# Patient Record
Sex: Female | Born: 1955 | Race: Black or African American | Hispanic: No | Marital: Married | Smoking: Never smoker
Health system: Southern US, Community
[De-identification: ages and names within clinical notes are randomized; demographics above are authoritative.]

## PROBLEM LIST (undated history)

## (undated) DIAGNOSIS — F419 Anxiety disorder, unspecified: Secondary | ICD-10-CM

## (undated) DIAGNOSIS — F32A Depression, unspecified: Secondary | ICD-10-CM

## (undated) DIAGNOSIS — M1990 Unspecified osteoarthritis, unspecified site: Secondary | ICD-10-CM

## (undated) DIAGNOSIS — E785 Hyperlipidemia, unspecified: Secondary | ICD-10-CM

## (undated) DIAGNOSIS — K8020 Calculus of gallbladder without cholecystitis without obstruction: Secondary | ICD-10-CM

## (undated) DIAGNOSIS — M069 Rheumatoid arthritis, unspecified: Secondary | ICD-10-CM

## (undated) DIAGNOSIS — I10 Essential (primary) hypertension: Secondary | ICD-10-CM

## (undated) DIAGNOSIS — F329 Major depressive disorder, single episode, unspecified: Secondary | ICD-10-CM

## (undated) DIAGNOSIS — J849 Interstitial pulmonary disease, unspecified: Secondary | ICD-10-CM

## (undated) HISTORY — DX: Essential (primary) hypertension: I10

## (undated) HISTORY — DX: Major depressive disorder, single episode, unspecified: F32.9

## (undated) HISTORY — DX: Interstitial pulmonary disease, unspecified: J84.9

## (undated) HISTORY — DX: Depression, unspecified: F32.A

## (undated) HISTORY — DX: Anxiety disorder, unspecified: F41.9

## (undated) HISTORY — DX: Hyperlipidemia, unspecified: E78.5

## (undated) HISTORY — DX: Rheumatoid arthritis, unspecified: M06.9

---

## 2008-01-22 ENCOUNTER — Emergency Department (HOSPITAL_COMMUNITY): Admission: EM | Admit: 2008-01-22 | Discharge: 2008-01-22 | Payer: Self-pay | Admitting: Emergency Medicine

## 2010-06-03 ENCOUNTER — Emergency Department (HOSPITAL_COMMUNITY)
Admission: EM | Admit: 2010-06-03 | Discharge: 2010-06-03 | Disposition: A | Discharge status: Home / Self Care | Payer: Managed Care, Other (non HMO) | Attending: Emergency Medicine | Admitting: Emergency Medicine

## 2010-06-03 DIAGNOSIS — R059 Cough, unspecified: Secondary | ICD-10-CM | POA: Insufficient documentation

## 2010-06-03 DIAGNOSIS — J069 Acute upper respiratory infection, unspecified: Secondary | ICD-10-CM | POA: Insufficient documentation

## 2010-06-03 DIAGNOSIS — R05 Cough: Secondary | ICD-10-CM | POA: Insufficient documentation

## 2011-02-23 ENCOUNTER — Other Ambulatory Visit: Payer: Self-pay | Admitting: Family Medicine

## 2011-02-23 ENCOUNTER — Ambulatory Visit
Admission: RE | Admit: 2011-02-23 | Discharge: 2011-02-23 | Disposition: A | Discharge status: Home / Self Care | Payer: Managed Care, Other (non HMO) | Source: Ambulatory Visit | Attending: Family Medicine | Admitting: Family Medicine

## 2011-02-23 DIAGNOSIS — R52 Pain, unspecified: Secondary | ICD-10-CM

## 2011-02-23 DIAGNOSIS — R609 Edema, unspecified: Secondary | ICD-10-CM

## 2012-06-19 DIAGNOSIS — R413 Other amnesia: Secondary | ICD-10-CM

## 2012-06-19 DIAGNOSIS — F4323 Adjustment disorder with mixed anxiety and depressed mood: Secondary | ICD-10-CM

## 2013-09-11 ENCOUNTER — Other Ambulatory Visit (HOSPITAL_COMMUNITY): Payer: Managed Care, Other (non HMO) | Attending: Psychiatry | Admitting: Psychiatry

## 2013-09-11 ENCOUNTER — Encounter (HOSPITAL_COMMUNITY): Payer: Self-pay

## 2013-09-11 DIAGNOSIS — M129 Arthropathy, unspecified: Secondary | ICD-10-CM | POA: Insufficient documentation

## 2013-09-11 DIAGNOSIS — F332 Major depressive disorder, recurrent severe without psychotic features: Secondary | ICD-10-CM | POA: Insufficient documentation

## 2013-09-11 DIAGNOSIS — F410 Panic disorder [episodic paroxysmal anxiety] without agoraphobia: Secondary | ICD-10-CM

## 2013-09-11 DIAGNOSIS — F411 Generalized anxiety disorder: Secondary | ICD-10-CM | POA: Insufficient documentation

## 2013-09-11 DIAGNOSIS — IMO0001 Reserved for inherently not codable concepts without codable children: Secondary | ICD-10-CM | POA: Insufficient documentation

## 2013-09-11 DIAGNOSIS — G4700 Insomnia, unspecified: Secondary | ICD-10-CM | POA: Insufficient documentation

## 2013-09-11 DIAGNOSIS — I10 Essential (primary) hypertension: Secondary | ICD-10-CM | POA: Insufficient documentation

## 2013-09-11 DIAGNOSIS — F339 Major depressive disorder, recurrent, unspecified: Secondary | ICD-10-CM

## 2013-09-11 MED ORDER — MIRTAZAPINE 15 MG PO TABS: 15.0000 mg | ORAL_TABLET | Freq: Every day | ORAL | Status: DC

## 2013-09-11 NOTE — Progress Notes (Unsigned)
    Daily Group Progress Note  Program: IOP  Group Time: 9:00-10:30  Participation Level: Active  Behavioral Response: Appropriate  Type of Therapy:  Group Therapy  Summary of Progress: Pt. Met with psychiatrist and case manager.     Group Time: 10:30-12:00  Participation Level:  Active  Behavioral Response: Appropriate  Type of Therapy: Psycho-education Group  Summary of Progress: Pt. Met with Pt. And case Freight forwarder.  Nancie Neas, COUNS

## 2013-09-11 NOTE — Progress Notes (Deleted)
Ruth Brown is a 58 y.o., married, African American female, who was referred by a previous MH-IOP patient.

## 2013-09-12 ENCOUNTER — Other Ambulatory Visit (HOSPITAL_COMMUNITY): Payer: Managed Care, Other (non HMO) | Admitting: Psychiatry

## 2013-09-12 ENCOUNTER — Encounter (HOSPITAL_COMMUNITY): Payer: Self-pay | Admitting: Psychiatry

## 2013-09-12 DIAGNOSIS — F339 Major depressive disorder, recurrent, unspecified: Secondary | ICD-10-CM
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
## 2013-09-12 NOTE — Progress Notes (Signed)
    Daily Group Progress Note  Program: IOP  Group Time: 9:00-10:30  Participation Level: Active  Behavioral Response: Appropriate  Type of Therapy:  Group Therapy  Summary of Progress: Pt. Smiled and laughed appropriately. Pt. Discussed family history of mental illness, efforts she has made to understand her family and manage her depression.     Group Time: 10:30-12:00  Participation Level:  Active  Behavioral Response: Appropriate  Type of Therapy: Psycho-education Group  Summary of Progress: Pt. Discussed use of self-compassion and watched Clovia Cuff video.  [facility]

## 2013-09-15 ENCOUNTER — Other Ambulatory Visit (HOSPITAL_COMMUNITY): Payer: Managed Care, Other (non HMO) | Admitting: Psychiatry

## 2013-09-15 DIAGNOSIS — F339 Major depressive disorder, recurrent, unspecified: Secondary | ICD-10-CM

## 2013-09-15 NOTE — Progress Notes (Signed)
    Daily Group Progress Note  Program: IOP  Group Time: 0900-1030  Participation Level: Active  Behavioral Response: Appropriate  Type of Therapy:  Process Group  Summary of Progress: Patient voiced how her stressors has affected her both mentally and physically.  Patient states she has determined that she will put herself first.  "I need to work on me." Patient was very tearful at the beginning of group.     Group Time: 1045-1200  Participation Level:  Active  Behavioral Response: Appropriate  Type of Therapy: Psycho-education Group  Summary of Progress: Discussed setting treatment goals, assessing current resources; while exploring new ones, safety plans once discharged, and arranging aftercare plans.       [facility]

## 2013-09-16 ENCOUNTER — Other Ambulatory Visit (HOSPITAL_COMMUNITY): Payer: Managed Care, Other (non HMO) | Admitting: Psychiatry

## 2013-09-16 ENCOUNTER — Encounter (HOSPITAL_COMMUNITY): Payer: Self-pay | Admitting: Psychiatry

## 2013-09-16 DIAGNOSIS — F339 Major depressive disorder, recurrent, unspecified: Secondary | ICD-10-CM

## 2013-09-17 ENCOUNTER — Encounter (HOSPITAL_COMMUNITY): Payer: Self-pay | Admitting: Psychiatry

## 2013-09-17 ENCOUNTER — Other Ambulatory Visit (HOSPITAL_COMMUNITY): Payer: Managed Care, Other (non HMO) | Admitting: Psychiatry

## 2013-09-17 NOTE — Progress Notes (Signed)
    Daily Group Progress Note  Program: IOP  Group Time: 9:00-10:30  Participation Level: Active  Behavioral Response: Appropriate  Type of Therapy:  Group Therapy  Summary of Progress: Pt. Reported that she was in the process of accepting her depression diagnosis. Pt. Also discussed progress that she has made in accepting her mother and creating healthy boundaries in her relationship with her mother.     Group Time:  10:30-12:00  Participation Level:  Active  Behavioral Response: Appropriate  Type of Therapy: Psycho-education Group  Summary of Progress: Pt. Participated in discussion about identifying cognitive distortions and rewriting them with more accurate statements.  Shaune Pollack, COUNS

## 2013-09-17 NOTE — Progress Notes (Signed)
    Daily Group Progress Note  Program: IOP  Group Time: 9:00-10:30  Participation Level: Active  Behavioral Response: Appropriate  Type of Therapy:  Group Therapy  Summary of Progress: Pt. Continues to demonstrate positive attitude toward the group therapy process, learning about depression, thought processes, and behavioral changes that she can make to support her mood. Pt. Shared daughter's poetry with the group.     Group Time: 10:30-12:00  Participation Level:  Active  Behavioral Response: Appropriate  Type of Therapy: Psycho-education Group  Summary of Progress: Pt. Was alert and attentive during grief and loss facilitated by Theda Belfast.  Shaune Pollack, COUNS

## 2013-09-18 ENCOUNTER — Other Ambulatory Visit (HOSPITAL_COMMUNITY): Payer: Managed Care, Other (non HMO) | Admitting: Psychiatry

## 2013-09-18 DIAGNOSIS — F339 Major depressive disorder, recurrent, unspecified: Secondary | ICD-10-CM

## 2013-09-19 ENCOUNTER — Encounter (HOSPITAL_COMMUNITY): Payer: Self-pay | Admitting: Psychiatry

## 2013-09-19 ENCOUNTER — Other Ambulatory Visit (HOSPITAL_COMMUNITY): Payer: Managed Care, Other (non HMO) | Admitting: Psychiatry

## 2013-09-19 ENCOUNTER — Telehealth (HOSPITAL_COMMUNITY): Payer: Self-pay | Admitting: Psychiatry

## 2013-09-19 NOTE — Progress Notes (Signed)
    Daily Group Progress Note  Program: IOP  Group Time: 9:00-10:30  Participation Level: Active  Behavioral Response: Appropriate  Type of Therapy:  Group Therapy  Summary of Progress: Pt. Participated in meditation practice. Pt. Reported that she was feeling "good" with thumbs up. Pt. Reported that her sleep medications have started to work and she is relieved to be sleeping well.      Group Time: 10:30-12:00  Participation Level:  Active  Behavioral Response: Appropriate  Type of Therapy: Psycho-education Group  Summary of Progress: Pt. Was alert and attentive during discussion about developing sleep hygiene behaviors and the importance of sleep to mental health.  [name], [name], COUNS
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
## 2013-09-22 ENCOUNTER — Encounter (HOSPITAL_COMMUNITY): Payer: Self-pay | Admitting: Psychiatry

## 2013-09-22 ENCOUNTER — Other Ambulatory Visit (HOSPITAL_COMMUNITY): Payer: Managed Care, Other (non HMO) | Admitting: Psychiatry

## 2013-09-22 DIAGNOSIS — F339 Major depressive disorder, recurrent, unspecified: Secondary | ICD-10-CM

## 2013-09-22 NOTE — Progress Notes (Signed)
    Daily Group Progress Note  Program: IOP  Group Time: 9:00-10:30  Participation Level: Active  Behavioral Response: Appropriate  Type of Therapy:  Group Therapy  Summary of Progress: Pt. Participated in meditation exercise. Pt. Shared thumbs up that she was feeling "happy". Pt. Shared that she was continuing to set healthy relationship boundaries with her son and grieve the loss related to his imprisonment.     Group Time: 10:30-12:00  Participation Level:  Active  Behavioral Response: Appropriate  Type of Therapy: Psycho-education Group  Summary of Progress: Pt. Participated in discussion about worksheet "just for today..the choice is mine."  Home DepotBrown, SCANA CorporationJennifer Brown, [address] Tilgh[address] RoadOUNS

## 2013-09-23 ENCOUNTER — Other Ambulatory Visit (HOSPITAL_COMMUNITY): Payer: Managed Care, Other (non HMO) | Admitting: Psychiatry

## 2013-09-23 ENCOUNTER — Encounter (HOSPITAL_COMMUNITY): Payer: Self-pay | Admitting: Psychiatry

## 2013-09-23 NOTE — Progress Notes (Signed)
    Daily Group Progress Note  Program: IOP  Group Time: 9:00-10:30  Participation Level: Active  Behavioral Response: Appropriate  Type of Therapy:  Group Therapy  Summary of Progress: Pt. Shared that she was feeling "pretty good". Pt. Stated that she was feeling some anxiety about discharge the end of the week and returning to work. Pt. Continues to focus on understanding the nature of her depression and focusing on positive coping skills such as cooking, cleaning, and improving her communication with her family.     Group Time: 10:30-12:00  Participation Level:  Active  Behavioral Response: Appropriate  Type of Therapy: Psycho-education Group  Summary of Progress: Pt. Was alert and attentive during grief and loss group facilitated by Theda BelfastBob Hamilton.  [name], [name], COUNS

## 2013-09-24 ENCOUNTER — Encounter (HOSPITAL_COMMUNITY): Payer: Self-pay | Admitting: Psychiatry

## 2013-09-24 ENCOUNTER — Other Ambulatory Visit (HOSPITAL_COMMUNITY): Payer: Managed Care, Other (non HMO) | Admitting: Psychiatry

## 2013-09-24 DIAGNOSIS — F339 Major depressive disorder, recurrent, unspecified: Secondary | ICD-10-CM

## 2013-09-24 NOTE — Progress Notes (Signed)
    Daily Group Progress Note  Program: IOP  Group Time: 9:00-10:30  Participation Level: Active  Behavioral Response: Appropriate  Type of Therapy:  Group Therapy  Summary of Progress: Pt. Reported that she was feeling good. Pt. Shared fruit with the group because she wanted to be kind without the act of kindness being discovered by the group. Pt. Continues to engage in positive coping behaviors such as developing healthy boundaries with her children and learning from her past behavior. Pt. Shares positive feedback with the group with examples of how their experiences have helped her such as being aware of over apologizing for her behavior.   Group Time: 10:30-12:00  Participation Level:  Active  Behavioral Response: Appropriate  Type of Therapy: Psycho-education Group  Summary of Progress: Pt. Participated in discussion about self-esteem development and resisting comparison to others. Pt. Participated in reflection reading and meditation practice.  [name], [name], COUNS

## 2013-09-25 ENCOUNTER — Encounter (HOSPITAL_COMMUNITY): Payer: Self-pay | Admitting: Psychiatry

## 2013-09-25 ENCOUNTER — Other Ambulatory Visit (HOSPITAL_COMMUNITY): Payer: Managed Care, Other (non HMO) | Admitting: Psychiatry

## 2013-09-25 NOTE — Progress Notes (Signed)
    Daily Group Progress Note  Program: IOP  Group Time: 9:00-10:30  Participation Level: Active  Behavioral Response: Appropriate  Type of Therapy:  Group Therapy  Summary of Progress: Pt. Reported that she continues to have a positive attitude, reports reduction in tendency to fault find. Pt. Discussed desire to find help for her son who has marijuana dependency. Pt. Continuing to work on improving communication with her family and looking forward to returning to work next week.     Group Time: 10:30-12:00  Participation Level:  Active  Behavioral Response: Appropriate  Type of Therapy: Psycho-education Group  Summary of Progress: Pt. Was alert and attentive during group facilitated by the mental health association.  [name], [name], COUNS

## 2013-09-26 ENCOUNTER — Other Ambulatory Visit (HOSPITAL_COMMUNITY): Payer: Managed Care, Other (non HMO) | Admitting: Psychiatry

## 2013-09-26 ENCOUNTER — Encounter (HOSPITAL_COMMUNITY): Payer: Self-pay | Admitting: Psychiatry

## 2013-09-26 ENCOUNTER — Other Ambulatory Visit (HOSPITAL_COMMUNITY): Payer: Managed Care, Other (non HMO)

## 2013-09-26 DIAGNOSIS — F331 Major depressive disorder, recurrent, moderate: Secondary | ICD-10-CM

## 2013-09-26 MED ORDER — MIRTAZAPINE 15 MG PO TABS: 15.0000 mg | ORAL_TABLET | Freq: Every day | ORAL | Status: DC

## 2013-09-26 NOTE — Patient Instructions (Signed)
Patient completed MH-IOP today.  Will follow up with Dr. Leonarda SalonMakinson 09-26-13 @ 5pm and Dr. Lolly MustacheArfeen on 10-09-13 @ 1:15pm.  Encouraged support groups.  Return to work on 10-06-13, without any restrictions.
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
## 2013-09-26 NOTE — Addendum Note (Signed)
Addended by: Margit BandaADEPALLI, GAYATHRI D on: 09/26/2013 01:22 PM   Modules accepted: Orders

## 2013-09-26 NOTE — Progress Notes (Signed)
    Daily Group Progress Note  Program: IOP  Group Time: 9:00-10:30  Participation Level: Active  Behavioral Response: Appropriate  Type of Therapy:  Group Therapy  Summary of Progress: Pt. Reported that she continues to feel good, has positive attitude, and mindful about tendency to be fault finding. Pt. Excited about moving into a new home and planning to be busy fixing up the home until her return to work. Pt. Expressed confidence that she would be able to take her positive attitude and coping skills into her work environment.     Group Time: 10:30-12:00  Participation Level:  Active  Behavioral Response: Appropriate  Type of Therapy: Psycho-education Group  Summary of Progress: Pt. Was alert and attentive during discussion on developing assertiveness and nonverbal communication; watched Amy Cuddy video.  [name], [name], COUNS
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
## 2013-09-26 NOTE — Telephone Encounter (Signed)
D:  Placed call to [employer] [phone]), spoke to ThiellsJacuelyn, who referred writer to Fritzi MandesKirsten re:  Extension.  According to Moshe SalisburyKirsten, FMLA will follow suit with whatever short term disability approves.  STD approved thru 10-05-13.  Kirsten stated she would send a message to the benefit manager, letting him know that [facility] is recommending pt to continue to be out of work through 10-05-13, with RTW on 10-06-13.  She stated that nothing is required from [facility].  A:  Informed pt.  R:  Pt receptive.

## 2013-09-29 ENCOUNTER — Other Ambulatory Visit (HOSPITAL_COMMUNITY): Payer: Managed Care, Other (non HMO)

## 2013-09-30 ENCOUNTER — Other Ambulatory Visit (HOSPITAL_COMMUNITY): Payer: Managed Care, Other (non HMO)

## 2013-10-01 ENCOUNTER — Other Ambulatory Visit (HOSPITAL_COMMUNITY): Payer: Managed Care, Other (non HMO)

## 2013-10-02 ENCOUNTER — Other Ambulatory Visit (HOSPITAL_COMMUNITY): Payer: Managed Care, Other (non HMO)

## 2013-10-03 ENCOUNTER — Other Ambulatory Visit (HOSPITAL_COMMUNITY): Payer: Managed Care, Other (non HMO)

## 2013-10-06 ENCOUNTER — Other Ambulatory Visit (HOSPITAL_COMMUNITY): Payer: Managed Care, Other (non HMO)

## 2013-10-07 ENCOUNTER — Other Ambulatory Visit (HOSPITAL_COMMUNITY): Payer: Managed Care, Other (non HMO)

## 2013-10-08 ENCOUNTER — Other Ambulatory Visit (HOSPITAL_COMMUNITY): Payer: Managed Care, Other (non HMO)

## 2013-10-09 ENCOUNTER — Encounter (HOSPITAL_COMMUNITY): Payer: Self-pay | Admitting: Psychiatry

## 2013-10-09 ENCOUNTER — Other Ambulatory Visit (HOSPITAL_COMMUNITY): Payer: Managed Care, Other (non HMO)

## 2013-10-09 ENCOUNTER — Ambulatory Visit (INDEPENDENT_AMBULATORY_CARE_PROVIDER_SITE_OTHER): Payer: Managed Care, Other (non HMO) | Admitting: Psychiatry

## 2013-10-09 VITALS — BP 126/84 | HR 85 | Ht 61.0 in | Wt 166.0 lb

## 2013-10-09 DIAGNOSIS — F339 Major depressive disorder, recurrent, unspecified: Secondary | ICD-10-CM

## 2013-10-09 DIAGNOSIS — F331 Major depressive disorder, recurrent, moderate: Secondary | ICD-10-CM

## 2013-10-09 DIAGNOSIS — F411 Generalized anxiety disorder: Secondary | ICD-10-CM

## 2013-10-09 NOTE — Progress Notes (Signed)
[facility] Initial Assessment Note  Ruth SaugerSylvia D Brown [id number] 58 y.o.  10/09/2013 1:54 PM  Chief Complaint:  I was feeling better but I'm anxious again because my son back to in jail.  History of Present Illness:  Patient is a 58 year old African American female who was referred from intensive outpatient program.  Patient recently finished a program from June 4 to June 19.  She was self-referred because of increased depression and anxiety symptoms.  She was also having panic attacks.  She has multiple stressors.  Her son was arrested for bank robbery and he is in and out of jail, patient is working at [employer] and lately her job has been very stressful.  There are a lot of changes going on and she has been written up and she is very scared about her future.  She has to move out from her home in mid March because the landlord wanting her home to be section 8.  She has to live with her older daughter recently she had her own place.  She was complaining of hopelessness, depressed mood, having panic symptoms, irritability, lack of sleep and anhedonia.  She was started Celexa by her primary care physician 2 years ago but lately she felt that medicines are working..  During intensive outpatient program she was given Remeron.  She is taking the Remeron and she is feeling her sleep and anxiety is better.  She was able to restart her work on Monday however her son was picked up by police and FBI again and currently he is in the jail.  Patient is trying to bail him out however she does not have enough money.  Patient reported that her son may require 3-4 years in jail.  The patient endorsed that she has been more anxious in the past 2 days and there are times that she she was unable to function at work.  She has taken a day off because she was unable to function and very scared that she may lose her job.  Overall she felt that Remeron is helping her anxiety.  She is sleeping better.  She  denies any major panic attack.  She is to have symptoms of anxiety and nervousness but denies any active or passive suicidal thoughts or homicidal thoughts.  She denies any agitation, anger, mood swing.  She has no side effects of medication.  Her appetite is okay.  Patient is seeing [name] for counseling.  She lives with her husband and 56 year old daughter.  Suicidal Ideation: No Plan Formed: No Patient has means to carry out plan: No  Homicidal Ideation: No Plan Formed: No Patient has means to carry out plan: No  Past Psychiatric History/Hospitalization(s) Patient denies any previous history of psychiatric inpatient treatment.  She was taking Celexa she felt it stopped working.  In the past she had tried Paxil but it did not work.  Patient denies any history of mania, psychosis, hallucination, paranoia or any aggression.  She denies any history of nightmares and flashback.  She finished intensive outpatient program from June 4 June 19th, 2015.  Patient has a history of drug use are any alcohol use. Anxiety: Yes Bipolar Disorder: No Depression: Yes Mania: No Psychosis: No Schizophrenia: No Personality Disorder: No Hospitalization for psychiatric illness: No History of Electroconvulsive Shock Therapy: No Prior Suicide Attempts: No  Medical History; Patient has history of hypertension, obesity and hyperlipidemia.  Her primary care physician is Dr. Alen Blewictor.  Traumatic brain injury: Patient denies  any history of traumatic brain injury.  Family History; Patient endorsed history of alcoholism in her family.  Both of her parents are alcoholic.  Education and Work History; The patient has education from technical school.  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  Psychosocial History; Patient was born [place].  She moved to [place] in [place].  She has 3 children.  Her oldest daughter is from her first relationship.  She  has 16-year- daughter old and 383 year old son from her current husband.  Her son has been in and out from jail.  Legal History; Patient denies any legal issues or any legal history.  History Of Abuse; Patient denies any history of physical or emotional abuse.  Substance Abuse History; Patient denies any history of alcohol use or any illegal substance use.   Review of Systems: Psychiatric: Agitation: No Hallucination: No Depressed Mood: No Insomnia: No Hypersomnia: No Altered Concentration: No Feels Worthless: No Grandiose Ideas: No Belief In Special Powers: No New/Increased Substance Abuse: No Compulsions: No  Neurologic: Headache: No Seizure: No Paresthesias: No    Outpatient Encounter Prescriptions as of 10/09/2013  Medication Sig  . cyclobenzaprine (FLEXERIL) 5 MG tablet Take 5 mg by mouth 3 (three) times daily as needed for muscle spasms.  . diclofenac (VOLTAREN) 75 MG EC tablet Take 75 mg by mouth 2 (two) times daily.  [name]. lisinopril-hydrochlorothiazide (PRINZIDE,ZESTORETIC) 10-12.5 MG per tablet Take 1 tablet by mouth daily.  . mirtazapine (REMERON) 15 MG tablet Take 1 tablet (15 mg total) by mouth at bedtime.  . simvastatin (ZOCOR) 20 MG tablet Take 20 mg by mouth daily.    No results found for this or any previous visit (from the past 2160 hour(s)).    Physical Exam: Constitutional:  BP 126/84  Pulse 85  Ht 5\' 1"  (1.549 m)  Wt 166 lb (75.297 kg)  BMI 31.38 kg/m2  Musculoskeletal: Strength & Muscle Tone: within normal limits Gait & Station: normal Patient leans: N/A  Mental Status Examination;  Patient is casually dressed and fairly groomed.  Her speech is fast but coherent.  Her thought processes logical and goal-directed.  She denies any auditory or visual hallucination.  However she is rumination about her stressors.  There were no delusions, paranoia or any obsessive thoughts.  Her attention and concentration is fair.  Her psychomotor activity is  slightly increased.  Her fund of knowledge is adequate.  She described her mood as anxious and her affect is constricted.  She maintained fair eye contact.  She is alert and oriented x3.  Her insight judgment and impulse control is okay.   Established Problem, Stable/Improving (1), New problem, with additional work up planned, Review of Psycho-Social Stressors (1), Review or order clinical lab tests (1), Decision to obtain old records (1), Review and summation of old records (2), Established Problem, Worsening (2), Review of Medication Regimen & Side Effects (2) and Review of New Medication or Change in Dosage (2)  Assessment: Axis I: Maj. depressive disorder, recurrent.  Generalized anxiety disorder  Axis II: Deferred  Axis III:  Past Medical History  Diagnosis Date  . Anxiety   . Depression   . HTN (hypertension)   . Hyperlipidemia     Axis IV: Moderate   Plan:  I reviewed her symptoms, collateral information, psychosocial stressors, current medication and records from intensive outpatient program.  She is taking Remeron 15 mg at bedtime.  She has  seen improvement with the medication.  She still have a lot of psychosocial issues related to her 12 year old son who is currently in jail.  She also endorsed stress coming from work and some days she is overwhelmed.  She had missed the day from the work because she could not able to function.  I strongly suggested that FMLA should be completed because it will help to protect her job and she can come to doctor's appointment.  Patient is also seeing Lala Lund for counseling.  I recommended to call us back if she is any question or any concern.  I would continue Remeron at present dose.  Risk and benefit discussed in detail with the patient.  I will see her again in 3 weeks.  Time spent 55 minutes.  Patient has enough Remeron at this time and she will call us if she needed a refill. Time spent 55 minutes.  More than 50% of the time spent in  psychoeducation, counseling and coordination of care.  Discuss safety plan that anytime having active suicidal thoughts or homicidal thoughts then patient need to call 911 or go to the local emergency room.    ARFEEN,SYED T., MD 10/09/2013

## 2013-10-13 ENCOUNTER — Other Ambulatory Visit (HOSPITAL_COMMUNITY): Payer: Managed Care, Other (non HMO)

## 2013-10-14 ENCOUNTER — Other Ambulatory Visit (HOSPITAL_COMMUNITY): Payer: Managed Care, Other (non HMO)

## 2013-10-15 ENCOUNTER — Other Ambulatory Visit (HOSPITAL_COMMUNITY): Payer: Managed Care, Other (non HMO)

## 2013-10-16 ENCOUNTER — Other Ambulatory Visit (HOSPITAL_COMMUNITY): Payer: Managed Care, Other (non HMO)

## 2013-10-17 ENCOUNTER — Other Ambulatory Visit (HOSPITAL_COMMUNITY): Payer: Managed Care, Other (non HMO)

## 2013-10-20 ENCOUNTER — Other Ambulatory Visit (HOSPITAL_COMMUNITY): Payer: Managed Care, Other (non HMO)

## 2013-10-21 ENCOUNTER — Other Ambulatory Visit (HOSPITAL_COMMUNITY): Payer: Managed Care, Other (non HMO)

## 2013-10-22 ENCOUNTER — Other Ambulatory Visit (HOSPITAL_COMMUNITY): Payer: Managed Care, Other (non HMO)

## 2013-10-29 ENCOUNTER — Ambulatory Visit (INDEPENDENT_AMBULATORY_CARE_PROVIDER_SITE_OTHER): Payer: Managed Care, Other (non HMO) | Admitting: Psychiatry

## 2013-10-29 ENCOUNTER — Encounter (HOSPITAL_COMMUNITY): Payer: Self-pay | Admitting: Psychiatry

## 2013-10-29 VITALS — BP 118/72 | HR 90 | Ht 61.0 in | Wt 168.0 lb

## 2013-10-29 DIAGNOSIS — F331 Major depressive disorder, recurrent, moderate: Secondary | ICD-10-CM

## 2013-10-29 MED ORDER — MIRTAZAPINE 30 MG PO TABS: 30.0000 mg | ORAL_TABLET | Freq: Every day | ORAL | Status: DC

## 2013-10-29 NOTE — Progress Notes (Signed)
[facility] [id number] Progress Note   Ruth Brown [id number] 58 y.o.  10/29/2013 10:36 AM  Chief Complaint:  I am not sleeping well.  I'm only sleeping 2-3 hours.  My job is very stressful.  History of Present Illness:  Ruth Brown came for her followup appointment.  She was seen first time on July 2.  She was referred from the intensive outpatient program.  She mentioned that she started her job but she is very concerned at work.  She does not have access to computer and she is unable to do anything.  She is only listening to the phone calls.  She admitted some days she is going to be late because she is very nervous and anxious.  She sleeping 2-3 hours.  She continued to endorse anxiety and panic attack.  She admitted crying spells and racing thoughts.  She wanted to extend her FMLA however she was told that her FMLA is exhausted and she might need a new letter.  She has contact her primary care physician Dr. Alen Brown to complete the paperwork.  She admitted some time like a focus, lack of attention and feeling hopeless.  Her son is still in the jail however they may move to him to [place].  Patient is relieved because his unable to get some programming and able to go school then he moved to [place].  Her son was arrested for bank robbery.  Patient denies any side effects of medication but she also feels that current dosage is not helping.  She endorsed anhedonia, feelings of hopelessness and worthlessness.  She denies any active or passive suicidal thoughts or homicidal thoughts but she described crying spells, sad and depressed mood.  She denies any agitation or any anger.  She denies any hallucination.  Her appetite is okay.  Her vitals are stable.  She has no tremors or shakes.  She is seeing Ruth Brown for counseling.  She lives with her husband and 27 year old daughter.  Her husband is very supportive.  [name]    Suicidal Ideation: No  Plan Formed: No Patient has means to carry out plan: No  Homicidal Ideation: No Plan Formed: No Patient has means to carry out plan: No  Past Psychiatric History/Hospitalization(s) Patient has history of depression.  In the past she had tried Celexa and Paxil but stopped because it was not working.  Patient denies any history of mania, psychosis, hallucination, nightmares, flashback , paranoia or any aggression.  She had finished intensive outpatient program from June 4 June 19th, 2015.   Anxiety: Yes Bipolar Disorder: No Depression: Yes Mania: No Psychosis: No Schizophrenia: No Personality Disorder: No Hospitalization for psychiatric illness: No History of Electroconvulsive Shock Therapy: No Prior Suicide Attempts: No  Medical History; Patient has history of hypertension, obesity and hyperlipidemia.  Her primary care physician is Dr. Alen Brown.  Her last blood work was in May 2015.  Review of Systems  Constitutional: Positive for malaise/fatigue.  Psychiatric/Behavioral: Positive for depression. The patient is nervous/anxious and has insomnia.    Psychiatric: Agitation: No Hallucination: No Depressed Mood: No Insomnia: No Hypersomnia: No Altered Concentration: No Feels Worthless: No Grandiose Ideas: No Belief In Special Powers: No New/Increased Substance Abuse: No Compulsions: No  Neurologic: Headache: No Seizure: No Paresthesias: No    Outpatient Encounter Prescriptions as of 10/29/2013  Medication Sig  . cyclobenzaprine (FLEXERIL) 5 MG tablet Take 5 mg by mouth 3 (three) times daily as needed  for muscle spasms.  . diclofenac (VOLTAREN) 75 MG EC tablet Take 75 mg by mouth 2 (two) times daily.  [name]. lisinopril-hydrochlorothiazide (PRINZIDE,ZESTORETIC) 10-12.5 MG per tablet Take 1 tablet by mouth daily.  . mirtazapine (REMERON) 30 MG tablet Take 1 tablet (30 mg total) by mouth at bedtime.  . simvastatin (ZOCOR) 20 MG tablet Take 20 mg by mouth daily.  . [DISCONTINUED]  mirtazapine (REMERON) 15 MG tablet Take 1 tablet (15 mg total) by mouth at bedtime.    No results found for this or any previous visit (from the past 2160 hour(s)).    Physical Exam: Constitutional:  BP 118/72  Pulse 90  Ht 5\' 1"  (1.549 m)  Wt 168 lb (76.204 kg)  BMI 31.76 kg/m2  Musculoskeletal: Strength & Muscle Tone: within normal limits Gait & Station: normal Patient leans: N/A  Mental Status Examination;  Patient is casually dressed and fairly groomed.  Her speech is fast but coherent.  Her thought processes logical and goal-directed.  She denies any auditory or visual hallucination.  She described her mood as anxious depressed and her affect is constricted.  There were no delusions, paranoia or any obsessive thoughts.  Her attention and concentration is fair.  Her psychomotor activity is slightly increased.  Her fund of knowledge is adequate. She maintained fair eye contact.  She is alert and oriented x3.  Her insight judgment and impulse control is okay.   Review of Psycho-Social Stressors (1), Established Problem, Worsening (2), Review of Last Therapy Session (1), Review of Medication Regimen & Side Effects (2) and Review of New Medication or Change in Dosage (2)  Assessment: Axis I: Maj. depressive disorder, recurrent.  Generalized anxiety disorder  Axis II: Deferred  Axis III:  Past Medical History  Diagnosis Date  . Anxiety   . Depression   . HTN (hypertension)   . Hyperlipidemia     Axis IV: Moderate   Plan:  The patient still has symptoms of depression and anxiety.  She is taking Remeron 15 mg at bedtime however she is unable to sleep very well.  She still has difficulty at work.  She had applied to extend her FMLA however she was told that her FMLA is exhausted.  She needed a letter to her on a flexible schedule and currently she has requested her primary care physician Dr. Alen Blewictor to provide a letter.  The patient denies any side effects of medication.  I  will increase Remeron 30 mg at bedtime to help insomnia, anxiety and depressive symptoms.  Recommended to keep appointment with her therapist Ruth AmasLinda Makinson for counseling.  Recommended to call us back if she has any question or any concern.  Followup in 4 weeks.  Patient acquired a 90 day prescription of Remeron. Time spent 25 minutes.  More than 50% of the time spent in psychoeducation, counseling and coordination of care.  Discuss safety plan that anytime having active suicidal thoughts or homicidal thoughts then patient need to call 911 or go to the local emergency room.    Natalie Leclaire T., MD 10/29/2013

## 2013-11-26 ENCOUNTER — Encounter (HOSPITAL_COMMUNITY): Payer: Self-pay | Admitting: Psychiatry

## 2013-11-26 ENCOUNTER — Ambulatory Visit (INDEPENDENT_AMBULATORY_CARE_PROVIDER_SITE_OTHER): Payer: Managed Care, Other (non HMO) | Admitting: Psychiatry

## 2013-11-26 VITALS — BP 124/74 | HR 108 | Ht 60.0 in | Wt 171.6 lb

## 2013-11-26 DIAGNOSIS — F331 Major depressive disorder, recurrent, moderate: Secondary | ICD-10-CM

## 2013-11-26 DIAGNOSIS — F339 Major depressive disorder, recurrent, unspecified: Secondary | ICD-10-CM

## 2013-11-26 DIAGNOSIS — F411 Generalized anxiety disorder: Secondary | ICD-10-CM

## 2013-11-26 NOTE — Progress Notes (Signed)
[facility] [id number] Progress Note   Ruth SaugerSylvia D Brown [id number] 58 y.o.  11/26/2013 1:29 PM  Chief Complaint:  Medication management and followup.    History of Present Illness:  Ruth Brown came for her followup appointment.  On her last visit we increase Remeron to 30 mg.  She is doing better.  She is sleeping better.  She started working full time however there are times she needed breaks for her anxiety.  She denies any side effects of medication.  She is relieved that [place] and [place].  Her son started the program in the prison and she is happy about it.  Her daughter may excepted in internship in business firm.  Overall she believed things are getting better.  She denies any feelings of hopelessness, crying spells or any irritability.  She likes the Remeron.  She was to keep her FMLA because of doctor's appointment and episodic flareups.  Her FMLA was done by her primary care physician however she mentioned it was not done right.  She denies any hallucination, paranoia or any anger.  She is seeing Ruth LundLinda  Brown for counseling.  Her appetite is good.  Her vitals are stable.  She lives with her husband and 56 year old daughter.  Her husband is very supportive.  She is working at [employer] for more than 5 years.    Suicidal Ideation: No Plan Formed: No Patient has means to carry out plan: No  Homicidal Ideation: No Plan Formed: No Patient has means to carry out plan: No  Past Psychiatric History/Hospitalization(s) Patient has history of depression.  In the past she had tried Celexa and Paxil but stopped because it was not working.  Patient denies any history of mania, psychosis, hallucination, nightmares, flashback , paranoia or any aggression.  She had finished intensive outpatient program from June 4 June 19th, 2015.   Anxiety: Yes Bipolar Disorder: No Depression: Yes Mania: No Psychosis: No Schizophrenia: No Personality  Disorder: No Hospitalization for psychiatric illness: No History of Electroconvulsive Shock Therapy: No Prior Suicide Attempts: No  Medical History; Patient has history of hypertension, obesity and hyperlipidemia.  Her primary care physician is Dr. Alen Brown.  Her last blood work was in May 2015.  ROS Psychiatric: Agitation: No Hallucination: No Depressed Mood: No Insomnia: No Hypersomnia: No Altered Concentration: No Feels Worthless: No Grandiose Ideas: No Belief In Special Powers: No New/Increased Substance Abuse: No Compulsions: No  Neurologic: Headache: No Seizure: No Paresthesias: No    Outpatient Encounter Prescriptions as of 11/26/2013  Medication Sig  . cyclobenzaprine (FLEXERIL) 5 MG tablet Take 5 mg by mouth 3 (three) times daily as needed for muscle spasms.  . diclofenac (VOLTAREN) 75 MG EC tablet Take 75 mg by mouth 2 (two) times daily.  [name]. lisinopril-hydrochlorothiazide (PRINZIDE,ZESTORETIC) 10-12.5 MG per tablet Take 1 tablet by mouth daily.  . mirtazapine (REMERON) 30 MG tablet Take 1 tablet (30 mg total) by mouth at bedtime.  . simvastatin (ZOCOR) 20 MG tablet Take 20 mg by mouth daily.    No results found for this or any previous visit (from the past 2160 hour(s)).    Physical Exam: Constitutional:  BP 124/74  Pulse 108  Ht 5' (1.524 m)  Wt 171 lb 9.6 oz (77.837 kg)  BMI 33.51 kg/m2  Musculoskeletal: Strength & Muscle Tone: within normal limits Gait & Station: normal Patient leans: N/A  Mental Status Examination;  Patient is casually dressed and fairly  groomed.  Her speech is fast but coherent.  Her thought processes logical and goal-directed.  She denies any auditory or visual hallucination.  she described her mood good and her affect is improved from the past.  There were no delusions, paranoia or any obsessive thoughts.  Her attention and concentration is fair.  Her fund of knowledge is adequate. She maintained good eye contact.  She is alert and  oriented x3.  Her insight judgment and impulse control is okay.   Established Problem, Stable/Improving (1), Review of Psycho-Social Stressors (1), Review of Last Therapy Session (1) and Review of Medication Regimen & Side Effects (2)  Assessment: Axis I: Maj. depressive disorder, recurrent.  Generalized anxiety disorder  Axis II: Deferred  Axis III:  Past Medical History  Diagnosis Date  . Anxiety   . Depression   . HTN (hypertension)   . Hyperlipidemia     Axis IV: Moderate   Plan:  Patient is doing better on her current psychotropic medication.  I will continue Remeron 30 mg daily.  We will complete FMLA forms.  She was required 2 hours 2 times a week for another 12 weeks .  Recommended to call Ruth Brown back if she has any question or any concern.   Recommended to keep appointment with her therapist Adelene Amas for counseling. Followup in 2 months.     Micahel Omlor T., MD 11/26/2013

## 2014-01-07 ENCOUNTER — Ambulatory Visit (HOSPITAL_COMMUNITY): Payer: Self-pay | Admitting: Psychiatry

## 2014-03-27 ENCOUNTER — Other Ambulatory Visit (HOSPITAL_COMMUNITY): Payer: Self-pay | Admitting: Psychiatry

## 2014-06-16 DIAGNOSIS — E785 Hyperlipidemia, unspecified: Secondary | ICD-10-CM | POA: Insufficient documentation

## 2014-10-26 ENCOUNTER — Other Ambulatory Visit (HOSPITAL_COMMUNITY): Payer: Managed Care, Other (non HMO) | Attending: Psychiatry | Admitting: Psychiatry

## 2014-10-26 ENCOUNTER — Encounter (HOSPITAL_COMMUNITY): Payer: Self-pay | Admitting: Psychiatry

## 2014-10-26 DIAGNOSIS — E785 Hyperlipidemia, unspecified: Secondary | ICD-10-CM | POA: Diagnosis not present

## 2014-10-26 DIAGNOSIS — I10 Essential (primary) hypertension: Secondary | ICD-10-CM | POA: Diagnosis not present

## 2014-10-26 DIAGNOSIS — F419 Anxiety disorder, unspecified: Secondary | ICD-10-CM | POA: Diagnosis not present

## 2014-10-26 DIAGNOSIS — F329 Major depressive disorder, single episode, unspecified: Secondary | ICD-10-CM | POA: Diagnosis not present

## 2014-10-26 DIAGNOSIS — F4323 Adjustment disorder with mixed anxiety and depressed mood: Secondary | ICD-10-CM | POA: Diagnosis present

## 2014-10-26 MED ORDER — MIRTAZAPINE 15 MG PO TABS: 15.0000 mg | ORAL_TABLET | Freq: Every day | ORAL | Status: DC
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
## 2014-10-26 NOTE — Progress Notes (Signed)
Psychiatric Initial Adult Assessment   Patient Identification: Ruth Brown MRN:  [id number] Date of Evaluation:  10/26/2014 Referral Source: self Chief Complaint:anxiety with some depression   Visit Diagnosis:    ICD-9-CM ICD-10-CM   1. Adjustment disorder with mixed anxiety and depressed mood 309.28 F43.23    Diagnosis:   Patient Active Problem List   Diagnosis Date Noted  . Adjustment disorder with mixed anxiety and depressed mood [F43.23] 10/26/2014    Priority: Medium    Class: Acute   History of Present Illness:  Ruth Brown was in this program last year and has done very well she says.  Her 57 year old daughter got pregnant and has also been accepted to [employer] with a full scholarship.  The plan is to have an abortion and Ruth Brown is supportive of that but all this has increased her anxiety considerably and she needs the support she got in the group last time, she says.  She is happy with how she has handled the situation and believes she has been accepting and supportive to her daughter.  They have a close relationship.  Other stressors are her job and her husband still drinking some as well as her son who is serving time.  The new issue threw things over the edge, she says. Elements:  Location:  situational anxiety. Quality:  trouble sleeping, racing thoughts, trouble staying on task. Severity:  as above. Timing:  daughter pregnant at 78. Duration:  few weeks.. Context:  as above. Associated Signs/Symptoms: Depression Symptoms:  depressed mood, insomnia, fatigue, difficulty concentrating, anxiety, (Hypo) Manic Symptoms:  none Anxiety Symptoms:  Excessive Worry, Psychotic Symptoms:  ;none PTSD Symptoms: Negative  Past Medical History:  Past Medical History  Diagnosis Date  . Anxiety   . Depression   . HTN (hypertension)   . Hyperlipidemia     Past Surgical History  Procedure Laterality Date  . Cesarean section     Family History:  Family History   Problem Relation Age of Onset  . Depression Mother   . Anxiety disorder Mother   . Alcohol abuse Mother   . Depression Maternal Aunt   . Anxiety disorder Maternal Aunt    Social History:   History   Social History  . Marital Status: Married    Spouse Name: N/A  . Number of Children: N/A  . Years of Education: N/A   Social History Main Topics  . Smoking status: Never Smoker   . Smokeless tobacco: Not on file  . Alcohol Use: No  . Drug Use: No  . Sexual Activity: Yes   Other Topics Concern  . None   Social History Narrative   Additional Social History: has a very supportive spiritual life and is close to all her 3 children  Musculoskeletal: Strength & Muscle Tone: within normal limits Gait & Station: normal Patient leans: N/A  Psychiatric Specialty Exam: HPI  ROS  There were no vitals taken for this visit.There is no weight on file to calculate BMI.  General Appearance: Well Groomed  Eye Contact:  Good  Speech:  Clear and Coherent  Volume:  Normal  Mood:  Anxious  Affect:  Congruent  Thought Process:  Coherent and Logical  Orientation:  Full (Time, Place, and Person)  Thought Content:  Negative  Suicidal Thoughts:  No  Homicidal Thoughts:  No  Memory:  Immediate;   Good Recent;   Good Remote;   Good  Judgement:  Intact  Insight:  Good  Psychomotor Activity:  Normal  Concentration:  Good  Recall:  Good  Fund of Knowledge:Good  Language: Good  Akathisia:  Negative  Handed:  Right  AIMS (if indicated):  0  Assets:  Communication Skills Desire for Improvement Financial Resources/Insurance Housing Intimacy Leisure Time Physical Health Resilience Social Support Talents/Skills Transportation Vocational/Educational  ADL's:  Intact  Cognition: WNL  Sleep:  poor   Is the patient at risk to self?  No. Has the patient been a risk to self in the past 6 months?  No. Has the patient been a risk to self within the distant past?  No. Is the patient a  risk to others?  No. Has the patient been a risk to others in the past 6 months?  No. Has the patient been a risk to others within the distant past?  no Allergies:  Not on File Current Medications: Current Outpatient Prescriptions  Medication Sig Dispense Refill  . cyclobenzaprine (FLEXERIL) 5 MG tablet Take 5 mg by mouth 3 (three) times daily as needed for muscle spasms.    [name]. lisinopril-hydrochlorothiazide (PRINZIDE,ZESTORETIC) 10-12.5 MG per tablet Take 1 tablet by mouth daily.    . simvastatin (ZOCOR) 20 MG tablet Take 20 mg by mouth daily.    . mirtazapine (REMERON) 15 MG tablet Take 1 tablet (15 mg total) by mouth at bedtime. 30 tablet 2   No current facility-administered medications for this visit.    Previous Psychotropic Medications: Yes   Substance Abuse History in the last 12 months:  No.  Consequences of Substance Abuse: Negative  Medical Decision Making:  Established Problem, Worsening (2)  Treatment Plan Summary: daily group therapy    Carolanne GrumblingGerald Michoel Kunin 7/18/201611:29 AM

## 2014-10-27 ENCOUNTER — Other Ambulatory Visit (HOSPITAL_COMMUNITY): Payer: Managed Care, Other (non HMO)

## 2014-10-27 NOTE — Progress Notes (Signed)
    Daily Group Progress Note  Program: IOP  Group Time: 9:00-10:30  Participation Level: Active  Behavioral Response: Appropriate  Type of Therapy:  Psycho-education Group  Summary of Progress: Pt. Participated in medication education group with [place].     Group Time: 10:30-12:00  Participation Level:  Active  Behavioral Response: Appropriate  Type of Therapy: Group Therapy  Summary of Progress: Pt. Presented with brightened affect, talkative, makes appropriate eye contact. Pt. Participated in discussion about developing effective self-care, giving self permission to engage in self-care, and identifying patterns of co-dependency in relationships.  [name], Delon Revelo B, LPC

## 2014-10-28 ENCOUNTER — Other Ambulatory Visit (HOSPITAL_COMMUNITY): Payer: Managed Care, Other (non HMO) | Admitting: Psychiatry

## 2014-10-28 DIAGNOSIS — F4323 Adjustment disorder with mixed anxiety and depressed mood: Secondary | ICD-10-CM

## 2014-10-29 ENCOUNTER — Other Ambulatory Visit (HOSPITAL_COMMUNITY): Payer: Managed Care, Other (non HMO) | Admitting: Psychiatry

## 2014-10-29 DIAGNOSIS — F4323 Adjustment disorder with mixed anxiety and depressed mood: Secondary | ICD-10-CM | POA: Diagnosis not present

## 2014-10-29 NOTE — Progress Notes (Signed)
    Daily Group Progress Note  Program: IOP  Group Time: 9:00-10:30  Participation Level: Active  Behavioral Response: Appropriate  Type of Therapy:  Group Therapy  Summary of Progress: Pt. Presents with bright affect. Pt. Was excused from first hour of group due to daughter's medical appointment. Pt. Reports good relationships with her children and supportive relationships with husband, strong family support, and growing self-confidence.      Group Time: 10:30-12:00  Participation Level:  Active  Behavioral Response: Appropriate  Type of Therapy: Psycho-education Group  Summary of Progress: Pt. Watched and discussed Clovia Cuff video about self-compassion and application of self-compassion including mindfulness, kindness to self, and common humanity.   Shaune Pollack, LPC

## 2014-10-30 ENCOUNTER — Other Ambulatory Visit (HOSPITAL_COMMUNITY): Payer: Managed Care, Other (non HMO) | Admitting: Psychiatry

## 2014-10-30 DIAGNOSIS — F4323 Adjustment disorder with mixed anxiety and depressed mood: Secondary | ICD-10-CM

## 2014-10-30 NOTE — Progress Notes (Signed)
    Daily Group Progress Note  Program: IOP  Group Time: 9:00-10:30  Participation Level: Active  Behavioral Response: Appropriate  Type of Therapy:  Group Therapy  Summary of Progress: Pt. Presented with bright affect, talkative. Pt. Discussed feeling more confident with herself and path of acceptance of her mental health diagnosis. Pt. Continues to report her work and family relationships as her biggest stressors. Pt. Was asked the question "What would you do differently if you believed you were enough?" Pt. Responded that she would take more risks.      Group Time: 10:30-12:00  Participation Level:  Active  Behavioral Response: Appropriate  Type of Therapy: Psycho-education Group  Summary of Progress: Pt. Watched and discussed Shawn Achor video about applying positive psychology with behaviors including meditation, use of gratitude, journaling, physical exercise, and random acts of kindness.   Shaune Pollack, LPC

## 2014-11-02 ENCOUNTER — Other Ambulatory Visit (HOSPITAL_COMMUNITY): Payer: Managed Care, Other (non HMO) | Admitting: Psychiatry

## 2014-11-02 DIAGNOSIS — F4323 Adjustment disorder with mixed anxiety and depressed mood: Secondary | ICD-10-CM | POA: Diagnosis not present

## 2014-11-02 NOTE — Progress Notes (Signed)
    Daily Group Progress Note  Program: IOP  Group Time: 9:00-10:30  Participation Level: Active  Behavioral Response: Appropriate  Type of Therapy:  Psycho-education Group  Summary of Progress: Pt. Participated in medication education group with [place].     Group Time: 10:30-12:00  Participation Level:  Active  Behavioral Response: Appropriate  Type of Therapy: Group Therapy  Summary of Progress: Pt. Presented with bright affect, talkative. Pt. Shared that she had a good weekend, more physically active and focused on exercising self-care. Pt. Processed an interaction and discussed how she was able to make a choice that made her feel safe and and emotionally content.   Shaune Pollack, LPC

## 2014-11-02 NOTE — Progress Notes (Signed)
    Daily Group Progress Note  Program: IOP  Group Time: 9:00-10:30  Participation Level: Active  Behavioral Response: Appropriate  Type of Therapy:  Group Therapy  Summary of Progress: Pt. Presented with brightened affect, talkative, provides and receives feedback from group appropriately. Pt. Connected with theme related to anger towards her mother, desire to forgive her mother for insensitivity, and process of forgiveness.      Group Time: 10:30-12:00  Participation Level:  Active  Behavioral Response: Appropriate  Type of Therapy: Psycho-education Group  Summary of Progress: Pt. Participated in instruction about meditation using the RAIN method by Mortimer Fries i.e., Recognize, Allowing, Investigating, and Non-identification.  Shaune Pollack, LPC

## 2014-11-03 ENCOUNTER — Other Ambulatory Visit (HOSPITAL_COMMUNITY): Payer: Managed Care, Other (non HMO)

## 2014-11-04 ENCOUNTER — Other Ambulatory Visit (HOSPITAL_COMMUNITY): Payer: Managed Care, Other (non HMO) | Admitting: Psychiatry

## 2014-11-04 DIAGNOSIS — F4323 Adjustment disorder with mixed anxiety and depressed mood: Secondary | ICD-10-CM

## 2014-11-04 NOTE — Progress Notes (Signed)
    Daily Group Progress Note  Program: IOP  Group Time: 9:00-10:30  Participation Level: Active  Behavioral Response: Appropriate  Type of Therapy:  Group Therapy  Summary of Progress: Pt. Presented as talkative, brightened mood, makes good eye contact, receives appropriate feedback from the group. Pt. Received suggestions regarding support groups with mental health association and [place]. Pt. Reported that she is preparing herself for her last child's departure for college and feels happy that she is mature and ready to leave. Pt. Discussed patterns of emotional eating and participated in discussion about mindful eating and getting in touch with feelings before and after the meal in order to manage her appetite.      Group Time: 10:30-12:00  Participation Level:  Active  Behavioral Response: Appropriate  Type of Therapy: Psycho-education Group  Summary of Progress: Pt. Participated in discussion about recognizing cognitive distortions that disrupt recovery especially tendency toward all-or-nothing thinking.   Shaune Pollack, LPC

## 2014-11-05 ENCOUNTER — Other Ambulatory Visit (HOSPITAL_COMMUNITY): Payer: Managed Care, Other (non HMO) | Admitting: Psychiatry

## 2014-11-05 DIAGNOSIS — F4323 Adjustment disorder with mixed anxiety and depressed mood: Secondary | ICD-10-CM | POA: Diagnosis not present
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
## 2014-11-06 ENCOUNTER — Other Ambulatory Visit (HOSPITAL_COMMUNITY): Payer: Managed Care, Other (non HMO) | Admitting: Psychiatry

## 2014-11-06 DIAGNOSIS — F4323 Adjustment disorder with mixed anxiety and depressed mood: Secondary | ICD-10-CM | POA: Diagnosis not present
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
## 2014-11-09 ENCOUNTER — Other Ambulatory Visit (HOSPITAL_COMMUNITY): Payer: Managed Care, Other (non HMO) | Attending: Psychiatry | Admitting: Psychiatry

## 2014-11-09 DIAGNOSIS — F4323 Adjustment disorder with mixed anxiety and depressed mood: Secondary | ICD-10-CM | POA: Insufficient documentation
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
## 2014-11-10 ENCOUNTER — Other Ambulatory Visit (HOSPITAL_COMMUNITY): Payer: Managed Care, Other (non HMO) | Admitting: Psychiatry
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
## 2014-11-11 ENCOUNTER — Other Ambulatory Visit (HOSPITAL_COMMUNITY): Payer: Managed Care, Other (non HMO)

## 2014-11-12 ENCOUNTER — Other Ambulatory Visit (HOSPITAL_COMMUNITY): Payer: Managed Care, Other (non HMO) | Admitting: Licensed Clinical Social Worker

## 2014-11-12 DIAGNOSIS — F410 Panic disorder [episodic paroxysmal anxiety] without agoraphobia: Secondary | ICD-10-CM

## 2014-11-12 DIAGNOSIS — F4323 Adjustment disorder with mixed anxiety and depressed mood: Secondary | ICD-10-CM | POA: Diagnosis present

## 2014-11-12 DIAGNOSIS — F331 Major depressive disorder, recurrent, moderate: Secondary | ICD-10-CM

## 2014-11-12 NOTE — Progress Notes (Signed)
    Daily Group Progress Note  Program: IOP  Group Time: 11:00 AM-10:45 AM  Participation Level: Active  Behavioral Response: Appropriate and Sharing  Type of Therapy:  Group Therapy  Summary of Progress: Group topic came from group discussion and how to get unstuck from a bad day. Group started with check in. Patients discussed how self-talk, moving to action and recognizing personal responsibility as helpful strategies. The group also discussed their style of communication related to being an introvert or extrovert. They recognized that they have to understand their partner's style of communication and meet them where they are.  Direct communication was useful and patient's discussed the fair fighting rules they have learned to help them with communicating effectively. Other points were that self-talk helps to navigate the relationship, and letting go of frustrations.  Patient shared about practicing patience as helpful and some  strategies she has used to navigate stressors in her relationship. She participated actively through sharing and listening respectfully. My assessment is that the group is therapeutic as it provides a supportive environment for her to share.      Group Time: 11:00 AM-12:00 PM  Participation Level:  Active  Behavioral Response: Appropriate, Sharing and Motivated  Type of Therapy: Group Therapy  Summary of Progress: Therapist introduced a reading and explained that it helps to start the day with some type of ritual that helps you to ease into your day. The title of the reading was "Owning Our Power in Relationships". The group discussion focused on the reflection of practicing using your power to take care of yourself, no matter who you are dealing with, where you are, or what you are doing. Patients discussed that work on themselves in their recovery has helped them in noticing more where they need to take care of their needs and still need to work on taking  better care of their needs. The consensus was that it takes practice. Group discussion was also helpful for group members to talk about some of their stressors and get supportive feedback. Patient related that she has been working on codependency issues and that practice is needed in practicing your power to take care of your needs. Patient participated actively through sharing and respectfully listening.     Lorretta Kerce A

## 2014-11-13 ENCOUNTER — Other Ambulatory Visit (HOSPITAL_COMMUNITY): Payer: Managed Care, Other (non HMO) | Admitting: Psychiatry

## 2014-11-13 DIAGNOSIS — F331 Major depressive disorder, recurrent, moderate: Secondary | ICD-10-CM

## 2014-11-13 DIAGNOSIS — F4323 Adjustment disorder with mixed anxiety and depressed mood: Secondary | ICD-10-CM | POA: Diagnosis not present

## 2014-11-16 ENCOUNTER — Other Ambulatory Visit (HOSPITAL_COMMUNITY): Payer: Managed Care, Other (non HMO)

## 2014-11-17 ENCOUNTER — Other Ambulatory Visit (HOSPITAL_COMMUNITY): Payer: Managed Care, Other (non HMO) | Admitting: Psychiatry

## 2014-11-17 DIAGNOSIS — F331 Major depressive disorder, recurrent, moderate: Secondary | ICD-10-CM

## 2014-11-17 DIAGNOSIS — F4323 Adjustment disorder with mixed anxiety and depressed mood: Secondary | ICD-10-CM | POA: Diagnosis not present

## 2014-11-18 ENCOUNTER — Other Ambulatory Visit (HOSPITAL_COMMUNITY): Payer: Managed Care, Other (non HMO) | Admitting: Psychiatry

## 2014-11-18 DIAGNOSIS — F4323 Adjustment disorder with mixed anxiety and depressed mood: Secondary | ICD-10-CM | POA: Diagnosis not present

## 2014-11-18 DIAGNOSIS — F331 Major depressive disorder, recurrent, moderate: Secondary | ICD-10-CM

## 2014-11-18 NOTE — Progress Notes (Signed)
    Daily Group Progress Note  Program: IOP Daily Group Progress Note  Program: IOP  Group Time: 0900-1030  Participation Level: Active  Behavioral Response: Appropriate and Sharing  Type of Therapy:  Group Therapy  Summary of Progress: Pt arrived late, appeared to be in good spirits, alert, and ready to engage into the group conversation we were having.  Patient describes her morning as "decent". She was given instruction and asked to participate in an ice breaker. Patient was able to actively participate and provide appropriate responses. She was asked to provide this Clinical research associate with a brief summary of events leading to her admission into the MH- IOP program. Patient describes several events that have occurred in her life. She appears hopeful and determined to progress. Patient sts, "I realize this is my first step toward coping with my depression". The first topic of discussion was 1. "Challenging my depressed thinking". Patient described what things in her life get her depressed such as her daughter going to college, negative things to do when she becomes depressed such as isolate self from others,  difficulty speaking with family about her feelings, etc.  The group provided patient with positive feed- back as patient was eager to discuss her feelings about depression.     Group Time: 1100-1200  Participation Level:  Active  Behavioral Response: Appropriate  Type of Therapy: Psycho-education Group  Summary of Progress: We began by completing a stress level test.  Patient's score showed that she showed high levels of stress.  Secondly, patient was given a handout about anxiety and stress. She actively participated in activities to reduce her stress levels such as a listening to a meditation CD for 10 minutes, practicing deep breathing, and also practicing imagery.   She discussed how stress, anxiety, and anger has affected her life. She was able to provide examples as well as coping skills  for situations that she is currently working through.  She was encouraging to other group members. Last we all discussed "Learning self-compassion. She was able to provide and also discover ways to care for self, and then remind herself to do those things when life becomes difficult.

## 2014-11-18 NOTE — Progress Notes (Signed)
    Daily Group Progress Note  Program: IOP  Group Time: 9:00-10:30  Participation Level: Active  Behavioral Response: Appropriate  Type of Therapy:  Group Therapy  Summary of Progress: Pt. Presented with bright affect, talkative, enthusiastic about spending the weekend getting her daughter oriented to college. Pt. Reports that she continues pattern of self-care and turning her focus to herself, decorating and fixing up her home, and seeking balance and moderation in her caring for others.      Group Time: 10:30-12:00  Participation Level:  Active  Behavioral Response: Appropriate  Type of Therapy: Psycho-education Group  Summary of Progress: Pt. Watched and discussed Brene Brown's video about vulnerability in the healing process and patterns of numbing that stall the recovery process.   Shaune Pollack, LPC

## 2014-11-19 ENCOUNTER — Telehealth (HOSPITAL_COMMUNITY): Payer: Self-pay | Admitting: Psychiatry

## 2014-11-19 ENCOUNTER — Other Ambulatory Visit (HOSPITAL_COMMUNITY): Payer: Managed Care, Other (non HMO) | Admitting: Psychiatry

## 2014-11-19 DIAGNOSIS — F4323 Adjustment disorder with mixed anxiety and depressed mood: Secondary | ICD-10-CM | POA: Diagnosis not present

## 2014-11-19 DIAGNOSIS — F331 Major depressive disorder, recurrent, moderate: Secondary | ICD-10-CM

## 2014-11-19 NOTE — Progress Notes (Signed)
    Daily Group Progress Note  Program: IOP  Group Time: 9:00-10:30  Participation Level: Active  Behavioral Response: Appropriate  Type of Therapy:  Group Therapy  Summary of Progress: Pt. Presents as talkative, smiles appropriately. Pt. Reports that she is handling stress much better since joining the group. Pt. Reports that her relationship with her husband is improving which she attributes to setting better boundaries and committing to her self-care. 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     Group Time: 10:30-12:00  Participation Level:  Active  Behavioral Response: Appropriate  Type of Therapy: Psycho-education Group  Summary of Progress: Pt. Participated in discussion about developing daily practice of self-care. Pt was encouraged to think about her community service as a form of self-care and the boundaries that she is setting with her husband as self-care. Pt. Was encouraged to find at least one other activity from the sheet to engage in daily which might include taking a walk, taking 10-20 minutes to breathe or meditate before she goes to work in the morning, or saying "no" to something every day.  Shaune Pollack, LPC
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
## 2014-11-19 NOTE — Progress Notes (Signed)
Patient ID: Ruth Brown, female   DOB: 11/10/1955, 59 y.o.   MRN: [id number] Discharge Note  Patient:  Ruth Brown is an 59 y.o., female DOB:  01-03-1956  Date of Admission:  10/26/2014  Date of Discharge:  11/20/2014  Reason for Admission:anxiety and depression  IOP Course:  Attended and participated.  Much less depressed and anxious, back to her old self, she says.  Her daughter is doing well, her husband is doing better.  She expects to go back to work on 15 August and seems ready.  Mental Status at Discharge:no suicidal thoughts. Anxiety still present but manageable. Depression gone.  Lab Results: No results found for this or any previous visit (from the past 48 hour(s)).   Current outpatient prescriptions:  .  cyclobenzaprine (FLEXERIL) 5 MG tablet, Take 5 mg by mouth 3 (three) times daily as needed for muscle spasms., Disp: , Rfl:  .  lisinopril-hydrochlorothiazide (PRINZIDE,ZESTORETIC) 10-12.5 MG per tablet, Take 1 tablet by mouth daily., Disp: , Rfl:  .  mirtazapine (REMERON) 15 MG tablet, Take 1 tablet (15 mg total) by mouth at bedtime., Disp: 30 tablet, Rfl: 2 .  simvastatin (ZOCOR) 20 MG tablet, Take 20 mg by mouth daily., Disp: , Rfl:   Axis Diagnosis:  Adjustment disorder with mixed depression and anxiety   Level of Care:  IOP  Discharge destination:  Other:  has appointments with her outpatient provider and therapist  Is patient on multiple antipsychotic therapies at discharge:  No    Has Patient had three or more failed trials of antipsychotic monotherapy by history:  Negative  Patient phone:  [phone] (home)  Patient address:   [address] [place] [address],   Follow-up recommendations:  Activity:  continue current activity Diet:  continue current diet  Comments:  none  The patient received suicide prevention pamphlet:  Yes Belongings returned:  na  Carolanne Grumbling 11/19/2014, 10:12 AM

## 2014-11-19 NOTE — Telephone Encounter (Signed)
D:  Pt arrived distraught because her paycheck was only for 70% of her pay.  A:  Placed call to [facility] [phone] ext [phone]).  Left vm clarifying admission dates from last year and this year.  Requested that someone call writer back.  R:  Pt receptive.

## 2014-11-20 ENCOUNTER — Other Ambulatory Visit (HOSPITAL_COMMUNITY): Payer: Managed Care, Other (non HMO) | Admitting: Psychiatry

## 2014-11-20 DIAGNOSIS — F4323 Adjustment disorder with mixed anxiety and depressed mood: Secondary | ICD-10-CM | POA: Diagnosis not present

## 2014-11-20 DIAGNOSIS — F331 Major depressive disorder, recurrent, moderate: Secondary | ICD-10-CM

## 2014-11-20 NOTE — Patient Instructions (Signed)
Patient completed MH-IOP today.  Will follow up with Dr. Leonarda Salon on 11-20-14 and Dr. Lolly Mustache on 12-16-14 @ 10:30 a.m.  Encouraged support groups.  Pt will return to work on 11-23-14; without any restrictions.
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
## 2014-11-23 ENCOUNTER — Other Ambulatory Visit (HOSPITAL_COMMUNITY): Payer: Managed Care, Other (non HMO)

## 2014-11-23 NOTE — Progress Notes (Signed)
    Daily Group Progress Note  Program: IOP  Group Time: 9:00-10:30  Participation Level: Active  Behavioral Response: Appropriate  Type of Therapy:  Group Therapy  Summary of Progress: Pt. Prepared for discharge today. Pt. Initiated role play with another patient who was challenged by poor boundaries with a family member. Pt. Shared her personal story of son's incarceration and necessity of developing healthier boundaries with family members and learning to put her self-care first so that she can be emotionally well for her family.     Group Time: 10:30-12:00  Participation Level:  Active  Behavioral Response: Appropriate  Type of Therapy: Psycho-education Group  Summary of Progress: Pt. Participated in discussion about developing healthy relationship boundaries, patterns of over-performing in relationships and under-performing for self, patterns of using substances and other harmful behaviors for numbing, and developing patterns of healthy self-care.    Shaune Pollack, LPC

## 2014-11-24 ENCOUNTER — Other Ambulatory Visit (HOSPITAL_COMMUNITY): Payer: Managed Care, Other (non HMO)

## 2014-11-25 ENCOUNTER — Other Ambulatory Visit (HOSPITAL_COMMUNITY): Payer: Managed Care, Other (non HMO)

## 2014-11-26 ENCOUNTER — Other Ambulatory Visit (HOSPITAL_COMMUNITY): Payer: Managed Care, Other (non HMO)

## 2014-11-27 ENCOUNTER — Other Ambulatory Visit (HOSPITAL_COMMUNITY): Payer: Managed Care, Other (non HMO)

## 2014-11-30 ENCOUNTER — Other Ambulatory Visit (HOSPITAL_COMMUNITY): Payer: Managed Care, Other (non HMO)

## 2014-12-01 ENCOUNTER — Other Ambulatory Visit (HOSPITAL_COMMUNITY): Payer: Managed Care, Other (non HMO)

## 2014-12-02 ENCOUNTER — Other Ambulatory Visit (HOSPITAL_COMMUNITY): Payer: Managed Care, Other (non HMO)

## 2014-12-03 ENCOUNTER — Other Ambulatory Visit (HOSPITAL_COMMUNITY): Payer: Managed Care, Other (non HMO)

## 2014-12-04 ENCOUNTER — Other Ambulatory Visit (HOSPITAL_COMMUNITY): Payer: Managed Care, Other (non HMO)

## 2014-12-07 ENCOUNTER — Other Ambulatory Visit (HOSPITAL_COMMUNITY): Payer: Managed Care, Other (non HMO)

## 2014-12-08 ENCOUNTER — Other Ambulatory Visit (HOSPITAL_COMMUNITY): Payer: Managed Care, Other (non HMO)

## 2014-12-08 DIAGNOSIS — M25569 Pain in unspecified knee: Secondary | ICD-10-CM | POA: Insufficient documentation

## 2014-12-09 ENCOUNTER — Other Ambulatory Visit (HOSPITAL_COMMUNITY): Payer: Managed Care, Other (non HMO)

## 2014-12-10 ENCOUNTER — Other Ambulatory Visit (HOSPITAL_COMMUNITY): Payer: Managed Care, Other (non HMO)

## 2014-12-11 ENCOUNTER — Other Ambulatory Visit (HOSPITAL_COMMUNITY): Payer: Managed Care, Other (non HMO)

## 2014-12-16 ENCOUNTER — Encounter (HOSPITAL_COMMUNITY): Payer: Self-pay | Admitting: Psychiatry

## 2014-12-16 ENCOUNTER — Ambulatory Visit (INDEPENDENT_AMBULATORY_CARE_PROVIDER_SITE_OTHER): Payer: Managed Care, Other (non HMO) | Admitting: Psychiatry

## 2014-12-16 VITALS — BP 122/86 | HR 95 | Ht 60.0 in | Wt 173.4 lb

## 2014-12-16 DIAGNOSIS — F411 Generalized anxiety disorder: Secondary | ICD-10-CM

## 2014-12-16 DIAGNOSIS — F419 Anxiety disorder, unspecified: Secondary | ICD-10-CM | POA: Insufficient documentation

## 2014-12-16 DIAGNOSIS — F330 Major depressive disorder, recurrent, mild: Secondary | ICD-10-CM

## 2014-12-16 DIAGNOSIS — F339 Major depressive disorder, recurrent, unspecified: Secondary | ICD-10-CM

## 2014-12-16 MED ORDER — MIRTAZAPINE 30 MG PO TABS: 30.0000 mg | ORAL_TABLET | Freq: Every day | ORAL | Status: DC

## 2014-12-16 NOTE — Progress Notes (Signed)
[facility] [phone] Progress Note   Ruth Brown [id number] 59 y.o.  12/16/2014 12:07 PM  Chief Complaint:  I finish IOP program 2 weeks ago.  I still have anxiety.  I cannot sleep very well.    History of Present Illness:  Ruth Brown came for her followup appointment.  She recently finished intensive outpatient program.  She mentioned she had a lot of stress and feeling overwhelmed when she find out that her 26 year old daughter is pregnant .  She was very upset with her after she find out that she had a party with his classmates and friends and few days later she find out that she was pregnant.  Her daughter finally had a abortion but she has been very nervous anxious .  She had missed few days at work.  She is still sleeping 3-4 hours.  She admitted having crying spells but denies any feeling of hopelessness or worthlessness.  She is happy that her son who is in prison doing very well.  She is hoping in few months he may move close to [facility] so she can see him more often.  Patient denies any paranoia or any hallucination.  She is seeing Bonita Quin for counseling.  She endorse her husband is very supportive however she has not disclose him about the news of her daughter.  Patient denies drinking or using any illegal substances.  On his last visit she was prescribed 30 mg Remeron but she never took it and she continued on 15 mg.  She liked to take a higher dose of Remeron.  She has no side effects.  Her appetite is okay.  Her vitals are stable.  She is working at [employer] of [place] and she will need a new forms to be completed for her FMLA.  I review her blood work which was done at her primary care office.  On March 8 her glucose is 94, BUN 16, creatinine normal, AST and ALT is also normal.  Her cholesterol was 235 and LDL 175.  Suicidal Ideation: No Plan Formed: No Patient has means to carry out plan: No  Homicidal Ideation: No Plan Formed: No Patient has means to carry out plan:  No  Past Psychiatric History/Hospitalization(s) Patient has history of depression.  In the past she had tried Celexa and Paxil but stopped because it was not working.  Patient denies any history of mania, psychosis, hallucination, nightmares, flashback , paranoia or any aggression.  She had twice intensive outpatient program.    Anxiety: Yes Bipolar Disorder: No Depression: Yes Mania: No Psychosis: No Schizophrenia: No Personality Disorder: No Hospitalization for psychiatric illness: No History of Electroconvulsive Shock Therapy: No Prior Suicide Attempts: No  Medical History; Patient has history of hypertension, obesity and hyperlipidemia.  Her primary care physician is Dr. Alen Blew.  Her last blood work was in May 2015.  Review of Systems  Constitutional: Negative.   HENT: Negative.   Cardiovascular: Negative.   Musculoskeletal: Positive for joint pain.  Skin: Negative.   Neurological: Negative for dizziness, tingling and tremors.  Psychiatric/Behavioral: The patient is nervous/anxious and has insomnia.    Psychiatric: Agitation: No Hallucination: No Depressed Mood: No Insomnia: Yes Hypersomnia: No Altered Concentration: No Feels Worthless: No Grandiose Ideas: No Belief In Special Powers: No New/Increased Substance Abuse: No Compulsions: No  Neurologic: Headache: No Seizure: No Paresthesias: No    Outpatient Encounter Prescriptions as of 12/16/2014  Medication Sig  . cyclobenzaprine (FLEXERIL) 5 MG tablet Take 5 mg by mouth 3 (  three) times daily as needed for muscle spasms.  [name] lisinopril-hydrochlorothiazide (PRINZIDE,ZESTORETIC) 10-12.5 MG per tablet Take 1 tablet by mouth daily.  . mirtazapine (REMERON) 30 MG tablet Take 1 tablet (30 mg total) by mouth at bedtime.  . simvastatin (ZOCOR) 20 MG tablet Take 20 mg by mouth daily.  . [DISCONTINUED] mirtazapine (REMERON) 15 MG tablet Take 1 tablet (15 mg total) by mouth at bedtime.   No facility-administered encounter  medications on file as of 12/16/2014.    No results found for this or any previous visit (from the past 2160 hour(s)).    Physical Exam: Constitutional:  BP 122/86 mmHg  Pulse 95  Ht 5' (1.524 m)  Wt 173 lb 6.4 oz (78.654 kg)  BMI 33.86 kg/m2  Musculoskeletal: Strength & Muscle Tone: within normal limits Gait & Station: normal Patient leans: N/A  Mental Status Examination;  Patient is casually dressed and fairly groomed.  She described her mood anxious and nervous.  Her affect is constricted.  Her speech is fast but coherent.  Her thought processes logical and goal-directed.  She denies any auditory or visual hallucination.  There were no delusions, paranoia or any obsessive thoughts.  Her attention and concentration is fair.  Her fund of knowledge is adequate. She maintained good eye contact.  She is alert and oriented x3.  Her insight judgment and impulse control is okay.   Established Problem, Stable/Improving (1), Review of Psycho-Social Stressors (1), Review or order clinical lab tests (1), Review and summation of old records (2), Established Problem, Worsening (2), Review of Last Therapy Session (1), Review of Medication Regimen & Side Effects (2) and Review of New Medication or Change in Dosage (2)  Assessment: Axis I: Maj. depressive disorder, recurrent.  Generalized anxiety disorder  Axis II: Deferred  Axis III:  Past Medical History  Diagnosis Date  . Anxiety   . Depression   . HTN (hypertension)   . Hyperlipidemia    Plan:  I review her discharge summary from intensive outpatient program, recent blood work results, collateral information from primary care physician and her current medication.  She's been experiencing increased anxiety and insomnia.  She forgot to take Remeron 30 mg.  Encouraged to take Remeron 30 mg at bedtime.  Discussed medication side effects and benefits.  I will also complete her FMLA form so she can continue her doctor's appointment. She was  required 2 hours 2 times a week for another 12 weeks .  Recommended to call us back if she has any question or any concern.   Recommended to keep appointment with her therapist Adelene Amas for counseling. Followup in 4 weeks.   Rush Salce T., MD 12/16/2014

## 2015-07-22 ENCOUNTER — Encounter (HOSPITAL_COMMUNITY): Payer: Self-pay | Admitting: *Deleted

## 2015-07-22 ENCOUNTER — Telehealth: Payer: Self-pay

## 2015-07-22 ENCOUNTER — Emergency Department (HOSPITAL_COMMUNITY): Payer: Managed Care, Other (non HMO)

## 2015-07-22 ENCOUNTER — Emergency Department (HOSPITAL_COMMUNITY)
Admission: EM | Admit: 2015-07-22 | Discharge: 2015-07-22 | Disposition: A | Discharge status: Home / Self Care | Payer: Managed Care, Other (non HMO) | Attending: Emergency Medicine | Admitting: Emergency Medicine

## 2015-07-22 DIAGNOSIS — I10 Essential (primary) hypertension: Secondary | ICD-10-CM | POA: Diagnosis not present

## 2015-07-22 DIAGNOSIS — F419 Anxiety disorder, unspecified: Secondary | ICD-10-CM | POA: Diagnosis not present

## 2015-07-22 DIAGNOSIS — R079 Chest pain, unspecified: Secondary | ICD-10-CM | POA: Diagnosis present

## 2015-07-22 DIAGNOSIS — R200 Anesthesia of skin: Secondary | ICD-10-CM | POA: Insufficient documentation

## 2015-07-22 DIAGNOSIS — F329 Major depressive disorder, single episode, unspecified: Secondary | ICD-10-CM | POA: Insufficient documentation

## 2015-07-22 DIAGNOSIS — E785 Hyperlipidemia, unspecified: Secondary | ICD-10-CM | POA: Insufficient documentation

## 2015-07-22 DIAGNOSIS — G5601 Carpal tunnel syndrome, right upper limb: Secondary | ICD-10-CM | POA: Insufficient documentation

## 2015-07-22 LAB — BRAIN NATRIURETIC PEPTIDE: B NATRIURETIC PEPTIDE 5: 3.9 pg/mL (ref 0.0–100.0)

## 2015-07-22 LAB — CBC
HEMATOCRIT: 37.6 % (ref 36.0–46.0)
HEMOGLOBIN: 11.8 g/dL — AB (ref 12.0–15.0)
MCH: 27 pg (ref 26.0–34.0)
MCHC: 31.4 g/dL (ref 30.0–36.0)
MCV: 86 fL (ref 78.0–100.0)
Platelets: 291 10*3/uL (ref 150–400)
RBC: 4.37 MIL/uL (ref 3.87–5.11)
RDW: 13.1 % (ref 11.5–15.5)
WBC: 5.6 10*3/uL (ref 4.0–10.5)

## 2015-07-22 LAB — BASIC METABOLIC PANEL
ANION GAP: 12 (ref 5–15)
BUN: 7 mg/dL (ref 6–20)
CHLORIDE: 102 mmol/L (ref 101–111)
CO2: 25 mmol/L (ref 22–32)
Calcium: 9.4 mg/dL (ref 8.9–10.3)
Creatinine, Ser: 0.7 mg/dL (ref 0.44–1.00)
GFR calc Af Amer: >60 mL/min (ref >60)
GLUCOSE: 139 mg/dL — AB (ref 65–99)
POTASSIUM: 3.7 mmol/L (ref 3.5–5.1)
Sodium: 139 mmol/L (ref 135–145)

## 2015-07-22 LAB — I-STAT TROPONIN, ED
Troponin i, poc: 0 ng/mL (ref 0.00–0.08)
Troponin i, poc: 0 ng/mL (ref 0.00–0.08)

## 2015-07-22 LAB — D-DIMER, QUANTITATIVE (NOT AT ARMC): D DIMER QUANT: 0.29 ug{FEU}/mL (ref 0.00–0.50)

## 2015-07-22 MED ORDER — NAPROXEN 375 MG PO TABS: 375.0000 mg | ORAL_TABLET | Freq: Two times a day (BID) | ORAL | Status: DC

## 2015-07-22 NOTE — ED Provider Notes (Signed)
CSN: [id number]     Arrival date & time 07/22/15  4098 History   First MD Initiated Contact with Patient 07/22/15 0940     Chief Complaint  Patient presents with  . Chest Pain  . Numbness     (Consider location/radiation/quality/duration/timing/severity/associated sxs/prior Treatment) HPI This is a 60 year old female who presents the emergency department with chief complaint of right arm tingling and chest pain. Patient states that she has had almost 6 months of pain which she describes as sharp, shooting pain that comes up from her leg through her body up her right side of her chest and into her right arm. She states that it comes and goes normally but has been increasingly frequent. The patient states that her right hand frequently tingles and aches. It is worse at night and after she had slept on her arm. The pain is not worsened by movement. 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 The patient also states that she has had some shortness of breath, which she describes as pressure-like and exertional. She denies chest pain, nausea, diaphoresis. She states that she is feeling more winded Past Medical History  Diagnosis Date  . Anxiety   . Depression   . HTN (hypertension)   . Hyperlipidemia    Past Surgical History  Procedure Laterality Date  . Cesarean section     Family History  Problem Relation Age of Onset  . Depression Mother   . Anxiety disorder Mother   . Alcohol abuse Mother   . Depression Maternal Aunt   . Anxiety disorder Maternal Aunt    Social History  Substance Use Topics  . Smoking status: Never Smoker   . Smokeless tobacco: None  . Alcohol Use: No   OB History    No data available     Review of Systems  Ten systems reviewed and are negative for acute change, except as noted in the HPI.    Allergies  Review of patient's allergies indicates no known allergies.  Home Medications   Prior to Admission medications   Medication Sig Start  Date End Date Taking? Authorizing Provider  citalopram (CELEXA) 20 MG tablet Take 20 mg by mouth daily.   Yes Historical Provider, MD  diclofenac (VOLTAREN) 75 MG EC tablet Take 75 mg by mouth 2 (two) times daily. 07/19/15  Yes Historical Provider, MD  lisinopril-hydrochlorothiazide (PRINZIDE,ZESTORETIC) 10-12.5 MG per tablet Take 1 tablet by mouth daily.   Yes Historical Provider, MD  mirtazapine (REMERON) 30 MG tablet Take 1 tablet (30 mg total) by mouth at bedtime. 12/16/14 12/16/15 Yes Cleotis Nipper, MD  simvastatin (ZOCOR) 20 MG tablet Take 20 mg by mouth every other day.    Yes Historical Provider, MD  vitamin B-12 (CYANOCOBALAMIN) 100 MCG tablet Take 100 mcg by mouth daily.   Yes Historical Provider, MD   BP 127/69 mmHg  Temp(Src) 98.3 F (36.8 C) (Oral)  Resp 18  Ht 5' (1.524 m)  Wt 86.183 kg  BMI 37.11 kg/m2  SpO2 95%  LMP  (LMP Unknown) Physical Exam Physical Exam  Nursing note and vitals reviewed. Constitutional: She is oriented to person, place, and time. She appears well-developed and well-nourished. No distress.  HENT:  Head: Normocephalic and atraumatic.  Eyes: Conjunctivae normal and EOM are normal. Pupils are equal, round, and reactive to light. No scleral icterus.  Neck: Normal range of motion.  Cardiovascular: Normal rate, regular rhythm and normal heart sounds.  Exam reveals no gallop and  no friction rub.   No murmur heard. Pulmonary/Chest: Effort normal and breath sounds normal. No respiratory distress.  Abdominal: Soft. Bowel sounds are normal. She exhibits no distension and no mass. There is no tenderness. There is no guarding.  Neurological: She is alert and oriented to person, place, and time.  Skin: Skin is warm and dry. She is not diaphoretic.    ED Course  Procedures (including critical care time) Labs Review Labs Reviewed  BASIC METABOLIC PANEL - Abnormal; Notable for the following:    Glucose, Bld 139 (*)    All other components within normal limits   CBC - Abnormal; Notable for the following:    Hemoglobin 11.8 (*)    All other components within normal limits  I-STAT TROPOININ, ED    Imaging Review Dg Chest 2 View  07/22/2015  CLINICAL DATA:  Chest pain, right side neck pain EXAM: CHEST  2 VIEW COMPARISON:  None. FINDINGS: Cardiomediastinal silhouette is unremarkable. No acute infiltrate or pleural effusion. No pulmonary edema. Mild lower thoracic dextroscoliosis. Degenerative changes mid and lower thoracic spine. IMPRESSION: No active disease. Mild lower thoracic dextroscoliosis. Degenerative changes mid and lower thoracic spine. Electronically Signed   By: Natasha MeadLiviu  Pop M.D.   On: 07/22/2015 09:21   I have personally reviewed and evaluated these images and lab results as part of my medical decision-making.   EKG Interpretation   Date/Time:  Thursday July 22 2015 08:58:47 EDT Ventricular Rate:  100 PR Interval:  134 QRS Duration: 72 QT Interval:  360 QTC Calculation: 464 R Axis:   25 Text Interpretation:  Normal sinus rhythm Possible Left atrial enlargement  Cannot rule out Anterior infarct , age undetermined Abnormal ECG No  previous ECGs available Confirmed by RANCOUR  MD, STEPHEN [id number]) on  07/22/2015 11:02:51 AM      MDM   Final diagnoses:  Chest pain, unspecified chest pain type  Carpal tunnel syndrome of right wrist   Patient with 2 negative troponins. CP reproducible with palpation and + tinnels and phalen's. D-dimer is negative. HEart score is 3. [facility] the patient this evening for a follow up appointment with Cardiaology for strwess test. I have discussed the findings with the patient and reasons to seek immediate medical care. The patient will be discharged. Discussed return precautions.   Arthor Captainbigail Manjot Beumer, PA-C 07/25/15 1737  Glynn OctaveStephen Rancour, MD 07/26/15 1052

## 2015-07-22 NOTE — Discharge Instructions (Signed)
Nonspecific Chest Pain  Chest pain can be caused by many different conditions. There is always a chance that your pain could be related to something serious, such as a heart attack or a blood clot in your lungs. Chest pain can also be caused by conditions that are not life-threatening. If you have chest pain, it is very important to follow up with your health care provider. CAUSES  Chest pain can be caused by:  Heartburn.  Pneumonia or bronchitis.  Anxiety or stress.  Inflammation around your heart (pericarditis) or lung (pleuritis or pleurisy).  A blood clot in your lung.  A collapsed lung (pneumothorax). It can develop suddenly on its own (spontaneous pneumothorax) or from trauma to the chest.  Shingles infection (varicella-zoster virus).  Heart attack.  Damage to the bones, muscles, and cartilage that make up your chest wall. This can include:  Bruised bones due to injury.  Strained muscles or cartilage due to frequent or repeated coughing or overwork.  Fracture to one or more ribs.  Sore cartilage due to inflammation (costochondritis). RISK FACTORS  Risk factors for chest pain may include:  Activities that increase your risk for trauma or injury to your chest.  Respiratory infections or conditions that cause frequent coughing.  Medical conditions or overeating that can cause heartburn.  Heart disease or family history of heart disease.  Conditions or health behaviors that increase your risk of developing a blood clot.  Having had chicken pox (varicella zoster). SIGNS AND SYMPTOMS Chest pain can feel like:  Burning or tingling on the surface of your chest or deep in your chest.  Crushing, pressure, aching, or squeezing pain.  Dull or sharp pain that is worse when you move, cough, or take a deep breath.  Pain that is also felt in your back, neck, shoulder, or arm, or pain that spreads to any of these areas. Your chest pain may come and go, or it may stay  constant. DIAGNOSIS Lab tests or other studies may be needed to find the cause of your pain. Your health care provider may have you take a test called an ambulatory ECG (electrocardiogram). An ECG records your heartbeat patterns at the time the test is performed. You may also have other tests, such as:  Transthoracic echocardiogram (TTE). During echocardiography, sound waves are used to create a picture of all of the heart structures and to look at how blood flows through your heart.  Transesophageal echocardiogram (TEE).This is a more advanced imaging test that obtains images from inside your body. It allows your health care provider to see your heart in finer detail.  Cardiac monitoring. This allows your health care provider to monitor your heart rate and rhythm in real time.  Holter monitor. This is a portable device that records your heartbeat and can help to diagnose abnormal heartbeats. It allows your health care provider to track your heart activity for several days, if needed.  Stress tests. These can be done through exercise or by taking medicine that makes your heart beat more quickly.  Blood tests.  Imaging tests. TREATMENT  Your treatment depends on what is causing your chest pain. Treatment may include:  Medicines. These may include:  Acid blockers for heartburn.  Anti-inflammatory medicine.  Pain medicine for inflammatory conditions.  Antibiotic medicine, if an infection is present.  Medicines to dissolve blood clots.  Medicines to treat coronary artery disease.  Supportive care for conditions that do not require medicines. This may include:  Resting.  Applying heat  or cold packs to injured areas.  Limiting activities until pain decreases. HOME CARE INSTRUCTIONS  If you were prescribed an antibiotic medicine, finish it all even if you start to feel better.  Avoid any activities that bring on chest pain.  Do not use any tobacco products, including  cigarettes, chewing tobacco, or electronic cigarettes. If you need help quitting, ask your health care provider.  Do not drink alcohol.  Take medicines only as directed by your health care provider.  Keep all follow-up visits as directed by your health care provider. This is important. This includes any further testing if your chest pain does not go away.  If heartburn is the cause for your chest pain, you may be told to keep your head raised (elevated) while sleeping. This reduces the chance that acid will go from your stomach into your esophagus.  Make lifestyle changes as directed by your health care provider. These may include:  Getting regular exercise. Ask your health care provider to suggest some activities that are safe for you.  Eating a heart-healthy diet. A registered dietitian can help you to learn healthy eating options.  Maintaining a healthy weight.  Managing diabetes, if necessary.  Reducing stress. SEEK MEDICAL CARE IF:  Your chest pain does not go away after treatment.  You have a rash with blisters on your chest.  You have a fever. SEEK IMMEDIATE MEDICAL CARE IF:   Your chest pain is worse.  You have an increasing cough, or you cough up blood.  You have severe abdominal pain.  You have severe weakness.  You faint.  You have chills.  You have sudden, unexplained chest discomfort.  You have sudden, unexplained discomfort in your arms, back, neck, or jaw.  You have shortness of breath at any time.  You suddenly start to sweat, or your skin gets clammy.  You feel nauseous or you vomit.  You suddenly feel light-headed or dizzy.  Your heart begins to beat quickly, or it feels like it is skipping beats. These symptoms may represent a serious problem that is an emergency. Do not wait to see if the symptoms will go away. Get medical help right away. Call your local emergency services (911 in the [place] Do not drive yourself to the hospital.   This  information is not intended to replace advice given to you by your health care provider. Make sure you discuss any questions you have with your health care provider.   Document Released: 01/04/2005 Document Revised: 04/17/2014 Document Reviewed: 10/31/2013 Elsevier Interactive Patient Education 2016 [employer] Inc[employer]   Carpal Tunnel Syndrome Carpal tunnel syndrome is a condition that causes pain in your hand and arm. The carpal tunnel is a narrow area located on the palm side of your wrist. Repeated wrist motion or certain diseases may cause swelling within the tunnel. This swelling pinches the main nerve in the wrist (median nerve). CAUSES  This condition may be caused by:   Repeated wrist motions.  Wrist injuries.  Arthritis.  A cyst or tumor in the carpal tunnel.  Fluid buildup during pregnancy. Sometimes the cause of this condition is not known.  RISK FACTORS This condition is more likely to develop in:   People who have jobs that cause them to repeatedly move their wrists in the same motion, such as butchers and cashiers.  Women.  People with certain conditions, such as:  Diabetes.  Obesity.  An underactive thyroid (hypothyroidism).  Kidney failure. SYMPTOMS  Symptoms of this condition include:  A tingling feeling in your fingers, especially in your thumb, index, and middle fingers.  Tingling or numbness in your hand.  An aching feeling in your entire arm, especially when your wrist and elbow are bent for long periods of time.  Wrist pain that goes up your arm to your shoulder.  Pain that goes down into your palm or fingers.  A weak feeling in your hands. You may have trouble grabbing and holding items. Your symptoms may feel worse during the night.  DIAGNOSIS  This condition is diagnosed with a medical history and physical exam. You may also have tests, including:   An electromyogram (EMG). This test measures electrical signals sent by your nerves into the  muscles.  X-rays. TREATMENT  Treatment for this condition includes:  Lifestyle changes. It is important to stop doing or modify the activity that caused your condition.  Physical or occupational therapy.  Medicines for pain and inflammation. This may include medicine that is injected into your wrist.  A wrist splint.  Surgery. HOME CARE INSTRUCTIONS  If You Have a Splint:  Wear it as told by your health care provider. Remove it only as told by your health care provider.  Loosen the splint if your fingers become numb and tingle, or if they turn cold and blue.  Keep the splint clean and dry. General Instructions  Take over-the-counter and prescription medicines only as told by your health care provider.  Rest your wrist from any activity that may be causing your pain. If your condition is work related, talk to your employer about changes that can be made, such as getting a wrist pad to use while typing.  If directed, apply ice to the painful area:  Put ice in a plastic bag.  Place a towel between your skin and the bag.  Leave the ice on for 20 minutes, 2-3 times per day.  Keep all follow-up visits as told by your health care provider. This is important.  Do any exercises as told by your health care provider, physical therapist, or occupational therapist. SEEK MEDICAL CARE IF:   You have new symptoms.  Your pain is not controlled with medicines.  Your symptoms get worse.   This information is not intended to replace advice given to you by your health care provider. Make sure you discuss any questions you have with your health care provider.   Document Released: 03/24/2000 Document Revised: 12/16/2014 Document Reviewed: 08/12/2014 Elsevier Interactive Patient Education [employer]2016 Else[employer] Inc.

## 2015-07-22 NOTE — ED Notes (Signed)
Pt states chest pain accompanied by sob yesterday and last night.  Pt also c/o R arm numbness and tingling.  Pain increases with palpation.

## 2015-07-22 NOTE — Telephone Encounter (Signed)
[facility], Dr.Harris requesting patient to have a outpatient GXT.Advised our scheduler will call patient this afternoon to schedule new patient cardiology consult for chest pain.

## 2015-07-26 ENCOUNTER — Telehealth: Payer: Self-pay

## 2015-07-29 NOTE — Telephone Encounter (Signed)
Close encounter 

## 2015-09-07 ENCOUNTER — Encounter (HOSPITAL_COMMUNITY): Payer: Self-pay | Admitting: Psychiatry

## 2015-09-07 ENCOUNTER — Other Ambulatory Visit (HOSPITAL_COMMUNITY): Payer: Managed Care, Other (non HMO) | Attending: Psychiatry | Admitting: Psychiatry

## 2015-09-07 DIAGNOSIS — F331 Major depressive disorder, recurrent, moderate: Secondary | ICD-10-CM | POA: Diagnosis present

## 2015-09-07 NOTE — Progress Notes (Signed)
    Daily Group Progress Note  Program: IOP  Group Time: 10:30-12:00pm  Participation Level: Active  Behavioral Response: Appropriate  Type of Therapy:  Group Therapy  Summary of Progress: Pt began the program today. She came in late after meeting with the psychiatrist. Pt shared why she needed to come to IOP. She has major depression and also she thinks some mania. Pt was able to describe her symptoms in depth. Pt asked for suggestions from the group on dealing with her depression. Pt appeared open and honest in her sharing about the depth of her depression. She was able to identify with another pt in the group who also struggles with depression. She shared some of her coping skills with the other group member.     Chistian Kasler S, Licensed Cli

## 2015-09-07 NOTE — Progress Notes (Signed)
Psychiatric Initial Adult Assessment   Patient Identification: Ruth Brown MRN:  [id number] Date of Evaluation:  09/07/2015 Referral Source: Dr Lolly MustacheArfeen Chief Complaint:   Chief Complaint    Anxiety; Depression; Stress     Visit Diagnosis:    ICD-9-CM ICD-10-CM   1. Depression, major, recurrent, moderate (HCC) 296.32 F33.1     History of Present Illness:  Ms Ruth Brown was in IOP about a year ago and was helped she said.  Currently she is depressed again with no motivation, decreased energy and interest, poor focus, poor memory, need for excessive sleep, wish to avoid and withdraw and feeling overwhelmed by life.  Says things are actually going well compared to her stay here last year but is stressed out by the combination of things she has to do.  Her job changed once again in that more responsibilities were added but with increased pay.  That is stressful but by itself would not explain how she feels she believes.  Her husband is working and very supportive.  Her 3 college aged kids are home for the summer.  Her son is on probation for a felony ( the 3rd college aged kid).  She has taken on extra responsibility at church.  She is taking college courses for her BA she never completed and probably her Masters.  She likes all these aspects of her life but taken together she feels overwhelmed and because of the increasing depression she cannot organize, relax and enjoy the current life she has. She is regularly being asked to help people and to take on even more responsibilities and feels guilty when she has to say "no".  Associated Signs/Symptoms: Depression Symptoms:  depressed mood, hypersomnia, fatigue, difficulty concentrating, impaired memory, anxiety, weight gain, increased appetite, (Hypo) Manic Symptoms:  none Anxiety Symptoms:  Excessive Worry, Psychotic Symptoms:  none PTSD Symptoms: Negative  Past Psychiatric History: IOP, continued therapy and medication  treatment  Previous Psychotropic Medications: Yes   Substance Abuse History in the last 12 months:  No.  Consequences of Substance Abuse: Negative  Past Medical History:  Past Medical History  Diagnosis Date  . Anxiety   . Depression   . HTN (hypertension)   . Hyperlipidemia     Past Surgical History  Procedure Laterality Date  . Cesarean section      Family Psychiatric History: none reported  Family History:  Family History  Problem Relation Age of Onset  . Depression Mother   . Anxiety disorder Mother   . Alcohol abuse Mother   . Depression Maternal Aunt   . Anxiety disorder Maternal Aunt     Social History:   Social History   Social History  . Marital Status: Married    Spouse Name: N/A  . Number of Children: N/A  . Years of Education: N/A   Social History Main Topics  . Smoking status: Never Smoker   . Smokeless tobacco: None  . Alcohol Use: No  . Drug Use: No  . Sexual Activity: Yes   Other Topics Concern  . None   Social History Narrative    Additional Social History: none reported  Allergies:  No Known Allergies  Metabolic Disorder Labs: No results found for: HGBA1C, MPG No results found for: PROLACTIN No results found for: CHOL, TRIG, HDL, CHOLHDL, VLDL, LDLCALC   Current Medications: Current Outpatient Prescriptions  Medication Sig Dispense Refill  . citalopram (CELEXA) 20 MG tablet Take 20 mg by mouth daily.    . diclofenac (VOLTAREN) 75  MG EC tablet Take 75 mg by mouth 2 (two) times daily.    [name] lisinopril-hydrochlorothiazide (PRINZIDE,ZESTORETIC) 10-12.5 MG per tablet Take 1 tablet by mouth daily.    . mirtazapine (REMERON) 30 MG tablet Take 1 tablet (30 mg total) by mouth at bedtime. 30 tablet 0  . naproxen (NAPROSYN) 375 MG tablet Take 1 tablet (375 mg total) by mouth 2 (two) times daily with a meal. 20 tablet 0  . simvastatin (ZOCOR) 20 MG tablet Take 20 mg by mouth every other day.     . vitamin B-12 (CYANOCOBALAMIN) 100 MCG  tablet Take 100 mcg by mouth daily.     No current facility-administered medications for this visit.    Neurologic: Headache: Negative Seizure: Negative Paresthesias:Negative  Musculoskeletal: Strength & Muscle Tone: within normal limits Gait & Station: normal Patient leans: N/A  Psychiatric Specialty Exam: ROS  There were no vitals taken for this visit.There is no weight on file to calculate BMI.  General Appearance: Well Groomed  Eye Contact:  Good  Speech:  Clear and Coherent  Volume:  Normal  Mood:  Depressed  Affect:  Congruent  Thought Process:  Coherent  Orientation:  Full (Time, Place, and Person)  Thought Content:  Logical  Suicidal Thoughts:  No  Homicidal Thoughts:  No  Memory:  Immediate;   Good Recent;   Good Remote;   Good  Judgement:  Good  Insight:  Good  Psychomotor Activity:  Normal  Concentration:  Concentration: Good and Attention Span: Good  Recall:  Good  Fund of Knowledge:Good  Language: Good  Akathisia:  Negative  Handed:  Right  AIMS (if indicated):  0  Assets:  Communication Skills Desire for Improvement Financial Resources/Insurance Housing Intimacy Leisure Time Physical Health Resilience Social Support Talents/Skills Transportation Vocational/Educational  ADL's:  Intact  Cognition: WNL  Sleep:  Excessive wish for sleep    Treatment Plan Summary: daily group therapy   Carolanne Grumbling, MD 5/30/20171:53 PM
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
## 2015-09-08 ENCOUNTER — Other Ambulatory Visit (HOSPITAL_COMMUNITY): Payer: Managed Care, Other (non HMO) | Admitting: Psychiatry

## 2015-09-08 DIAGNOSIS — F331 Major depressive disorder, recurrent, moderate: Secondary | ICD-10-CM | POA: Diagnosis not present

## 2015-09-08 NOTE — Progress Notes (Signed)
    Daily Group Progress Note  Program: IOP  Group Time:   Participation Level: Active  Behavioral Response: Appropriate  Type of Therapy:  Group Therapy  Summary of Progress: Pt participated in a discussion on finding her "safe place." Pt struggles with her job and must have coping tools to deal with her stress at work. Pt explained her safe place to the group and shared how it affected her being. Pt also participated in a discussion on self-care. She is trying harder to take time for herself and less time taking care of others.        Ruth Brown S, Licensed Cli

## 2015-09-08 NOTE — Progress Notes (Signed)
    Daily Group Progress Note  Program: IOP  Group Time: 9:00-10:30  Participation Level: Active  Behavioral Response: Appropriate  Type of Therapy:  Group Therapy  Summary of Progress: Pt. Met with case manager and psychiatrist for intake assessment.     Nancie Neas, LPC

## 2015-09-09 ENCOUNTER — Other Ambulatory Visit (HOSPITAL_COMMUNITY): Payer: Managed Care, Other (non HMO) | Attending: Psychiatry | Admitting: Psychiatry

## 2015-09-09 DIAGNOSIS — F331 Major depressive disorder, recurrent, moderate: Secondary | ICD-10-CM | POA: Diagnosis present

## 2015-09-09 NOTE — Progress Notes (Signed)
    Daily Group Progress Note  Program: IOP  Group Time: 9:00-10:30  Participation Level: Active  Behavioral Response: Appropriate  Type of Therapy:  Group Therapy  Summary of progress: Pt. was very active in group, and was a big encouragement/related to other patients that are having difficulties and stress related to their jobs.   [name], [name], LPC

## 2015-09-10 ENCOUNTER — Other Ambulatory Visit (HOSPITAL_COMMUNITY): Payer: Managed Care, Other (non HMO) | Admitting: Psychiatry

## 2015-09-10 DIAGNOSIS — F331 Major depressive disorder, recurrent, moderate: Secondary | ICD-10-CM

## 2015-09-10 NOTE — Progress Notes (Signed)
    Daily Group Progress Note  Program: IOP  Group Time: 9:00-12:00   Participation Level:  active   Behavioral Response: engaged   Type of Therapy:   group therapy   Summary of Progress: Client discussed continuing to work on boundaries with her children.  Client was encouraged to continue to work on and maintain these boundaries for her own health.  Client stated she continues to try to find a positive for every negative thought that enters her mind.  [name], [name], LPC

## 2015-09-10 NOTE — Progress Notes (Signed)
    Daily Group Progress Note  Program: IOP  Group Time: 9:00-10:30  Participation Level: Active  Behavioral Response: Appropriate  Type of Therapy:  Group Therapy  Summary of Progress: Pt. Presented as talkative, engaged in the group process. Pt. Discussed recent interactions with her family and necessity of setting healthy boundaries with family members who are toxic. Pt. Also discussed progress that she has made in relationship with her husband who is in recovery from alcohol dependence.     Group Time: 10:30-12:00  Participation Level:  Active  Behavioral Response: Appropriate  Type of Therapy: Psycho-education Group  Summary of Progress: Pt. Participated in grief and loss group with the Chaplain.  [name], [name], LPC

## 2015-09-13 ENCOUNTER — Other Ambulatory Visit (HOSPITAL_COMMUNITY): Payer: Managed Care, Other (non HMO) | Admitting: Psychiatry

## 2015-09-13 DIAGNOSIS — F331 Major depressive disorder, recurrent, moderate: Secondary | ICD-10-CM | POA: Diagnosis not present

## 2015-09-14 ENCOUNTER — Other Ambulatory Visit (HOSPITAL_COMMUNITY): Payer: Managed Care, Other (non HMO) | Admitting: Licensed Clinical Social Worker

## 2015-09-14 DIAGNOSIS — F331 Major depressive disorder, recurrent, moderate: Secondary | ICD-10-CM | POA: Diagnosis not present

## 2015-09-14 NOTE — Progress Notes (Signed)
    Daily Group Progress Note  Program: IOP  Group Time: 9:00-12:00  Participation Level: Active  Behavioral Response: Appropriate  Type of Therapy:  Group Therapy  Summary of Progress: Group met with pharmacist. Group discussed values and completed "fallout shelter" activity; client identified family and her health as her top values.        Nancie Neas, LPC

## 2015-09-15 ENCOUNTER — Other Ambulatory Visit (HOSPITAL_COMMUNITY): Payer: Managed Care, Other (non HMO) | Admitting: Psychiatry

## 2015-09-15 DIAGNOSIS — F331 Major depressive disorder, recurrent, moderate: Secondary | ICD-10-CM | POA: Diagnosis not present

## 2015-09-15 MED ORDER — CITALOPRAM HYDROBROMIDE 20 MG PO TABS: 20.0000 mg | ORAL_TABLET | Freq: Every day | ORAL | Status: DC

## 2015-09-15 NOTE — Progress Notes (Signed)
Daily Group Progress Note  Program: IOP  Group Time: 10:30-12:00  Participation Level: Active  Behavioral Response: Appropriate  Type of Therapy:  Group Therapy  Summary of Progress: Pt presented as talkative, engaged in the group process. Pt participated in discussion about "letting go" of things she doesn't have control over. She was encouraged to use her coping skills for use with her anxiety she has with her son.

## 2015-09-15 NOTE — Progress Notes (Signed)
Daily Group Progress Note  Program: IOP  Group Time: 10:45-12:00pm  Participation Level: Active  Behavioral Response: Appropriate  Type of Therapy:  Group Therapy  Summary of Progress: Pt presented as talkative, engaged in the group process. Pt participated in a discussion about self-esteem. Pt was able to identify, through an activity, her overall subjective emotional evaluation of her own worth. Pt was encouraged to continue to work on her self esteem by using positive affirmations.  
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
## 2015-09-15 NOTE — Progress Notes (Signed)
    Daily Group Progress Note  Program: IOP  Group Time: 9:00-10:30  Participation Level: Active  Behavioral Response: Appropriate  Type of Therapy:  Group Therapy  Summary of Progress: Pt. Presented as talkative, engaged in group process. Pt. Discussed plans to return to college. Participated in discussion about the development of mindfulness and use to manage depression. Pt. Was encouraged to do daily check-in of ability and motivation and challenge self to engage in the day based on daily assessment.       [name], [name], LPC

## 2015-09-16 ENCOUNTER — Other Ambulatory Visit (HOSPITAL_COMMUNITY): Payer: Managed Care, Other (non HMO)

## 2015-09-17 ENCOUNTER — Other Ambulatory Visit (HOSPITAL_COMMUNITY): Payer: Managed Care, Other (non HMO) | Admitting: Psychiatry

## 2015-09-17 NOTE — Progress Notes (Signed)
Ruth SaugerSylvia D Brown is a 60 y.o. female patient admitted in MH-IOP due to worsening depressive and anxiety symptoms.  Due to symptoms, pt has been instructed not to work from 08-24-15 thru 10-17-15.  Once pt completes MH-IOP, she will be referred to a psychiatrist and a therapist for follow up.        Chestine SporeLARK, [name], M.Ed, CNA

## 2015-09-20 ENCOUNTER — Other Ambulatory Visit (HOSPITAL_COMMUNITY): Payer: Managed Care, Other (non HMO) | Admitting: Psychiatry

## 2015-09-20 DIAGNOSIS — F331 Major depressive disorder, recurrent, moderate: Secondary | ICD-10-CM | POA: Diagnosis not present

## 2015-09-20 NOTE — Progress Notes (Signed)
    Daily Group Progress Note  Program: IOP  Group Time: 11:00-12:00  Participation Level: Active  Behavioral Response: Appropriate  Type of Therapy:  Group Therapy  Summary of Progress: Pt. Did not attend the first two hours of group. Pt. Discussed that she has been able to tell her daughter "no" and has done better setting healthy boundaries with her family.  Group discussed understanding diagnoses, stigmas that accompany specific diagnoses, and becoming comfortable with diagnoses.  Therapist encouraged client to practice self-care techniques, and provided group with a list of possible activities.     [name], Ruth Brown, LPC

## 2015-09-21 ENCOUNTER — Other Ambulatory Visit (HOSPITAL_COMMUNITY): Payer: Managed Care, Other (non HMO) | Admitting: Licensed Clinical Social Worker

## 2015-09-21 DIAGNOSIS — F331 Major depressive disorder, recurrent, moderate: Secondary | ICD-10-CM

## 2015-09-21 NOTE — Progress Notes (Signed)
Daily Group Progress Note  Program: IOP  Group Time: 10:45-12:00pm  Participation Level: Active  Behavioral Response: Appropriate  Type of Therapy:  Psychotherapy/ Group Therapy  Summary of Progress: Pt presented as talkative, engaged in the group process. Pt participated in a discussion about "The power of No." Pt was able to identify all daily activities required and how exhausting that can be. It was suggested to pt that feeling overwhelmed continuously can lead to burn out. The opportunity to replenish mind, body and spirit can lead to a less stressful life. 

## 2015-09-21 NOTE — Progress Notes (Signed)
    Daily Group Progress Note  Program: IOP  Group Time: 9:00-10:45  Participation Level: Active  Behavioral Response: Appropriate  Type of Therapy:  Group Therapy  Summary of Progress: Patent mentioned the theme of "letting go" in her life. She mentioned that her son never came home last night, and that she was concerned about his parole. However, she stated that it was on her son, and that she could not obsess about it. Therapist mentioned how to love children differently with the group, and the importance of letting go.     [name], Lamberto Dinapoli B, LPC

## 2015-09-22 ENCOUNTER — Other Ambulatory Visit (HOSPITAL_COMMUNITY): Payer: Managed Care, Other (non HMO)

## 2015-09-23 ENCOUNTER — Other Ambulatory Visit (HOSPITAL_COMMUNITY): Payer: Managed Care, Other (non HMO) | Admitting: Psychiatry

## 2015-09-23 DIAGNOSIS — F331 Major depressive disorder, recurrent, moderate: Secondary | ICD-10-CM

## 2015-09-23 NOTE — Progress Notes (Signed)
    Daily Group Progress Note  Program: IOP  Group Time: 9:00-12:00   Participation Level:  active   Behavioral Response:  engaged   Type of Therapy:   group therapy   Summary of Progress: Client was late to group, but was active and engaged once in attendance.  Client states she feels good about the progress she is making pertaining to setting boundaries with loved ones and breaking tasks into smaller, more manageable pieces.  Client shows good insight by describing how these things have help her to other group members in need of taking the same steps.  [name], [name], LPC

## 2015-09-24 ENCOUNTER — Other Ambulatory Visit (HOSPITAL_COMMUNITY): Payer: Managed Care, Other (non HMO)

## 2015-09-27 ENCOUNTER — Other Ambulatory Visit (HOSPITAL_COMMUNITY): Payer: Managed Care, Other (non HMO) | Admitting: Licensed Clinical Social Worker

## 2015-09-27 DIAGNOSIS — F331 Major depressive disorder, recurrent, moderate: Secondary | ICD-10-CM

## 2015-09-27 DIAGNOSIS — F4323 Adjustment disorder with mixed anxiety and depressed mood: Secondary | ICD-10-CM

## 2015-09-27 NOTE — Progress Notes (Signed)
Daily Group Progress Note  Program: IOP    Group Time: 10:45-12:00pm  Participation Level: Active  Behavioral Response: Appropriate  Type of Therapy:  Group Therapy  Summary of Progress: Pt came in late to the program today. She arrived during the second half of group. Pt was tearful and shared she was overwhelmed. She was able to talk through her racing thoughts and inability to compartmentalize all the "moving parts" of her life. Pt participated in a discussion on self-care and boundaries. She was encouraged to use both self-care and her boundaries during the times she feels overwhelmed and unable to manage her life.

## 2015-09-28 ENCOUNTER — Other Ambulatory Visit (HOSPITAL_COMMUNITY): Payer: Managed Care, Other (non HMO) | Admitting: Licensed Clinical Social Worker

## 2015-09-28 DIAGNOSIS — F331 Major depressive disorder, recurrent, moderate: Secondary | ICD-10-CM

## 2015-09-28 MED ORDER — MIRTAZAPINE 15 MG PO TABS: 15.0000 mg | ORAL_TABLET | Freq: Every day | ORAL | Status: DC

## 2015-09-28 NOTE — Progress Notes (Signed)
Ruth SaugerSylvia D Brown is a 60 y.o. , married, employed, [place] female, who was referred per her psychologist (Dr. Adelene AmasLinda Makinson); treatment for worsening depressive and anxiety symptoms. According to pt, the anxiety was startiing to affect her physcially (ie. heart palpatations, pain in wrist/arm and knees). Pt is well known to this writer due to past admits in MH-IOP (June 2015 and July 2016). CC: previous charts for history. Triggers/Stressors: 1) Job ([employer]) of six yrs. Patient states she has been moved to another area; which has more duties. Pt hrs are 12n-9 pm. Pt states husband is now working two jobs, so finances aren't a strain any longer. "We are doing better. We are working on the marriage, since he doesn't drink (ETOH) as often." 2) Kids: Son is out of prison now and residing with pt. He is currently applying for jobs. Has to check in with the parole officer often. Daughter is home from college for the summer. 3) Unresolved grief/loss issues: Oldest daughter had a miscarriage in April 2017. 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  Patients Continued Reported Symptoms/Problems: Increased anxiety, sadness, tearfulness, increased appetite, poor sleep, irritability, anhedonia, isolative, no energy or motivation, indecisiveness, poor concentration, racing thoughts Pt remains in MH-IOP on a daily basis.  Is very active in all the groups.  A:  Pt is scheduled for discharge on 10-06-15.  Pt will f/u with Dr. Lolly MustacheArfeen and Dr. Leonarda SalonMakinson before returning to work on 10-20-15; without any restrictions.  Pt also plans to attend support groups.  R:  Pt receptive.    Chestine SporeLARK, [name], M.Ed, CNA

## 2015-09-28 NOTE — Progress Notes (Signed)
Patient ID: Ruth SaugerSylvia D Brown, female   DOB: 28-Dec-1955, 60 y.o.   MRN: [id number] Complains of troble getting to sleep and a feeling of hyperness.  Will try a decrease in the mirtazepine to 15 mg to see if it helps both symptoms.  [phone] mirtazepine 15 mg hs # 30 2 refills.

## 2015-09-28 NOTE — Progress Notes (Signed)
  Daily Group Progress Note  Program: IOP  Group Time: 10:45-12:00pm  Participation Level: Active  Behavioral Response: Appropriate  Type of Therapy:  Psychotherapy  Summary of Progress:  Pt participated in a discussion on healthy boundaries. As part of self-care pt was encouraged to set personal boundaries with limits and rules, which encourages mental wellness. Self-care is a common theme on achieving mental wellness.   Ottis StainLisbeth Mackenzie, LCASA

## 2015-09-29 ENCOUNTER — Other Ambulatory Visit (HOSPITAL_COMMUNITY): Payer: Managed Care, Other (non HMO) | Admitting: Psychiatry

## 2015-09-29 DIAGNOSIS — F331 Major depressive disorder, recurrent, moderate: Secondary | ICD-10-CM | POA: Diagnosis not present
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
## 2015-09-29 NOTE — Progress Notes (Signed)
Daily Group Progress Note Program: IOP Group Time: 10:45-12:00pm  Participation Level: Active  Behavioral Response: Appropriate  Type of Therapy:  Psychoeducation  Summary of Progress: Pt participated in chair yoga, facilitated by certified yoga instructor Leanne Yates, which focused on breath with movement. As a part of self-care and mindfulness for patients with anxiety and depression, yoga helps train the relaxation response.   Lisbeth Mackenzie, LCASA  

## 2015-09-30 ENCOUNTER — Other Ambulatory Visit (HOSPITAL_COMMUNITY): Payer: Managed Care, Other (non HMO) | Admitting: Psychiatry

## 2015-09-30 DIAGNOSIS — F331 Major depressive disorder, recurrent, moderate: Secondary | ICD-10-CM | POA: Diagnosis not present

## 2015-10-01 ENCOUNTER — Other Ambulatory Visit (HOSPITAL_COMMUNITY): Payer: Managed Care, Other (non HMO) | Admitting: Psychiatry

## 2015-10-01 ENCOUNTER — Telehealth (HOSPITAL_COMMUNITY): Payer: Self-pay | Admitting: Psychiatry

## 2015-10-01 NOTE — Progress Notes (Signed)
    Daily Group Progress Note  Program: IOP  Group Time: 9:00-12:00   Participation Level: active   Behavioral Response:  engaged   Type of Therapy:   group therapy   Summary of Progress: The client was late to group.  Once client arrived, she was active and engaged.  Client stated she continues to work on her own needs and creating boundaries with other people in her life.  Client stated she feels her self-esteem is improving, but not at the level she would like.  Client was challenged to create a list of 30 affirmations to read to herself twice daily.  [name], Nisaiah Bechtol B, LPC

## 2015-10-04 ENCOUNTER — Other Ambulatory Visit (HOSPITAL_COMMUNITY): Payer: Managed Care, Other (non HMO) | Admitting: Licensed Clinical Social Worker

## 2015-10-04 DIAGNOSIS — F331 Major depressive disorder, recurrent, moderate: Secondary | ICD-10-CM | POA: Diagnosis not present

## 2015-10-04 NOTE — Progress Notes (Signed)
Daily Group Progress Note  Program: IOP  Group Time: 9:00-10:45am  Participation Level: Active  Behavioral Response: Appropriate  Type of Therapy:  Psychoeducation  Summary of Progress: Pt participated in a presentation by [facility] pharmacist on medication management.     Daily Group Progress Note  Program: IOP   Group Time: 10:45-12:00pm  Participation Level: Active  Behavioral Response: Appropriate  Type of Therapy:  Psychoeducation  Summary of Progress: Pt participated in a discussion on the Recovery Pie" where sections of the pie are essential for success in mental wellness. Recovery Pie sections: Purpose in life, Fun/recreation, Positive Relationships, Spirituality, Support groups, Self-care, Exercise, Medication adherence. Pt was able to identify where growth is needed in her recovery pie and what she is doing well.     Mannat Benedetti, LCASA  

## 2015-10-05 ENCOUNTER — Other Ambulatory Visit (HOSPITAL_COMMUNITY): Payer: Managed Care, Other (non HMO) | Admitting: Psychiatry

## 2015-10-06 ENCOUNTER — Other Ambulatory Visit (HOSPITAL_COMMUNITY): Payer: Managed Care, Other (non HMO) | Admitting: Licensed Clinical Social Worker

## 2015-10-06 DIAGNOSIS — F331 Major depressive disorder, recurrent, moderate: Secondary | ICD-10-CM | POA: Diagnosis not present

## 2015-10-06 NOTE — Patient Instructions (Signed)
Pt will completed MH-IOP on Friday 10-08-15; case manager will be off.  F/U with Dr. Lolly MustacheArfeen on 10-11-15 @ 2 pm and Dr. Leonarda SalonMakinson (pt will call for appointment before returning to work).  Encourage support groups.  Informed pt of MH-IOP aftercare group every Tuesday at  5 pm.  [facility] on October 20, 2015.

## 2015-10-06 NOTE — Progress Notes (Signed)
Daily Group Progress Note  Program: IOP  Group Time: 10:45-12:00pm  Participation Level: Active  Behavioral Response: Appropriate  Type of Therapy:  Psychoeducation  Summary of Progress: Pt participated in a discussion on "I messages" where pt practiced stating her concerns, feelings, and needs in a manner that is easier for the listener to hear and understand.   Deazia Lampi S Francisco Ostrovsky, LCASA  

## 2015-10-06 NOTE — Progress Notes (Signed)
    Daily Group Progress Note  Program: IOP  Group Time: 9:00-10:45  Participation Level: Active  Behavioral Response: Appropriate  Type of Therapy:  Group Therapy  Summary of Progress: Pt. Did not attend first half of group.   Boneta LucksJennifer Laticha Ferrucci, Ph.D., [facility]

## 2015-10-07 ENCOUNTER — Other Ambulatory Visit (HOSPITAL_COMMUNITY): Payer: Managed Care, Other (non HMO) | Admitting: Psychiatry

## 2015-10-07 DIAGNOSIS — F331 Major depressive disorder, recurrent, moderate: Secondary | ICD-10-CM | POA: Diagnosis not present

## 2015-10-07 NOTE — Progress Notes (Signed)
Ruth SaugerSylvia D Brown is a 60 y.o. , married, employed, [place] female, who was referred per her psychologist (Dr. Adelene AmasLinda Brown); treatment for worsening depressive and anxiety symptoms. According to pt, the anxiety was startiing to affect her physcially (ie. heart palpatations, pain in wrist/arm and knees). Pt is well known to this writer due to past admits in MH-IOP (June 2015 and July 2016). CC: previous charts for history. Triggers/Stressors: 1) Job ([employer]) of six yrs. Patient states she has been moved to another area; which has more duties. Pt hrs are 12n-9 pm. Pt stated husband is now working two jobs, so finances aren't a strain any longer. "We are doing better. We are working on the marriage, since he doesn't drink (ETOH) as often." 2) Kids: Son is out of prison now and residing with pt. He is currently applying for jobs. Has to check in with the parole officer often. Daughter is home from college for the summer. 3) Unresolved grief/loss issues: Oldest daughter had a miscarriage in April 2017. 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  Reports overall symptoms improved.  Applying coping skills learned.  Plans to RTW on 10-20-15; without any restrictions.  A:  D/C tomorrow.  F/U with Dr. Lolly Brown and Dr. Leonarda Brown as scheduled.  Encouraged support groups.  Informed pt about the MH-IOP Aftercare Group, which meets every Tuesday from 5 pm to 6 pm.  RTW on 10-20-15.  R:  Pt receptive.    Chestine SporeLARK, [name], M.Ed, CNA

## 2015-10-08 ENCOUNTER — Other Ambulatory Visit (HOSPITAL_COMMUNITY): Payer: Managed Care, Other (non HMO) | Admitting: Psychiatry

## 2015-10-08 DIAGNOSIS — F331 Major depressive disorder, recurrent, moderate: Secondary | ICD-10-CM

## 2015-10-08 NOTE — Progress Notes (Signed)
    Daily Group Progress Note  Program: IOP   Time: 9:15-12:00 Participation Level: active Behavioral Response: engaged, responsive Type: Group Therapy Summary: Patient is preparing to discharge, and she discussed continuing to maintain healthy boundaries with her family. She stated that her job is really stressful, and that she has an exit plan in place. Therapist encouraged patient to keep utilizing those skills as she approaches discharge. Therapist also stated that it is important to be able to use coping skills on your own, and to not be dependant on group.  [name], Dorrien Grunder B, LPC

## 2015-10-11 ENCOUNTER — Other Ambulatory Visit (HOSPITAL_COMMUNITY): Payer: Managed Care, Other (non HMO)

## 2015-10-11 ENCOUNTER — Ambulatory Visit (HOSPITAL_COMMUNITY): Payer: Self-pay | Admitting: Psychiatry
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
## 2015-10-13 ENCOUNTER — Other Ambulatory Visit (HOSPITAL_COMMUNITY): Payer: Managed Care, Other (non HMO)

## 2015-10-13 NOTE — Progress Notes (Signed)
    Daily Group Progress Note  Program: IOP  Group Time: 9:00-10:30  Participation Level: Active  Behavioral Response: Appropriate  Type of Therapy:  Group Therapy  Summary of Progress: Pt. Did not attend the first half of group.     Group Time: 10:30-12:00  Participation Level:  Active  Behavioral Response: Appropriate  Type of Therapy: Psycho-education Group  Summary of Progress: Pt. Presented with significantly brightened affect, talkative, engaged in the group process. Pt. Prepared for discharge from group. Pt. Participated in discussion about the role of grief and loss in the recovery from depression and anxiety.  [name], Lucita Montoya B, LPC

## 2015-10-14 ENCOUNTER — Other Ambulatory Visit (HOSPITAL_COMMUNITY): Payer: Managed Care, Other (non HMO) | Admitting: Psychiatry

## 2015-10-15 ENCOUNTER — Other Ambulatory Visit (HOSPITAL_COMMUNITY): Payer: Managed Care, Other (non HMO)

## 2015-10-18 ENCOUNTER — Other Ambulatory Visit (HOSPITAL_COMMUNITY): Payer: Managed Care, Other (non HMO)

## 2015-10-19 ENCOUNTER — Other Ambulatory Visit (HOSPITAL_COMMUNITY): Payer: Managed Care, Other (non HMO)

## 2015-10-20 ENCOUNTER — Other Ambulatory Visit (HOSPITAL_COMMUNITY): Payer: Managed Care, Other (non HMO)

## 2015-10-21 ENCOUNTER — Other Ambulatory Visit (HOSPITAL_COMMUNITY): Payer: Managed Care, Other (non HMO)

## 2015-10-22 ENCOUNTER — Other Ambulatory Visit (HOSPITAL_COMMUNITY): Payer: Managed Care, Other (non HMO)

## 2015-10-25 ENCOUNTER — Other Ambulatory Visit (HOSPITAL_COMMUNITY): Payer: Managed Care, Other (non HMO)

## 2015-10-26 ENCOUNTER — Other Ambulatory Visit (HOSPITAL_COMMUNITY): Payer: Managed Care, Other (non HMO)

## 2015-10-27 ENCOUNTER — Other Ambulatory Visit (HOSPITAL_COMMUNITY): Payer: Managed Care, Other (non HMO)

## 2015-10-28 ENCOUNTER — Other Ambulatory Visit (HOSPITAL_COMMUNITY): Payer: Managed Care, Other (non HMO)

## 2015-10-29 ENCOUNTER — Other Ambulatory Visit (HOSPITAL_COMMUNITY): Payer: Managed Care, Other (non HMO)

## 2015-11-01 ENCOUNTER — Other Ambulatory Visit (HOSPITAL_COMMUNITY): Payer: Managed Care, Other (non HMO)

## 2015-11-12 ENCOUNTER — Ambulatory Visit (HOSPITAL_COMMUNITY): Payer: Self-pay | Admitting: Psychiatry
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
## 2016-01-26 ENCOUNTER — Ambulatory Visit (INDEPENDENT_AMBULATORY_CARE_PROVIDER_SITE_OTHER): Payer: Managed Care, Other (non HMO) | Admitting: Psychiatry

## 2016-01-26 DIAGNOSIS — F419 Anxiety disorder, unspecified: Secondary | ICD-10-CM | POA: Diagnosis not present

## 2016-01-26 DIAGNOSIS — Z811 Family history of alcohol abuse and dependence: Secondary | ICD-10-CM

## 2016-01-26 DIAGNOSIS — Z818 Family history of other mental and behavioral disorders: Secondary | ICD-10-CM | POA: Diagnosis not present

## 2016-01-26 DIAGNOSIS — F331 Major depressive disorder, recurrent, moderate: Secondary | ICD-10-CM | POA: Diagnosis not present

## 2016-01-26 MED ORDER — MIRTAZAPINE 15 MG PO TABS: 15.0000 mg | ORAL_TABLET | Freq: Every day | ORAL | 0 refills | Status: DC

## 2016-01-26 MED ORDER — CITALOPRAM HYDROBROMIDE 20 MG PO TABS: 20.0000 mg | ORAL_TABLET | Freq: Every day | ORAL | 0 refills | Status: DC

## 2016-01-26 NOTE — Patient Instructions (Signed)
1. Continue citalopram 20 mg daily 2. Continue Mirtazapine 15 mg at night 3. Return to clinic in 3 months

## 2016-02-01 ENCOUNTER — Other Ambulatory Visit (HOSPITAL_COMMUNITY): Payer: Managed Care, Other (non HMO) | Attending: Psychiatry | Admitting: Psychiatry

## 2016-02-01 ENCOUNTER — Encounter (HOSPITAL_COMMUNITY): Payer: Self-pay | Admitting: Psychiatry

## 2016-02-01 DIAGNOSIS — Z818 Family history of other mental and behavioral disorders: Secondary | ICD-10-CM | POA: Insufficient documentation

## 2016-02-01 DIAGNOSIS — F419 Anxiety disorder, unspecified: Secondary | ICD-10-CM | POA: Diagnosis not present

## 2016-02-01 DIAGNOSIS — Z79899 Other long term (current) drug therapy: Secondary | ICD-10-CM | POA: Insufficient documentation

## 2016-02-01 DIAGNOSIS — I10 Essential (primary) hypertension: Secondary | ICD-10-CM | POA: Diagnosis not present

## 2016-02-01 DIAGNOSIS — E785 Hyperlipidemia, unspecified: Secondary | ICD-10-CM | POA: Diagnosis not present

## 2016-02-01 DIAGNOSIS — F331 Major depressive disorder, recurrent, moderate: Secondary | ICD-10-CM | POA: Diagnosis present

## 2016-02-01 DIAGNOSIS — Z811 Family history of alcohol abuse and dependence: Secondary | ICD-10-CM | POA: Insufficient documentation
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
## 2016-02-01 NOTE — Progress Notes (Signed)
Psychiatric Initial Adult Assessment   Patient Identification: Ruth Brown MRN:  [id number] Date of Evaluation:  02/01/2016 Referral Source: self Chief Complaint:depression   Visit Diagnosis: major depression, recurrent moderate  History of Present Illness:  Ms Ruth Brown has been in this program a couple of times most recently earlier this year.  She said she was doing well but had some setbacks.  The major one was her daughter in college was admitted for suicidal thoughts and did not tell her until about  midway in the stay so as not to upset her more.  Her daughter was injured in a student protest and got depressed.  Her son is struggling with his post release from prison and that worries her.she still hates her job and is looking for another one.  She also had trouble getting her medications filled and fell behind on taking them.  .it all got to her and she got depressed again hoping not to have to come back but said she needed to do something before it gets worse.  Not as depressed as in the past but still sadness, no energy, interest or motivation, little pleasure in friends or activities and wishing to avoid and isolate herself.  No suicidal thoughts.  Associated Signs/Symptoms: Depression Symptoms:  depressed mood, anhedonia, hypersomnia, fatigue, difficulty concentrating, impaired memory, loss of energy/fatigue, (Hypo) Manic Symptoms:  Irritable Mood, Anxiety Symptoms:  Excessive Worry, Psychotic Symptoms:  none PTSD Symptoms: Negative  Past Psychiatric History: in ongoing outpatient therapy and medication management  Previous Psychotropic Medications: Yes   Substance Abuse History in the last 12 months:  No.  Consequences of Substance Abuse: Negative  Past Medical History:  Past Medical History:  Diagnosis Date  . Anxiety   . Depression   . HTN (hypertension)   . Hyperlipidemia     Past Surgical History:  Procedure Laterality Date  . CESAREAN SECTION       Family Psychiatric History: no changes  Family History:  Family History  Problem Relation Age of Onset  . Depression Mother   . Anxiety disorder Mother   . Alcohol abuse Mother   . Depression Maternal Aunt   . Anxiety disorder Maternal Aunt     Social History:   Social History   Social History  . Marital status: Married    Spouse name: N/A  . Number of children: N/A  . Years of education: N/A   Social History Main Topics  . Smoking status: Never Smoker  . Smokeless tobacco: None  . Alcohol use No  . Drug use: No  . Sexual activity: Yes   Other Topics Concern  . None   Social History Narrative  . None    Additional Social History: none  Allergies:  No Known Allergies  Metabolic Disorder Labs: No results found for: HGBA1C, MPG No results found for: PROLACTIN No results found for: CHOL, TRIG, HDL, CHOLHDL, VLDL, LDLCALC   Current Medications: Current Outpatient Prescriptions  Medication Sig Dispense Refill  . citalopram (CELEXA) 20 MG tablet Take 1 tablet (20 mg total) by mouth daily. 90 tablet 0  . diclofenac (VOLTAREN) 75 MG EC tablet Take 75 mg by mouth 2 (two) times daily.    [name] lisinopril-hydrochlorothiazide (PRINZIDE,ZESTORETIC) 10-12.5 MG per tablet Take 1 tablet by mouth daily.    . mirtazapine (REMERON) 15 MG tablet Take 1 tablet (15 mg total) by mouth at bedtime. 90 tablet 0  . simvastatin (ZOCOR) 20 MG tablet Take 20 mg by mouth every other day.     [name]  VIMOVO 500-20 MG TBEC Take 500 mg by mouth daily.    . vitamin B-12 (CYANOCOBALAMIN) 100 MCG tablet Take 100 mcg by mouth daily.     No current facility-administered medications for this visit.     Neurologic: Headache: Negative Seizure: Negative Paresthesias:Negative  Musculoskeletal: Strength & Muscle Tone: within normal limits Gait & Station: normal Patient leans: N/A  Psychiatric Specialty Exam: ROS  There were no vitals taken for this visit.There is no height or weight on file to  calculate BMI.  General Appearance: Well Groomed  Eye Contact:  Good  Speech:  Clear and Coherent  Volume:  Normal  Mood:  Anxious and Depressed  Affect:  Appropriate  Thought Process:  Coherent and Goal Directed  Orientation:  Full (Time, Place, and Person)  Thought Content:  Logical  Suicidal Thoughts:  No  Homicidal Thoughts:  No  Memory:  Immediate;   Fair Recent;   Good Remote;   Good  Judgement:  Good  Insight:  Good  Psychomotor Activity:  Normal  Concentration:  Concentration: Good and Attention Span: Good  Recall:  Good  Fund of Knowledge:Good  Language: Good  Akathisia:  Negative  Handed:  Right  AIMS (if indicated):  0  Assets:  Communication Skills Desire for Improvement Financial Resources/Insurance Housing Intimacy Leisure Time Physical Health Resilience Social Support Talents/Skills Transportation Vocational/Educational  ADL's:  Intact  Cognition: WNL  Sleep:  erratic    Treatment Plan Summary: Admit to IOP and continue current medications.   Carolanne GrumblingGerald Skyylar Kopf, MD 10/24/201712:50 PM

## 2016-02-02 ENCOUNTER — Other Ambulatory Visit (HOSPITAL_COMMUNITY): Payer: Managed Care, Other (non HMO) | Admitting: Psychiatry

## 2016-02-02 DIAGNOSIS — F331 Major depressive disorder, recurrent, moderate: Secondary | ICD-10-CM | POA: Diagnosis not present

## 2016-02-03 ENCOUNTER — Other Ambulatory Visit (HOSPITAL_COMMUNITY): Payer: Managed Care, Other (non HMO) | Admitting: Psychiatry

## 2016-02-03 DIAGNOSIS — F331 Major depressive disorder, recurrent, moderate: Secondary | ICD-10-CM | POA: Diagnosis not present

## 2016-02-03 NOTE — Progress Notes (Signed)
    Daily Group Progress Note  Program: IOP  Group Time: 9:00-12:00   Participation Level: active   Behavioral Response:  engaged   Type of Therapy:   group therapy   Summary of Progress: Patient shared her experience of accepting her anxiety, and how this has lessened her anxiety in the past. She discussed needing to change her job environment, and how in the past when work has become toxic, this has been helpful to her. Currently she is seeking a new job. She also related strongly to another group members experience of social anxiety and fearing the judgment of others. Patient responded positively to a CBT exercise where we wrote and illustrated our negative thoughts then wrote and illustrated a challenge to that thought. Patient saw the psychiatrist.   [name], [name], [facility]

## 2016-02-04 ENCOUNTER — Other Ambulatory Visit (HOSPITAL_COMMUNITY): Payer: Managed Care, Other (non HMO) | Admitting: Psychiatry

## 2016-02-04 DIAGNOSIS — F331 Major depressive disorder, recurrent, moderate: Secondary | ICD-10-CM

## 2016-02-07 ENCOUNTER — Telehealth (HOSPITAL_COMMUNITY): Payer: Self-pay | Admitting: Psychiatry

## 2016-02-07 ENCOUNTER — Other Ambulatory Visit (HOSPITAL_COMMUNITY): Payer: Managed Care, Other (non HMO) | Admitting: Psychiatry

## 2016-02-07 NOTE — Progress Notes (Signed)
    Daily Group Progress Note  Program: IOP  Group Time: 9:00-12:00   Participation Level:  active   Behavioral Response:  engaged   Type of Therapy:   group therapy   Summary of Progress:  Patient feels positive because she is looking to make a job shift. 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 Patient was supportive of other group members, suggesting one group member who is out of work volunteer temporarily to help find meaning. She also encouraged another group member who has stopped singing to sing again.   [name], [name], LPC

## 2016-02-07 NOTE — Progress Notes (Signed)
    Daily Group Progress Note  Program: IOP   Group Time: 9:00-12:00   Participation Level:  minimal   Behavioral Response: responsive   Type of Therapy:   group therapy   Summary of Progress:  Patient has been struggling with feelings of guilt and upset caused by her daughters recent suicidal ideation. Later in session client fell asleep, and upon waking attribute this to her medication. She requested to see the psychiatrist.   [name], [name], [facility]

## 2016-02-08 ENCOUNTER — Other Ambulatory Visit (HOSPITAL_COMMUNITY): Payer: Managed Care, Other (non HMO) | Admitting: Psychiatry

## 2016-02-08 DIAGNOSIS — F331 Major depressive disorder, recurrent, moderate: Secondary | ICD-10-CM

## 2016-02-08 NOTE — Progress Notes (Signed)
    Daily Group Progress Note  Program: IOP  Group Time: 9:00-12:00  Participation Level: Active  Behavioral Response: Appropriate  Type of Therapy:  Group Therapy  Summary of Progress: Pt. Presents as talkative, and sharing her progress from previous IOP experiences. Pt. Participated in discussion of the grounding series for the management of anxiety and depression.       [name], [name], LPC

## 2016-02-09 ENCOUNTER — Other Ambulatory Visit (HOSPITAL_COMMUNITY): Payer: Managed Care, Other (non HMO) | Attending: Psychiatry | Admitting: Psychiatry

## 2016-02-09 DIAGNOSIS — F331 Major depressive disorder, recurrent, moderate: Secondary | ICD-10-CM | POA: Insufficient documentation

## 2016-02-10 ENCOUNTER — Other Ambulatory Visit (HOSPITAL_COMMUNITY): Payer: Managed Care, Other (non HMO) | Admitting: Psychiatry

## 2016-02-10 DIAGNOSIS — F331 Major depressive disorder, recurrent, moderate: Secondary | ICD-10-CM

## 2016-02-11 ENCOUNTER — Other Ambulatory Visit (HOSPITAL_COMMUNITY): Payer: Managed Care, Other (non HMO) | Admitting: Psychiatry

## 2016-02-11 DIAGNOSIS — F331 Major depressive disorder, recurrent, moderate: Secondary | ICD-10-CM | POA: Diagnosis not present

## 2016-02-14 ENCOUNTER — Other Ambulatory Visit (HOSPITAL_COMMUNITY): Payer: Managed Care, Other (non HMO) | Admitting: Psychiatry

## 2016-02-14 DIAGNOSIS — F331 Major depressive disorder, recurrent, moderate: Secondary | ICD-10-CM

## 2016-02-15 ENCOUNTER — Other Ambulatory Visit (HOSPITAL_COMMUNITY): Payer: Managed Care, Other (non HMO) | Admitting: Psychiatry

## 2016-02-15 DIAGNOSIS — F331 Major depressive disorder, recurrent, moderate: Secondary | ICD-10-CM

## 2016-02-15 NOTE — Progress Notes (Signed)
    Daily Group Progress Note  Program: IOP Group Time: 9:00-12:00   Participation Level:  active   Behavioral Response:  engaged   Type of Therapy:  group therapy   Summary of Progress: Patient was alert and active during group.  Counselor and group members discussed positive psychology after watching a "The Positive Advantage" TedTalk.  Patient stated she would try some techniques provided in the video, specifically journaling and writing three gratitudes.  Patient shared she uses grounding and meditation to help her deal with anxiety.  [name], [name], LPC

## 2016-02-15 NOTE — Progress Notes (Signed)
    Daily Group Progress Note  Program: IOP   Group Time: 9:00-12:00   Participation Level:  active   Behavioral Response:  responsive  Summary of Progress:  Patient reports feeling anxious today. Shared that she has a broke relationship with her sisters but more so her mother. She was reflecting today on past events that were not pleasant in relation to her mother. No contact with her mother in 2-3 weeks due to to conflict.  Sts that her mother did not pick up the phone but she feels good about taking the first initiated to reach out. She has plans to remedy conflict wither mother.  Patient sts that conflict with family members cause her anxiety. She shared with the group that she utilizes (breathing exercises, positive self talk, relaxing, etc.). Patient remained very positive throughout the group. 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 She spoke of future looking brighter and she looks forward to new endeavors.     [name], [name], LPC
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
## 2016-02-15 NOTE — Progress Notes (Signed)
    Daily Group Progress Note  Program: IOP  Group Time: 9:00-12:00   Participation Level:  active   Behavioral Response: responsive   Type of Therapy:   group therapy   Summary of Progress:  Patient fears that she has a lot of lost time during her childhood from the decade she spent in foster care, but cannot remember. She expressed interest in EMDR therapy as a way to resolve what she feels is a lot of residual anger she has for her mother. At present she is in conflict with her mother, and she feels invalidated and mistreated by her mother. This leads to feelings of anger, and upset. Patient would like to resolve these feelings, and move beyond her anger.   [name], Trudee Chirino B, LPC

## 2016-02-16 ENCOUNTER — Other Ambulatory Visit (HOSPITAL_COMMUNITY): Payer: Managed Care, Other (non HMO) | Admitting: Psychiatry

## 2016-02-16 DIAGNOSIS — F4323 Adjustment disorder with mixed anxiety and depressed mood: Secondary | ICD-10-CM

## 2016-02-16 DIAGNOSIS — F331 Major depressive disorder, recurrent, moderate: Secondary | ICD-10-CM | POA: Diagnosis not present

## 2016-02-17 ENCOUNTER — Other Ambulatory Visit (HOSPITAL_COMMUNITY): Payer: Managed Care, Other (non HMO) | Admitting: Psychiatry

## 2016-02-17 DIAGNOSIS — F331 Major depressive disorder, recurrent, moderate: Secondary | ICD-10-CM | POA: Diagnosis not present

## 2016-02-17 DIAGNOSIS — F4323 Adjustment disorder with mixed anxiety and depressed mood: Secondary | ICD-10-CM

## 2016-02-18 ENCOUNTER — Other Ambulatory Visit (HOSPITAL_COMMUNITY): Payer: Managed Care, Other (non HMO)

## 2016-02-21 ENCOUNTER — Other Ambulatory Visit (HOSPITAL_COMMUNITY): Payer: Managed Care, Other (non HMO)

## 2016-02-21 NOTE — Progress Notes (Signed)
    Daily Group Progress Note  Program: IOP  Program: IOP   Group Time: 9:00-12:00 (9-10 [facility] provided educational information)   Participation Level:  active   Behavioral Response:  responsive  Summary of Progress:  Patient reports feeling pleasant today. Patient's attitude was extremely positive.  Shared that she put herself first in regards to her son. This is something that she has had an issue with in the past. She was reflecting today on past events that were not pleasant in relation to her son. She has previously been afraid to tell him ,"No".   She has plans to continue, "Putting her foot down".  Patient sts that conflict with family members cause her anxiety. She shared with the group that she utilizes (breathing exercises, positive self talk, relaxing, etc.). Patient remained very positive throughout the group. 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 She spoke of future looking brighter and she looks forward to new endeavors that will help her grow as a person.    [name], [name], LPC

## 2016-02-21 NOTE — Progress Notes (Signed)
    Daily Group Progress Note  Program: IOP  Group Time: 9:00-12:00   Participation Level: active   Behavioral Response: responsive   Type of Therapy:   group therapy   Summary of Progress:  Patient was responsive to other group members, and shared a story about feeling offended at work when asked to take off her head scarf. Patient felt as if she was being discriminated against at work, and this greatly upset her.   [name], [name], LPC

## 2016-02-21 NOTE — Progress Notes (Signed)
    Daily Group Progress Note  Program: IOP  Group Time: 9:00-12:00   Participation Level:  active   Behavioral Response:  responsive   Type of Therapy: group therapy   Summary of Progress:  While relating to another pt who struggles to set boundaries with her kids, Pt reports that she needs time to decompress after work, because she can feel inundated by her family after a day of work. However, she's found a way to set boundaries, and this has been helpful for her.  [name], [name], LPC

## 2016-02-21 NOTE — Progress Notes (Signed)
    Daily Group Progress Note  Program: IOP  Group Time: 9:00-12:00   Participation Level:  active   Behavioral Response:  responsive   Type of Therapy:   group therapy   Summary of Progress:  Pt shared that her son is doing well, however she feels some anxiety about his situation, since he is recently out of jail.   [name], Lorrin Nawrot B, LPC

## 2016-02-22 ENCOUNTER — Other Ambulatory Visit (HOSPITAL_COMMUNITY): Payer: Managed Care, Other (non HMO) | Admitting: Psychiatry

## 2016-02-22 DIAGNOSIS — F331 Major depressive disorder, recurrent, moderate: Secondary | ICD-10-CM | POA: Diagnosis not present

## 2016-02-22 DIAGNOSIS — F4323 Adjustment disorder with mixed anxiety and depressed mood: Secondary | ICD-10-CM

## 2016-02-23 ENCOUNTER — Other Ambulatory Visit (HOSPITAL_COMMUNITY): Payer: Managed Care, Other (non HMO) | Admitting: Psychiatry

## 2016-02-23 DIAGNOSIS — F4323 Adjustment disorder with mixed anxiety and depressed mood: Secondary | ICD-10-CM

## 2016-02-23 DIAGNOSIS — F331 Major depressive disorder, recurrent, moderate: Secondary | ICD-10-CM | POA: Diagnosis not present

## 2016-02-24 ENCOUNTER — Other Ambulatory Visit (HOSPITAL_COMMUNITY): Payer: Managed Care, Other (non HMO)

## 2016-02-24 NOTE — Progress Notes (Signed)
    Daily Group Progress Note  Program: IOP  Group Time: 9-12:00  Participation Level: Active  Behavioral Response: Appropriate  Type of Therapy:  Group Therapy  Summary of Progress: Pt. Presented as alert, talkative, engaged in group process. Pt. Discussed holiday plans, new boundaries with her family, and decision not to go to her mother's home because she has not been kind to her or her sister.      [name], Muranda Coye B, LPC

## 2016-02-24 NOTE — Progress Notes (Signed)
    Daily Group Progress Note  Program: IOP  Group Time: 9:00-12:00   Participation Level:  active   Behavioral Response:  engaged   Type of Therapy:  group therapy   Summary of Progress: Patient was alert and active during group.  The patient discussed her optimism related to a new job.  The group discussed mindfulness and simple ways it can be practiced, including using mazes, coloring, or completing word searches.  Group discussed the importance of using senses, observing, describing, focusing on one thing at a time, being fully involved, and being non-judgmental.    [name], [name], LPC

## 2016-02-25 ENCOUNTER — Other Ambulatory Visit (HOSPITAL_COMMUNITY): Payer: Managed Care, Other (non HMO)

## 2016-02-28 ENCOUNTER — Other Ambulatory Visit (HOSPITAL_COMMUNITY): Payer: Managed Care, Other (non HMO) | Admitting: Psychiatry

## 2016-02-28 NOTE — Progress Notes (Signed)
BH IOP DISCHARGE NOTE  Patient:  Ruth SaugerSylvia D Resnik DOB:  01/05/1956  Date of Admission: 02/01/2016  Date of Discharge: 03/01/2016 Reason for Admission:depression  IOP Course:[name] attended and participated.  She improved to the point of less depression but still not sure she will be able to handle her work  Mental Status at Discharge:no suicidal thoughts  Diagnosis: major depression, recurrent moderate  Level of Care:  IOP  Discharge destination: follow up with Dr Lolly MustacheArfeen and Dr West CarboMackinson for therapy   Comments:  none     Carolanne GrumblingGerald Jeweline Reif, MD Patient ID: Ruth SaugerSylvia D Doten, female   DOB: 01/05/1956, 60 y.o.   MRN: [id number]

## 2016-02-29 ENCOUNTER — Other Ambulatory Visit (HOSPITAL_COMMUNITY): Payer: Managed Care, Other (non HMO) | Admitting: Psychiatry

## 2016-02-29 DIAGNOSIS — F331 Major depressive disorder, recurrent, moderate: Secondary | ICD-10-CM

## 2016-02-29 DIAGNOSIS — F419 Anxiety disorder, unspecified: Secondary | ICD-10-CM

## 2016-02-29 DIAGNOSIS — F4323 Adjustment disorder with mixed anxiety and depressed mood: Secondary | ICD-10-CM

## 2016-03-01 ENCOUNTER — Other Ambulatory Visit (HOSPITAL_COMMUNITY): Payer: Managed Care, Other (non HMO)

## 2016-03-03 ENCOUNTER — Other Ambulatory Visit (HOSPITAL_COMMUNITY): Payer: Managed Care, Other (non HMO)

## 2016-03-06 ENCOUNTER — Other Ambulatory Visit (HOSPITAL_COMMUNITY): Payer: Managed Care, Other (non HMO)

## 2016-03-06 ENCOUNTER — Ambulatory Visit (HOSPITAL_COMMUNITY): Payer: Self-pay | Admitting: Psychiatry

## 2016-03-06 NOTE — Patient Instructions (Signed)
Pt completed MH-IOP today.  Will follow up with Dr. Lolly MustacheArfeen on 03-15-16 @ 9 a.m and pt will call Dr. Adelene AmasLinda Makinson for appt.  Encouraged support groups.  Recommended [facility] with Idalia NeedleBeth MacKenzie, LCAS.  RTW on 03-06-16.

## 2016-03-06 NOTE — Progress Notes (Signed)
    Daily Group Progress Note  Program: IOP Group Time: 9:00-12:00   Participation Level:  active  Behavioral Response:  responsive   Type of Therapy:   group therapy   Summary of Progress:  Pt reports doing well and being ready for her discharge. 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 Pt also reports that she had a very open and honest conversation with her mother, where her mother took responsibility for how her mental illness affected the pt and her siblings when they were young. Pt found this conversation very healing.   [name], Nathen Balaban B, LPC

## 2016-03-06 NOTE — Progress Notes (Signed)
Ruth Brown is a 60 y.o. , married, employed, [place], who was a self-referral; treatment for worsening depressive and anxiety symptoms.  Pt is well known to this writer due to past admits in MH-IOP (most recent 09-07-15).  CC:  previous charts for history.  Triggers/Stressors:  1)  Job ([employer]) of seven yrs.  Patient states she had been moved to another area; which has more duties.  Pt hrs are 12n-9 pm.  Pt states husband is still working , so finances aren't a strain any longer.  "We are doing better.  We are working on the marriage, since he doesn't drink (ETOH) as often."  2)  Kids:  Son is out of prison now and staying out of trouble.  He is was working, but wasn't getting any hours.  Has to check in with the parole officer often.  Daughter still in college.  Has been struggling with depression along with SI.  Recently admitted into a hospital.  "She is opening up to me more now; because I didn't know she was feeling that way."  3)  Unresolved grief/loss issues:  Oldest daughter had a miscarriage in April 2017.  According to pt, her daughter is still having a difficult time with her loss.  "I didn't know it would affect me this way, but I've lost who would've been a grandchild."  4)  Online college:  Pt is majoring in [employer].  States she had to withdraw from school temporarily d/t stress.  Pt plans to return, but to only take one class at a time. Pt will be discharged today.  Overall mood improved.  Pt is able to apply coping skills learned.  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  Denies SI/HI and A/V hallucinations.  A:  D/C today.  F/U Dr. Lolly MustacheArfeen on 03-15-16 @ 9 a.m and Dr. [name] (pt will schedule appointment).  Encouraged support groups.  Recommended [facility] with Idalia NeedleBeth MacKenzie, LCAS [phone].  R:  Pt receptive.                                                                     Chestine SporeLARK, [name], M.Ed, CNA

## 2016-03-07 ENCOUNTER — Other Ambulatory Visit (HOSPITAL_COMMUNITY): Payer: Managed Care, Other (non HMO)

## 2016-03-08 ENCOUNTER — Other Ambulatory Visit (HOSPITAL_COMMUNITY): Payer: Managed Care, Other (non HMO)

## 2016-03-09 ENCOUNTER — Other Ambulatory Visit (HOSPITAL_COMMUNITY): Payer: Managed Care, Other (non HMO)

## 2016-03-10 ENCOUNTER — Other Ambulatory Visit (HOSPITAL_COMMUNITY): Payer: Managed Care, Other (non HMO)

## 2016-03-13 ENCOUNTER — Other Ambulatory Visit (HOSPITAL_COMMUNITY): Payer: Managed Care, Other (non HMO)

## 2016-03-14 ENCOUNTER — Other Ambulatory Visit (HOSPITAL_COMMUNITY): Payer: Managed Care, Other (non HMO)

## 2016-03-15 ENCOUNTER — Ambulatory Visit (INDEPENDENT_AMBULATORY_CARE_PROVIDER_SITE_OTHER): Payer: Managed Care, Other (non HMO) | Admitting: Psychiatry

## 2016-03-15 ENCOUNTER — Encounter (HOSPITAL_COMMUNITY): Payer: Self-pay | Admitting: Psychiatry

## 2016-03-15 ENCOUNTER — Other Ambulatory Visit (HOSPITAL_COMMUNITY): Payer: Managed Care, Other (non HMO)

## 2016-03-15 VITALS — BP 112/70 | HR 79 | Ht 60.0 in | Wt 196.6 lb

## 2016-03-15 DIAGNOSIS — Z811 Family history of alcohol abuse and dependence: Secondary | ICD-10-CM

## 2016-03-15 DIAGNOSIS — F330 Major depressive disorder, recurrent, mild: Secondary | ICD-10-CM

## 2016-03-15 DIAGNOSIS — Z79899 Other long term (current) drug therapy: Secondary | ICD-10-CM | POA: Diagnosis not present

## 2016-03-15 DIAGNOSIS — Z818 Family history of other mental and behavioral disorders: Secondary | ICD-10-CM | POA: Diagnosis not present

## 2016-03-15 MED ORDER — MIRTAZAPINE 30 MG PO TABS: 30.0000 mg | ORAL_TABLET | Freq: Every day | ORAL | 0 refills | Status: DC

## 2016-03-15 MED ORDER — CITALOPRAM HYDROBROMIDE 20 MG PO TABS: 20.0000 mg | ORAL_TABLET | Freq: Every day | ORAL | 0 refills | Status: DC

## 2016-03-15 NOTE — Progress Notes (Signed)
BH MD/PA/NP OP Progress Note  03/15/2016 9:31 AM Ruth Brown  MRN:  [id number]  Chief Complaint:  Chief Complaint    Follow-up; Anxiety     Subjective:  I'm doing better.  I still have trouble sleeping and get anxious.  HPI: Ruth Brown came for her follow-up appointment.  She is taking Celexa and Remeron 15 mg at bedtime.  She was recommended intensive outpatient program few months ago when she was noncompliant with medications.  Seeing increased anxiety depression and could not function .  She was seen by covering physician Dr Duane LopeHasad who recommended intensive outpatient program.  Patient finished the program and resume her medication.  She still feel anxious some time and difficulty sleeping.  Her son release from the jail in May now living with her and looking for a job.  Her younger daughter is also doing better since she released from the hospital office [place] attempt in [place].  She finally had a good Thanksgiving.  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  She denies any irritability, anger, mania, psychosis but she still feels some time anxious nervous and overwhelmed.  She is seeing Marikay AlarLinda Mack concern for counseling.  She reported no side effects from medication.  She recently seen her primary care physician and her blood work was done.  I review her blood work which was done on November 13 .  Her BUN is 13 , her AST 18 and her ALT is 13.  Her creatinine is 0.61.  Her glucose is 91.  Her total cholesterol was 172 but her LDL was 111.  Her TSH is 1.24 is normal.  Her CBC is also normal.  Patient denies drinking alcohol or using any illegal substances.  Her appetite is okay.  But she gained a few pounds but overall she is active .  Her vital signs are stable.  She denies any suicidal parts or paranoia.    Visit Diagnosis:    ICD-9-CM  ICD-10-CM   1. Major depressive disorder, recurrent episode, mild (HCC) 296.31 F33.0 mirtazapine (REMERON) 30 MG tablet     citalopram (CELEXA) 20 MG tablet    Past Psychiatric History: Patient has history of major depression and in the past she had taken Paxil but stopped due to not working.  She has at least twice intensive outpatient program.  Patient denies any history of paranoia, hallucination, nightmares, flashback or any suicidal attempt.  Past Medical History:  Past Medical History:  Diagnosis Date  . Anxiety   . Depression   . HTN (hypertension)   . Hyperlipidemia     Past Surgical History:  Procedure Laterality Date  . CESAREAN SECTION      Family Psychiatric History: See below.  Family History:  Family History  Problem Relation Age of Onset  . Depression Mother   . Anxiety disorder Mother   . Alcohol abuse Mother   . Depression Maternal Aunt   . Anxiety disorder Maternal Aunt     Social History:  Social History   Social History  . Marital status: Married    Spouse name: N/A  . Number of children: N/A  . Years of education: N/A   Social History Main Topics  . Smoking status: Never Smoker  . Smokeless tobacco: Never Used  . Alcohol use No  . Drug use:  No  . Sexual activity: Yes   Other Topics Concern  . None   Social History Narrative  . None    Allergies: No Known Allergies  Metabolic Disorder Labs: No results found for: HGBA1C, MPG No results found for: PROLACTIN No results found for: CHOL, TRIG, HDL, CHOLHDL, VLDL, LDLCALC   Current Medications: Current Outpatient Prescriptions  Medication Sig Dispense Refill  . citalopram (CELEXA) 20 MG tablet Take 1 tablet (20 mg total) by mouth daily. 90 tablet 0  . diclofenac (VOLTAREN) 75 MG EC tablet Take 75 mg by mouth 2 (two) times daily.    [name] lisinopril-hydrochlorothiazide (PRINZIDE,ZESTORETIC) 10-12.5 MG per tablet Take 1 tablet by mouth daily.    . mirtazapine (REMERON) 30 MG tablet Take 1  tablet (30 mg total) by mouth at bedtime. 90 tablet 0  . simvastatin (ZOCOR) 20 MG tablet Take 20 mg by mouth every other day.     [name] VIMOVO 500-20 MG TBEC Take 500 mg by mouth daily.    . vitamin B-12 (CYANOCOBALAMIN) 100 MCG tablet Take 100 mcg by mouth daily.     No current facility-administered medications for this visit.     Neurologic: Headache: No Seizure: No Paresthesias: No  Musculoskeletal: Strength & Muscle Tone: within normal limits Gait & Station: normal Patient leans: N/A  Psychiatric Specialty Exam: Review of Systems  Constitutional: Negative.  Negative for weight loss.  HENT: Negative.   Eyes: Negative.   Respiratory: Negative.   Cardiovascular: Negative.   Gastrointestinal: Negative.   Musculoskeletal: Negative.   Skin: Negative.   Neurological: Negative.  Negative for tremors.  Psychiatric/Behavioral: The patient is nervous/anxious.     Blood pressure 112/70, pulse 79, height 5' (1.524 m), weight 196 lb 9.6 oz (89.2 kg).Body mass index is 38.4 kg/m.  General Appearance: Casual  Eye Contact:  Fair  Speech:  Clear and Coherent  Volume:  Normal  Mood:  Anxious  Affect:  Congruent  Thought Process:  Coherent  Orientation:  Full (Time, Place, and Person)  Thought Content: WDL and Logical   Suicidal Thoughts:  No  Homicidal Thoughts:  No  Memory:  Immediate;   Good Recent;   Good Remote;   Good  Judgement:  Good  Insight:  Good  Psychomotor Activity:  Normal  Concentration:  Concentration: Fair and Attention Span: Fair  Recall:  Good  Fund of Knowledge: Good  Language: Good  Akathisia:  No  Handed:  Right  AIMS (if indicated):  None reported   Assets:  Communication Skills Desire for Improvement Financial Resources/Insurance Housing Resilience Social Support Talents/Skills Transportation  ADL's:  Intact  Cognition: WNL  Sleep:  Fair      Treatment Plan Summary:[name] is 60 year old African-American female who has depression and anxiety  symptoms.  Plan; Patient is a stable on her current psychiatric medication however she continues to have anxiety and poor sleep.  I will increase Remeron 30 mg at bedtime.  In the past she has taken 30 mg with good response.  I also reviewed collateral information from intensive outpatient program, discharge summary from the program and blood results from her primary care physician. She is taking Celexa 20 mg daily.  She has no tremors shakes or any EPS.  I recommended to continue to see Bonita Quin for her therapy.  Encouraged to watch her calorie intake as Remeron can cause weight gain.  Discussed psychosocial stressors in length.  Recommended to call us back if she has any question, concern or if she feels  worsening of the symptom.  Follow-up in 3 months.  Toryn Mcclinton T., MD 03/15/2016, 9:31 AM

## 2016-03-16 ENCOUNTER — Other Ambulatory Visit (HOSPITAL_COMMUNITY): Payer: Managed Care, Other (non HMO)

## 2016-03-17 ENCOUNTER — Other Ambulatory Visit (HOSPITAL_COMMUNITY): Payer: Managed Care, Other (non HMO)

## 2016-03-20 ENCOUNTER — Other Ambulatory Visit (HOSPITAL_COMMUNITY): Payer: Managed Care, Other (non HMO)

## 2016-03-21 ENCOUNTER — Other Ambulatory Visit (HOSPITAL_COMMUNITY): Payer: Managed Care, Other (non HMO)

## 2016-03-22 ENCOUNTER — Other Ambulatory Visit (HOSPITAL_COMMUNITY): Payer: Managed Care, Other (non HMO)

## 2016-03-23 ENCOUNTER — Other Ambulatory Visit (HOSPITAL_COMMUNITY): Payer: Managed Care, Other (non HMO)

## 2016-03-24 ENCOUNTER — Other Ambulatory Visit (HOSPITAL_COMMUNITY): Payer: Managed Care, Other (non HMO)

## 2016-03-27 ENCOUNTER — Other Ambulatory Visit (HOSPITAL_COMMUNITY): Payer: Managed Care, Other (non HMO)

## 2016-05-01 ENCOUNTER — Ambulatory Visit (HOSPITAL_COMMUNITY): Payer: Self-pay | Admitting: Psychiatry

## 2016-06-13 ENCOUNTER — Ambulatory Visit (HOSPITAL_COMMUNITY): Payer: Self-pay | Admitting: Psychiatry

## 2016-07-06 ENCOUNTER — Encounter (HOSPITAL_COMMUNITY): Payer: Self-pay | Admitting: Psychiatry

## 2016-07-06 ENCOUNTER — Ambulatory Visit (INDEPENDENT_AMBULATORY_CARE_PROVIDER_SITE_OTHER): Payer: Managed Care, Other (non HMO) | Admitting: Psychiatry

## 2016-07-06 DIAGNOSIS — F4321 Adjustment disorder with depressed mood: Secondary | ICD-10-CM | POA: Diagnosis not present

## 2016-07-06 DIAGNOSIS — Z811 Family history of alcohol abuse and dependence: Secondary | ICD-10-CM

## 2016-07-06 DIAGNOSIS — F330 Major depressive disorder, recurrent, mild: Secondary | ICD-10-CM

## 2016-07-06 DIAGNOSIS — Z79899 Other long term (current) drug therapy: Secondary | ICD-10-CM

## 2016-07-06 DIAGNOSIS — Z818 Family history of other mental and behavioral disorders: Secondary | ICD-10-CM

## 2016-07-06 MED ORDER — CITALOPRAM HYDROBROMIDE 20 MG PO TABS: 20.0000 mg | ORAL_TABLET | Freq: Every day | ORAL | 0 refills | Status: DC

## 2016-07-06 MED ORDER — MIRTAZAPINE 30 MG PO TABS: 30.0000 mg | ORAL_TABLET | Freq: Every day | ORAL | 0 refills | Status: DC

## 2016-07-06 NOTE — Progress Notes (Signed)
BH MD/PA/NP OP Progress Note  07/06/2016 3:33 PM Ruth SaugerSylvia D Brown  MRN:  [id number]  Chief Complaint:  Subjective:  I'm sad because my father died a month ago.  I'm going through grief.  HPI: Ruth ElmSylvia came for her follow-up appointment.  On her last visit we increased Remeron and she sleeping better but last month her father died who was 59 year old and having health issues.  She endorse feeling sad depressed and going through grief.  She like to restart intensive outpatient program and wondering if she is eligible.  She had been in intensive audition program and her last program was completed in November.  She's also taking Celexa 20 mg.  She is seeing therapist Ruth LundLinda Brown.  She is pleased that her son is able to get job and her daughter is also doing very well lives in [place].  She denies any irritability, anger, mania but endorsed some time crying spells and sadness.  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  She admitted get some time tired and overwhelmed but she is happy that things are going well.  She denies any suicidal thoughts or homicidal thought.  She denies drinking alcohol or using any illegal substances.  Her energy level is fair.  Her vital signs are stable.  Visit Diagnosis:    ICD-9-CM ICD-10-CM   1. Major depressive disorder, recurrent episode, mild (HCC) 296.31 F33.0 mirtazapine (REMERON) 30 MG tablet     citalopram (CELEXA) 20 MG tablet    Past Psychiatric History: Reviewed. Patient has history of major depression and in the past she had taken Paxil but stopped due to not working.  She has at least twice intensive outpatient program.  Patient denies any history of paranoia, hallucination, nightmares, flashback or any suicidal attempt.  Past Medical History:  Past Medical History:  Diagnosis Date  . Anxiety   . Depression   . HTN (hypertension)   . Hyperlipidemia     Past Surgical History:  Procedure Laterality Date  . CESAREAN  SECTION      Family Psychiatric History: Reviewed.  Family History:  Family History  Problem Relation Age of Onset  . Depression Mother   . Anxiety disorder Mother   . Alcohol abuse Mother   . Depression Maternal Aunt   . Anxiety disorder Maternal Aunt     Social History:  Social History   Social History  . Marital status: Married    Spouse name: N/A  . Number of children: N/A  . Years of education: N/A   Social History Main Topics  . Smoking status: Never Smoker  . Smokeless tobacco: Never Used  . Alcohol use No  . Drug use: No  . Sexual activity: Yes   Other Topics Concern  . Not on file   Social History Narrative  . No narrative on file    Allergies: No Known Allergies  Metabolic Disorder Labs: No results found for: HGBA1C, MPG No results found for: PROLACTIN No results found for: CHOL, TRIG, HDL, CHOLHDL, VLDL, LDLCALC   Current Medications: Current Outpatient Prescriptions  Medication Sig Dispense Refill  . citalopram (CELEXA) 20 MG tablet Take 1 tablet (20 mg total) by mouth daily. 90 tablet 0  . diclofenac (VOLTAREN) 75 MG EC tablet Take 75 mg by mouth 2 (two) times daily.    [name]. lisinopril-hydrochlorothiazide (PRINZIDE,ZESTORETIC) 10-12.5 MG per tablet Take 1 tablet by mouth daily.    . mirtazapine (REMERON) 30 MG tablet Take 1  tablet (30 mg total) by mouth at bedtime. 90 tablet 0  . simvastatin (ZOCOR) 20 MG tablet Take 20 mg by mouth every other day.     [name] VIMOVO 500-20 MG TBEC Take 500 mg by mouth daily.    . vitamin B-12 (CYANOCOBALAMIN) 100 MCG tablet Take 100 mcg by mouth daily.     No current facility-administered medications for this visit.     Neurologic: Headache: No Seizure: No Paresthesias: No  Musculoskeletal: Strength & Muscle Tone: within normal limits Gait & Station: normal Patient leans: N/A  Psychiatric Specialty Exam: ROS  There were no vitals taken for this visit.There is no height or weight on file to calculate BMI.   General Appearance: Casual  Eye Contact:  Good  Speech:  Clear and Coherent  Volume:  Normal  Mood:  Anxious  Affect:  Congruent and Depressed  Thought Process:  Goal Directed  Orientation:  Full (Time, Place, and Person)  Thought Content: Rumination   Suicidal Thoughts:  No  Homicidal Thoughts:  No  Memory:  Immediate;   Fair Recent;   Good Remote;   Good  Judgement:  Good  Insight:  Good  Psychomotor Activity:  Decreased  Concentration:  Concentration: Fair and Attention Span: Good  Recall:  Good  Fund of Knowledge: Good  Language: Good  Akathisia:  No  Handed:  Right  AIMS (if indicated):  0  Assets:  Communication Skills Desire for Improvement Housing Resilience Social Support  ADL's:  Intact  Cognition: WNL  Sleep:  Good     Assessment: Major depressive disorder, recurrent.  Grief.  Plan: Discuss psychosocial stressors.  Patient like to know if she can start intensive outpatient program.  Discussed program coordinator if she is eligible for intensive outpatient program.  Coordinator will call the patient about the outcome.  Continue Remeron 30 mg at bedtime and Celexa 20 mg daily.  Patient is requesting FMLA so she can take time off for counseling.  Patient is seeing therapist Adelene Amas I suggested to have required paper sent from human resources .  Recommended to call us back if she has any question, concern or if she feels worsening of the symptom.  Follow-up in 3 months.   Kennth Vanbenschoten T., MD 07/06/2016, 3:33 PM

## 2016-07-17 ENCOUNTER — Encounter (HOSPITAL_COMMUNITY): Payer: Self-pay | Admitting: Psychiatry

## 2016-07-17 ENCOUNTER — Other Ambulatory Visit (HOSPITAL_COMMUNITY): Payer: Managed Care, Other (non HMO) | Attending: Psychiatry | Admitting: Psychiatry

## 2016-07-17 DIAGNOSIS — F419 Anxiety disorder, unspecified: Secondary | ICD-10-CM | POA: Insufficient documentation

## 2016-07-17 DIAGNOSIS — F331 Major depressive disorder, recurrent, moderate: Secondary | ICD-10-CM | POA: Insufficient documentation

## 2016-07-17 DIAGNOSIS — F4320 Adjustment disorder, unspecified: Secondary | ICD-10-CM | POA: Insufficient documentation

## 2016-07-17 DIAGNOSIS — Z79899 Other long term (current) drug therapy: Secondary | ICD-10-CM | POA: Insufficient documentation

## 2016-07-17 DIAGNOSIS — I10 Essential (primary) hypertension: Secondary | ICD-10-CM | POA: Insufficient documentation
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
## 2016-07-17 NOTE — Progress Notes (Signed)
Psychiatric Initial Adult Assessment   Patient Identification: Ruth Brown MRN:  [id number] Date of Evaluation:  07/17/2016 Referral Source: Dr Lolly Mustache Chief Complaint: depression and grief  Visit Diagnosis: major depression, recurrent moderate.  Grief reaction  History of Present Illness:  Ms Ruth Brown has been in the IOP in the past and returns after her 78 year ol father died at the end of 06/24/2022.  She handled all the issues around his death and with her mother and siblings.  She stayed busy doing all that but when she returned to work found herself both grieving and beginning to get depressed again.  She was having a hard time focusing, crying at work, feeling tired, having trouble sleeping and just not able to do the job.  She was afraid she would slip into the depression she has had for years after doing so well recently.  Her worries over her son and daughter are much less.  She wants to change jobs but will give it another couple of years till her daughter graduates college.  Things overall are better.  She was close to both her parents and misses her father and did not expect to have this much depression.  Associated Signs/Symptoms: Depression Symptoms:  depressed mood, anhedonia, insomnia, fatigue, difficulty concentrating, impaired memory, anxiety, (Hypo) Manic Symptoms:  Irritable Mood, Anxiety Symptoms:  Excessive Worry, Psychotic Symptoms:  none PTSD Symptoms: none  Past Psychiatric History: several IOP stays and ongoing outpatient therapy and med management  Previous Psychotropic Medications: Yes   Substance Abuse History in the last 12 months:  No.  Consequences of Substance Abuse: Negative  Past Medical History:  Past Medical History:  Diagnosis Date  . Anxiety   . Depression   . HTN (hypertension)   . Hyperlipidemia     Past Surgical History:  Procedure Laterality Date  . CESAREAN SECTION      Family Psychiatric History: depression, alcoholism and  anxiety in the family  Family History:  Family History  Problem Relation Age of Onset  . Depression Mother   . Anxiety disorder Mother   . Alcohol abuse Mother   . Depression Maternal Aunt   . Anxiety disorder Maternal Aunt     Social History:   Social History   Social History  . Marital status: Married    Spouse name: N/A  . Number of children: N/A  . Years of education: N/A   Social History Main Topics  . Smoking status: Never Smoker  . Smokeless tobacco: Never Used  . Alcohol use No  . Drug use: No  . Sexual activity: Yes   Other Topics Concern  . None   Social History Narrative  . None    Additional Social History: none  Allergies:  No Known Allergies  Metabolic Disorder Labs: No results found for: HGBA1C, MPG No results found for: PROLACTIN No results found for: CHOL, TRIG, HDL, CHOLHDL, VLDL, LDLCALC   Current Medications: Current Outpatient Prescriptions  Medication Sig Dispense Refill  . citalopram (CELEXA) 20 MG tablet Take 1 tablet (20 mg total) by mouth daily. 90 tablet 0  . lisinopril-hydrochlorothiazide (PRINZIDE,ZESTORETIC) 10-12.5 MG per tablet Take 1 tablet by mouth daily.    . mirtazapine (REMERON) 30 MG tablet Take 1 tablet (30 mg total) by mouth at bedtime. 90 tablet 0  . simvastatin (ZOCOR) 20 MG tablet Take 20 mg by mouth every other day.     [name] VIMOVO 500-20 MG TBEC Take 500 mg by mouth daily.    [name]  vitamin B-12 (CYANOCOBALAMIN) 100 MCG tablet Take 100 mcg by mouth daily.     No current facility-administered medications for this visit.     Neurologic: Headache: Negative Seizure: Negative Paresthesias:Negative  Musculoskeletal: Strength & Muscle Tone: within normal limits Gait & Station: normal Patient leans: N/A  Psychiatric Specialty Exam: ROS  There were no vitals taken for this visit.There is no height or weight on file to calculate BMI.  General Appearance: Well Groomed  Eye Contact:  Good  Speech:  Clear and Coherent   Volume:  Normal  Mood:  Depressed  Affect:  Congruent  Thought Process:  Coherent and Goal Directed  Orientation:  Full (Time, Place, and Person)  Thought Content:  Logical  Suicidal Thoughts:  No  Homicidal Thoughts:  No  Memory:  Immediate;   Good Recent;   Good Remote;   Good  Judgement:  Good  Insight:  Good  Psychomotor Activity:  Normal  Concentration:  Concentration: Good and Attention Span: Good  Recall:  Good  Fund of Knowledge:Good  Language: Good  Akathisia:  Negative  Handed:  Right  AIMS (if indicated):  0  Assets:  Communication Skills Desire for Improvement Financial Resources/Insurance Housing Intimacy Leisure Time Physical Health Resilience Social Support Talents/Skills Transportation Vocational/Educational  ADL's:  Intact  Cognition: WNL  Sleep:  Improving with medication    Treatment Plan Summary: Admit to IOP with daily group therapy .  No med changes indicated   Carolanne Grumbling, MD 4/9/20181:32 PM

## 2016-07-18 ENCOUNTER — Other Ambulatory Visit (HOSPITAL_COMMUNITY): Payer: Managed Care, Other (non HMO) | Admitting: Psychiatry

## 2016-07-18 DIAGNOSIS — Z79899 Other long term (current) drug therapy: Secondary | ICD-10-CM | POA: Diagnosis not present

## 2016-07-18 DIAGNOSIS — F419 Anxiety disorder, unspecified: Secondary | ICD-10-CM | POA: Diagnosis not present

## 2016-07-18 DIAGNOSIS — F4320 Adjustment disorder, unspecified: Secondary | ICD-10-CM | POA: Diagnosis not present

## 2016-07-18 DIAGNOSIS — F331 Major depressive disorder, recurrent, moderate: Secondary | ICD-10-CM | POA: Diagnosis present

## 2016-07-18 DIAGNOSIS — I10 Essential (primary) hypertension: Secondary | ICD-10-CM | POA: Diagnosis not present

## 2016-07-18 NOTE — Progress Notes (Signed)
    Daily Group Progress Note  Program: IOP  Group Time: 9:00-12:00  Participation Level: Active  Behavioral Response: Appropriate  Type of Therapy:  Group Therapy  Summary of Progress: Pt. Presents as talkative, brightened affect, engaged in the therapeutic process. Pt. Continues to process stress related to work and recent loss of her father. Pt. Connected with other patients around the need for self-care and checking in with self-regularly in order to be responsive to emotional and physical needs. Pt. Participated in discussion about the feelings wheel and using mindfulness to identify and be present with difficult emotions.      Shaune Pollack, LPC

## 2016-07-19 ENCOUNTER — Other Ambulatory Visit (HOSPITAL_COMMUNITY): Payer: Managed Care, Other (non HMO) | Admitting: Psychiatry

## 2016-07-19 DIAGNOSIS — F331 Major depressive disorder, recurrent, moderate: Secondary | ICD-10-CM | POA: Diagnosis not present

## 2016-07-20 ENCOUNTER — Other Ambulatory Visit (HOSPITAL_COMMUNITY): Payer: Managed Care, Other (non HMO) | Admitting: Psychiatry

## 2016-07-20 DIAGNOSIS — F331 Major depressive disorder, recurrent, moderate: Secondary | ICD-10-CM | POA: Diagnosis not present

## 2016-07-21 ENCOUNTER — Other Ambulatory Visit (HOSPITAL_COMMUNITY): Payer: Managed Care, Other (non HMO) | Admitting: Psychiatry

## 2016-07-21 DIAGNOSIS — F331 Major depressive disorder, recurrent, moderate: Secondary | ICD-10-CM

## 2016-07-24 ENCOUNTER — Other Ambulatory Visit (HOSPITAL_COMMUNITY): Payer: Managed Care, Other (non HMO)

## 2016-07-25 ENCOUNTER — Other Ambulatory Visit (HOSPITAL_COMMUNITY): Payer: Managed Care, Other (non HMO) | Admitting: Psychiatry

## 2016-07-25 DIAGNOSIS — F331 Major depressive disorder, recurrent, moderate: Secondary | ICD-10-CM | POA: Diagnosis not present

## 2016-07-25 NOTE — Progress Notes (Signed)
    Daily Group Progress Note  Program: IOP  Group Time: 9:00-12:00  Participation Level: Active  Behavioral Response: Appropriate  Type of Therapy:  Group Therapy  Summary of Progress: Pt. Presents as engaged in the group process, talkative, received feedback from others. Pt. Continues to be challenged by setting healthy boundaries with family and community with tendency to overextend herself. Pt. Is aware of these patterns of overextending herself and is working on making her self-care a priority.     Shaune Pollack, LPC

## 2016-07-26 ENCOUNTER — Other Ambulatory Visit (HOSPITAL_COMMUNITY): Payer: Managed Care, Other (non HMO) | Admitting: Psychiatry

## 2016-07-26 DIAGNOSIS — F331 Major depressive disorder, recurrent, moderate: Secondary | ICD-10-CM | POA: Diagnosis not present

## 2016-07-26 NOTE — Progress Notes (Signed)
    Daily Group Progress Note  Program: IOP  Group Time: 9:00-12:00   Participation Level:  minimal   Behavioral Response:  responsive   Type of Therapy:   group therapy  Summary of Progress: Pt related to an ongoing conversation about expressing needs in order for them to be met, boundaries, and communication. Pt is currently allowing her son's chronically ill girlfriend to live in her house, and has grown increasingly frustrated by this situation. Pt. Participated in grief and loss facilitated by the Chaplain.  Nancie Neas, LPC

## 2016-07-26 NOTE — Progress Notes (Signed)
    Daily Group Progress Note  Program: IOP  Group Time: 9:00-12:00   Participation Level:  active    Behavioral Response:  engaged   Type of Therapy:   group therapy   Summary of Progress: Pt. was a little late today but overall seemed to be in a good place.  She stated that she was more "positive" and "chill".  She started using the sleep app called slumber and that helped her sleep.  She was able to place a boundary with her husband who is a "night owl" and tell him that she really needed to sleep.  She stated previously that this would have been a big argument but she had made some shifts in the ways in which she responds to him.  Shaune Pollack, LPC
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
## 2016-07-27 ENCOUNTER — Other Ambulatory Visit (HOSPITAL_COMMUNITY): Payer: Managed Care, Other (non HMO) | Admitting: Psychiatry

## 2016-07-27 DIAGNOSIS — F331 Major depressive disorder, recurrent, moderate: Secondary | ICD-10-CM
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
## 2016-07-28 ENCOUNTER — Other Ambulatory Visit (HOSPITAL_COMMUNITY): Payer: Managed Care, Other (non HMO) | Admitting: Psychiatry

## 2016-07-28 DIAGNOSIS — F331 Major depressive disorder, recurrent, moderate: Secondary | ICD-10-CM

## 2016-07-31 ENCOUNTER — Other Ambulatory Visit (HOSPITAL_COMMUNITY): Payer: Managed Care, Other (non HMO) | Admitting: Psychiatry

## 2016-07-31 DIAGNOSIS — F331 Major depressive disorder, recurrent, moderate: Secondary | ICD-10-CM

## 2016-07-31 NOTE — Progress Notes (Signed)
    Daily Group Progress Note  Program: IOP  Group Time: 9:00-12:00   Participation Level:  active    Behavioral Response:  responsive   Type of Therapy:   group therapy  Summary of Progress: Pt had a conversation with her husband about the progress she's been making in group. He reflected to her that her attending group seems to help their relationship, and he discussed with her how he feels she often interrupts, which frustrates him. She listened to him and promised to work on this tendency. Pt is excited because she will be making vision boards with some friends this weekend.  The second half of the group was focused on grief and loss.  Shaune Pollack, LPC

## 2016-08-01 ENCOUNTER — Other Ambulatory Visit (HOSPITAL_COMMUNITY): Payer: Managed Care, Other (non HMO) | Admitting: Psychiatry

## 2016-08-01 DIAGNOSIS — F331 Major depressive disorder, recurrent, moderate: Secondary | ICD-10-CM

## 2016-08-02 ENCOUNTER — Other Ambulatory Visit (HOSPITAL_COMMUNITY): Payer: Managed Care, Other (non HMO) | Admitting: Psychiatry

## 2016-08-02 DIAGNOSIS — F331 Major depressive disorder, recurrent, moderate: Secondary | ICD-10-CM | POA: Diagnosis not present

## 2016-08-02 NOTE — Progress Notes (Signed)
    Daily Group Progress Note  Program: IOP  Group Time: 9:00-12:00   Participation Level: active   Behavioral Response: engaged   Type of Therapy:   group therapy   Summary of Progress: Pt. Participated in medication management group with the pharmacist. Pt. arrived late to group today but was present with people's stories and engaged with the process.  She shared feeling closer to some of her family members recently and reported having a good weekend.  With her father's passing she is happy that she can connect more with her family.   Shaune Pollack, LPC

## 2016-08-02 NOTE — Progress Notes (Signed)
    Daily Group Progress Note  Program: IOP  Group Time: 9:00-12:00   Participation Level:  active   Behavioral Response:  engaged   Type of Therapy: group therapy   Summary of Progress: Pt.  was very direct with another Pt. about his relationship and shared her opinions about his girlfriend's "neediness".  Pt.'s feedback appeared to come from a place of knowing what it was like to be in a challenging relationships and knowing that dynamics need to change in order to become more healthy and well on your own.  Shaune Pollack, LPC

## 2016-08-02 NOTE — Progress Notes (Signed)
    Daily Group Progress Note  Program: IOP  Group Time: 9:00-12:00  Participation Level: Active  Behavioral Response: Appropriate  Type of Therapy:  Group Therapy  Summary of Progress: Pt. Presents as talkative and engaged in the group process. Pt. Continues to process grief related to loss of her father and developing and enforcing healthy boundaries with her family. Pt. Reported that the cognitive model that she developed in yesterday's group was helpful for her. Pt. Received feedback and encouragment from the group related to setting healthy boundaries. Pt. Participated in guided meditation breathing exercise for grounding and relaxation.      Shaune Pollack, LPC

## 2016-08-03 ENCOUNTER — Other Ambulatory Visit (HOSPITAL_COMMUNITY): Payer: Managed Care, Other (non HMO)

## 2016-08-03 NOTE — Progress Notes (Signed)
    Daily Group Progress Note  Program: IOP  Group Time: 9:00-12:00   Participation Level:  active   Behavioral Response:  engaged   Type of Therapy:   group therapy  Summary of Progress: Pt. Presents as talkative, engaged in the group process. Pt. feels positive about many areas of her life, however she continues to struggle with asserting her desire for her sons girlfriend to move out of her house. The group worked through an "unintentional model" around this situation, and pt discovered a new way of handling the situation which she hopes to soon enact.   Shaune Pollack, LPC

## 2016-08-04 ENCOUNTER — Other Ambulatory Visit (HOSPITAL_COMMUNITY): Payer: Managed Care, Other (non HMO) | Admitting: Psychiatry

## 2016-08-04 DIAGNOSIS — F331 Major depressive disorder, recurrent, moderate: Secondary | ICD-10-CM | POA: Diagnosis not present

## 2016-08-07 ENCOUNTER — Other Ambulatory Visit (HOSPITAL_COMMUNITY): Payer: Managed Care, Other (non HMO) | Admitting: Psychiatry

## 2016-08-07 DIAGNOSIS — F331 Major depressive disorder, recurrent, moderate: Secondary | ICD-10-CM | POA: Diagnosis not present

## 2016-08-07 NOTE — Progress Notes (Signed)
    Daily Group Progress Note  Program: IOP  Group Time: 9:00-12:00   Participation Level: active   Behavioral Response: engaged   Type of Therapy: group therapy  Summary of Progress: Pt shared about the ongoing situation she's having where her sons girlfriend is living at her house. Though she has not kicked her out, she spoke with her son about the situation, and feels that they have come to a greater understanding. During the grief and loss portion of group pt shared about the strange effect her father's death has had on her family; its brought them together. Though she's sad for his loss, she feels a sense of peace with how connected her family has in turn become.   Shaune Pollack, LPC

## 2016-08-08 ENCOUNTER — Other Ambulatory Visit (HOSPITAL_COMMUNITY): Payer: Managed Care, Other (non HMO) | Attending: Psychiatry

## 2016-08-08 DIAGNOSIS — F4321 Adjustment disorder with depressed mood: Secondary | ICD-10-CM | POA: Insufficient documentation

## 2016-08-08 DIAGNOSIS — F331 Major depressive disorder, recurrent, moderate: Secondary | ICD-10-CM | POA: Insufficient documentation

## 2016-08-09 ENCOUNTER — Other Ambulatory Visit (HOSPITAL_COMMUNITY): Payer: Managed Care, Other (non HMO) | Admitting: Psychiatry

## 2016-08-09 DIAGNOSIS — F331 Major depressive disorder, recurrent, moderate: Secondary | ICD-10-CM

## 2016-08-09 DIAGNOSIS — F4321 Adjustment disorder with depressed mood: Secondary | ICD-10-CM | POA: Diagnosis present

## 2016-08-09 NOTE — Progress Notes (Signed)
[facility] IOP DISCHARGE NOTE  Patient:  Ruth Brown DOB:  16-Jul-1955  Date of Admission: 07/17/2016  Date of Discharge: 08/09/2016  Reason for Admission:depression and grief  IOP Course:attended and participated.  Feeling much better in all areas of her life.  Plans to return to work next week.  Mental Status at Discharge:no suicidal thoughts  Diagnosis: major depression, recurrent moderate  Level of Care:  IOP  Discharge destination:  Has appointment with her psychiatrist and will make one with her therapist  Comments: continue citalopram 20 mg daily, mirtazepine 30 mg hs  The patient received suicide prevention pamphlet:  Yes   Carolanne Grumbling, MD

## 2016-08-09 NOTE — Patient Instructions (Signed)
D:  Pt successfully completed MH-IOP today.  A:  Discharge today.  Will follow up with Dr. Lolly Mustache on 08-31-16 @ 2 pm and will contact Dr. Lala Lund for an appointment.  Return to work on 08-14-16; without any restrictions.  R:  Pt receptive.

## 2016-08-09 NOTE — Progress Notes (Signed)
    Daily Group Progress Note  Program: IOP  Group Time: 9:00-12:00  Participation Level: Active  Behavioral Response: Appropriate  Type of Therapy:  Group Therapy  Summary of Progress: Pt. Participated in medication management group with the Chaplain. Pt. Continues to present as talkative and engaged in the group process. Pt. Provided feedback for other group members about developing boundaries in personal relationships. Pt. Discussed her preparation for discharge from the group on Wednesday.      Shaune Pollack, LPC

## 2016-08-09 NOTE — Progress Notes (Signed)
Ruth Brown is a 61 y.o. ,married, employed, [place] American female, who was a self referral.  Pt is well known to this Clinical research associate due to prior admissions; one most recently was October 2017.  CC:  previous charts for detailed chart information.  Trigger:  1)  June 28, 2016 75 yr old father died of medical complications.  He was put on life support and pt was in charge of telling the doctor to turn it off.  Pt states she went back to work two weeks ago and fell apart.  "I can't sleep without thinking about my father and what all he said to me." Pt will be discharged from [facility] today.  Reports feeling much better.  C/O Remeron making her feel sedated at times.  Practicing setting better boundaries with family.  Husband started a new job.  Pt continues to grieve the loss of her father.  Denies SI/HI or A/V hallucinations.  A:  D/C today.  F/U with Dr. Lolly Mustache on 08-31-16 @ 2 pm and Dr. Lala Lund (Pt will call for appt).  RTW on 08-14-16.  R:  Pt receptive.        Chestine Spore, [name], M.Ed, CNA

## 2016-08-10 ENCOUNTER — Other Ambulatory Visit (HOSPITAL_COMMUNITY): Payer: Managed Care, Other (non HMO)

## 2016-08-11 ENCOUNTER — Other Ambulatory Visit (HOSPITAL_COMMUNITY): Payer: Managed Care, Other (non HMO)

## 2016-08-11 NOTE — Progress Notes (Signed)
    Daily Group Progress Note  Program: IOP  Group Time: 9:00-12:00  Participation Level: Active  Behavioral Response: Appropriate  Type of Therapy:  Group Therapy  Summary of Progress: Pt. Prepared for discharge and met with the psychiatrist and case manager. Pt. Reported that the day prior had been a good day providing care for her mother. Pt. Stated that she was patient with her mother, able to problem solve for her mother. Pt. Received positive feedback and encouragement from the group about discharge.     Nancie Neas, LPC

## 2016-08-14 ENCOUNTER — Other Ambulatory Visit (HOSPITAL_COMMUNITY): Payer: Managed Care, Other (non HMO)

## 2016-08-15 ENCOUNTER — Other Ambulatory Visit (HOSPITAL_COMMUNITY): Payer: Managed Care, Other (non HMO)

## 2016-08-16 ENCOUNTER — Other Ambulatory Visit (HOSPITAL_COMMUNITY): Payer: Managed Care, Other (non HMO)

## 2016-08-17 ENCOUNTER — Other Ambulatory Visit (HOSPITAL_COMMUNITY): Payer: Managed Care, Other (non HMO)

## 2016-08-18 ENCOUNTER — Other Ambulatory Visit (HOSPITAL_COMMUNITY): Payer: Managed Care, Other (non HMO)

## 2016-08-21 ENCOUNTER — Other Ambulatory Visit (HOSPITAL_COMMUNITY): Payer: Managed Care, Other (non HMO)

## 2016-08-22 ENCOUNTER — Other Ambulatory Visit (HOSPITAL_COMMUNITY): Payer: Managed Care, Other (non HMO)

## 2016-08-23 ENCOUNTER — Other Ambulatory Visit (HOSPITAL_COMMUNITY): Payer: Managed Care, Other (non HMO)

## 2016-08-24 ENCOUNTER — Other Ambulatory Visit (HOSPITAL_COMMUNITY): Payer: Managed Care, Other (non HMO)

## 2016-08-25 ENCOUNTER — Other Ambulatory Visit (HOSPITAL_COMMUNITY): Payer: Managed Care, Other (non HMO)

## 2016-08-28 ENCOUNTER — Other Ambulatory Visit (HOSPITAL_COMMUNITY): Payer: Managed Care, Other (non HMO)

## 2016-08-29 ENCOUNTER — Other Ambulatory Visit (HOSPITAL_COMMUNITY): Payer: Managed Care, Other (non HMO)

## 2016-08-30 ENCOUNTER — Other Ambulatory Visit (HOSPITAL_COMMUNITY): Payer: Managed Care, Other (non HMO)

## 2016-08-31 ENCOUNTER — Ambulatory Visit (HOSPITAL_COMMUNITY): Payer: Self-pay | Admitting: Psychiatry

## 2016-08-31 ENCOUNTER — Other Ambulatory Visit (HOSPITAL_COMMUNITY): Payer: Managed Care, Other (non HMO)

## 2016-09-01 ENCOUNTER — Other Ambulatory Visit (HOSPITAL_COMMUNITY): Payer: Managed Care, Other (non HMO)

## 2016-09-05 ENCOUNTER — Other Ambulatory Visit (HOSPITAL_COMMUNITY): Payer: Managed Care, Other (non HMO)

## 2016-09-06 ENCOUNTER — Other Ambulatory Visit (HOSPITAL_COMMUNITY): Payer: Managed Care, Other (non HMO)

## 2016-09-07 ENCOUNTER — Other Ambulatory Visit (HOSPITAL_COMMUNITY): Payer: Managed Care, Other (non HMO)

## 2016-09-08 ENCOUNTER — Other Ambulatory Visit (HOSPITAL_COMMUNITY): Payer: Managed Care, Other (non HMO)

## 2016-10-05 ENCOUNTER — Ambulatory Visit (HOSPITAL_COMMUNITY): Payer: Self-pay | Admitting: Psychiatry

## 2016-10-17 ENCOUNTER — Ambulatory Visit (HOSPITAL_COMMUNITY): Payer: Self-pay | Admitting: Psychiatry

## 2016-10-21 ENCOUNTER — Emergency Department (HOSPITAL_COMMUNITY): Payer: 59

## 2016-10-21 ENCOUNTER — Encounter (HOSPITAL_COMMUNITY): Payer: Self-pay | Admitting: Emergency Medicine

## 2016-10-21 ENCOUNTER — Observation Stay (HOSPITAL_COMMUNITY)
Admission: EM | Admit: 2016-10-21 | Discharge: 2016-10-23 | Disposition: A | Discharge status: Home / Self Care | Payer: 59 | Attending: Family Medicine | Admitting: Family Medicine

## 2016-10-21 DIAGNOSIS — I10 Essential (primary) hypertension: Secondary | ICD-10-CM | POA: Diagnosis present

## 2016-10-21 DIAGNOSIS — K529 Noninfective gastroenteritis and colitis, unspecified: Principal | ICD-10-CM | POA: Diagnosis present

## 2016-10-21 DIAGNOSIS — E785 Hyperlipidemia, unspecified: Secondary | ICD-10-CM | POA: Diagnosis present

## 2016-10-21 DIAGNOSIS — Z79899 Other long term (current) drug therapy: Secondary | ICD-10-CM | POA: Insufficient documentation

## 2016-10-21 DIAGNOSIS — F331 Major depressive disorder, recurrent, moderate: Secondary | ICD-10-CM | POA: Diagnosis present

## 2016-10-21 DIAGNOSIS — K625 Hemorrhage of anus and rectum: Secondary | ICD-10-CM | POA: Diagnosis present

## 2016-10-21 DIAGNOSIS — N63 Unspecified lump in unspecified breast: Secondary | ICD-10-CM | POA: Diagnosis present

## 2016-10-21 HISTORY — DX: Unspecified osteoarthritis, unspecified site: M19.90

## 2016-10-21 LAB — URINALYSIS, ROUTINE W REFLEX MICROSCOPIC
BACTERIA UA: NONE SEEN
BILIRUBIN URINE: NEGATIVE
Glucose, UA: NEGATIVE mg/dL
KETONES UR: NEGATIVE mg/dL
Nitrite: NEGATIVE
PROTEIN: NEGATIVE mg/dL
Specific Gravity, Urine: 1.01 (ref 1.005–1.030)
pH: 6 (ref 5.0–8.0)

## 2016-10-21 LAB — CBC
HEMATOCRIT: 38 % (ref 36.0–46.0)
Hemoglobin: 12.8 g/dL (ref 12.0–15.0)
MCH: 28.8 pg (ref 26.0–34.0)
MCHC: 33.7 g/dL (ref 30.0–36.0)
MCV: 85.4 fL (ref 78.0–100.0)
Platelets: 258 10*3/uL (ref 150–400)
RBC: 4.45 MIL/uL (ref 3.87–5.11)
RDW: 13.4 % (ref 11.5–15.5)
WBC: 10.7 10*3/uL — AB (ref 4.0–10.5)

## 2016-10-21 LAB — LIPASE, BLOOD: LIPASE: 13 U/L (ref 11–51)

## 2016-10-21 LAB — COMPREHENSIVE METABOLIC PANEL
ALT: 20 U/L (ref 14–54)
AST: 28 U/L (ref 15–41)
Albumin: 4 g/dL (ref 3.5–5.0)
Alkaline Phosphatase: 76 U/L (ref 38–126)
Anion gap: 6 (ref 5–15)
BUN: 6 mg/dL (ref 6–20)
CHLORIDE: 103 mmol/L (ref 101–111)
CO2: 29 mmol/L (ref 22–32)
Calcium: 9.1 mg/dL (ref 8.9–10.3)
Creatinine, Ser: 0.63 mg/dL (ref 0.44–1.00)
Glucose, Bld: 97 mg/dL (ref 65–99)
POTASSIUM: 3.4 mmol/L — AB (ref 3.5–5.1)
SODIUM: 138 mmol/L (ref 135–145)
Total Bilirubin: 0.7 mg/dL (ref 0.3–1.2)
Total Protein: 8.2 g/dL — ABNORMAL HIGH (ref 6.5–8.1)

## 2016-10-21 LAB — POC OCCULT BLOOD, ED: FECAL OCCULT BLD: POSITIVE — AB

## 2016-10-21 MED ORDER — IOPAMIDOL (ISOVUE-300) INJECTION 61%
INTRAVENOUS | Status: AC
  Filled 2016-10-21: qty 100

## 2016-10-21 MED ORDER — SODIUM CHLORIDE 0.9 % IV SOLN
Freq: Once | INTRAVENOUS | Status: AC
  Administered 2016-10-22: 01:00:00 via INTRAVENOUS

## 2016-10-21 MED ORDER — ONDANSETRON HCL 4 MG/2ML IJ SOLN
4.0000 mg | Freq: Once | INTRAMUSCULAR | Status: AC
  Administered 2016-10-22: 4 mg via INTRAVENOUS
  Filled 2016-10-21: qty 2

## 2016-10-21 MED ORDER — IOPAMIDOL (ISOVUE-300) INJECTION 61%
100.0000 mL | Freq: Once | INTRAVENOUS | Status: AC | PRN
  Administered 2016-10-21: 100 mL via INTRAVENOUS

## 2016-10-21 MED ORDER — FENTANYL CITRATE (PF) 100 MCG/2ML IJ SOLN
50.0000 ug | Freq: Once | INTRAMUSCULAR | Status: AC
  Administered 2016-10-22: 50 ug via INTRAVENOUS
  Filled 2016-10-21: qty 2

## 2016-10-21 NOTE — ED Triage Notes (Signed)
Pt states she went to a pot luck supper yesterday and last night around 8pm she started having vomiting and then started having frequent stools  Pt states today she noticed some blood in her urine  Pt is c/o abd pain   Pt states she has not had a stool since this morning

## 2016-10-21 NOTE — ED Provider Notes (Signed)
[facility] DEPT Provider Note: Lowella Dell, MD, FACEP  CSN: [id number] MRN: [id number] ARRIVAL: 10/21/16 at 1941 ROOM: WA10/WA10   CHIEF COMPLAINT  Rectal Bleeding   HISTORY OF PRESENT ILLNESS  Ruth Brown is a 61 y.o. female who to follow-up nausea and vomiting yesterday evening. This was accompanied by increased stool output. The stools were initially formed and of normal color. They became looser and grossly bloody throughout the next approximately 12 hours. Her last bowel movement was about 11 AM today. The vomiting has ceased as well but she continues to be nauseated. She is having associated lower abdominal pain which persists. She rates her pain as an 8 out of 10 and is crampy in nature. It varies with position. She has not had a fever. She states her urine was red earlier today but is now back to normal color. She denies any recent antibiotics.   Past Medical History:  Diagnosis Date  . Anxiety   . Depression   . HTN (hypertension)   . Hyperlipidemia     Past Surgical History:  Procedure Laterality Date  . CESAREAN SECTION      Family History  Problem Relation Age of Onset  . Depression Mother   . Anxiety disorder Mother   . Alcohol abuse Mother   . Depression Maternal Aunt   . Anxiety disorder Maternal Aunt     Social History  Substance Use Topics  . Smoking status: Never Smoker  . Smokeless tobacco: Never Used  . Alcohol use No    Prior to Admission medications   Medication Sig Start Date End Date Taking? Authorizing Provider  citalopram (CELEXA) 20 MG tablet Take 1 tablet (20 mg total) by mouth daily. 07/06/16   Arfeen, Phillips Grout, MD  lisinopril-hydrochlorothiazide (PRINZIDE,ZESTORETIC) 10-12.5 MG per tablet Take 1 tablet by mouth daily.    [provider]  mirtazapine (REMERON) 30 MG tablet Take 1 tablet (30 mg total) by mouth at bedtime. 07/06/16 07/06/17  Arfeen, Phillips Grout, MD  simvastatin (ZOCOR) 20 MG tablet Take 20 mg by mouth every other  day.     [provider]  VIMOVO 500-20 MG TBEC Take 500 mg by mouth daily. 11/22/15   [provider]  vitamin B-12 (CYANOCOBALAMIN) 100 MCG tablet Take 100 mcg by mouth daily.    [provider]    Allergies Patient has no known allergies.   REVIEW OF SYSTEMS  Negative except as noted here or in the History of Present Illness.   PHYSICAL EXAMINATION  Initial Vital Signs Blood pressure 119/74, pulse (!) 103, temperature 98.3 F (36.8 C), temperature source Oral, resp. rate 16, SpO2 97 %.  Examination General: Well-developed, well-nourished female in no acute distress; appearance consistent with age of record HENT: normocephalic; atraumatic Eyes: pupils equal, round and reactive to light; extraocular muscles intact Neck: supple Heart: regular rate and rhythm Lungs: clear to auscultation bilaterally Abdomen: soft; nondistended; lower abdominal tenderness; no masses or hepatosplenomegaly; bowel sounds present Rectal: Normal sphincter tone; no formed stool in vault; gross blood on examining glove Extremities: No deformity; full range of motion; pulses normal Neurologic: Awake, alert and oriented; motor function intact in all extremities and symmetric; no facial droop Skin: Warm and dry Psychiatric: Normal mood and affect   RESULTS  Summary of this visit's results, reviewed by myself:   EKG Interpretation  Date/Time:    Ventricular Rate:    PR Interval:    QRS Duration:   QT Interval:  QTC Calculation:   R Axis:     Text Interpretation:        Laboratory Studies: Results for orders placed or performed during the hospital encounter of 10/21/16 (from the past 24 hour(s))  Urinalysis, Routine w reflex microscopic     Status: Abnormal   Collection Time: 10/21/16  8:11 PM  Result Value Ref Range   Color, Urine YELLOW YELLOW   APPearance HAZY (A) CLEAR   Specific Gravity, Urine 1.010 1.005 - 1.030   pH 6.0 5.0 - 8.0   Glucose, UA NEGATIVE  NEGATIVE mg/dL   Hgb urine dipstick MODERATE (A) NEGATIVE   Bilirubin Urine NEGATIVE NEGATIVE   Ketones, ur NEGATIVE NEGATIVE mg/dL   Protein, ur NEGATIVE NEGATIVE mg/dL   Nitrite NEGATIVE NEGATIVE   Leukocytes, UA MODERATE (A) NEGATIVE   RBC / HPF 0-5 0 - 5 RBC/hpf   WBC, UA 0-5 0 - 5 WBC/hpf   Bacteria, UA NONE SEEN NONE SEEN   Squamous Epithelial / LPF 6-30 (A) NONE SEEN   Mucous PRESENT    Budding Yeast PRESENT   Lipase, blood     Status: None   Collection Time: 10/21/16  9:43 PM  Result Value Ref Range   Lipase 13 11 - 51 U/L  Comprehensive metabolic panel     Status: Abnormal   Collection Time: 10/21/16  9:43 PM  Result Value Ref Range   Sodium 138 135 - 145 mmol/L   Potassium 3.4 (L) 3.5 - 5.1 mmol/L   Chloride 103 101 - 111 mmol/L   CO2 29 22 - 32 mmol/L   Glucose, Bld 97 65 - 99 mg/dL   BUN 6 6 - 20 mg/dL   Creatinine, Ser 1.610.63 0.44 - 1.00 mg/dL   Calcium 9.1 8.9 - 09.610.3 mg/dL   Total Protein 8.2 (H) 6.5 - 8.1 g/dL   Albumin 4.0 3.5 - 5.0 g/dL   AST 28 15 - 41 U/L   ALT 20 14 - 54 U/L   Alkaline Phosphatase 76 38 - 126 U/L   Total Bilirubin 0.7 0.3 - 1.2 mg/dL   GFR calc non Af Amer >60 >60 mL/min   GFR calc Af Amer >60 >60 mL/min   Anion gap 6 5 - 15  CBC     Status: Abnormal   Collection Time: 10/21/16  9:43 PM  Result Value Ref Range   WBC 10.7 (H) 4.0 - 10.5 K/uL   RBC 4.45 3.87 - 5.11 MIL/uL   Hemoglobin 12.8 12.0 - 15.0 g/dL   HCT 04.538.0 40.936.0 - 81.146.0 %   MCV 85.4 78.0 - 100.0 fL   MCH 28.8 26.0 - 34.0 pg   MCHC 33.7 30.0 - 36.0 g/dL   RDW 91.413.4 78.211.5 - 95.615.5 %   Platelets 258 150 - 400 K/uL  POC occult blood, ED     Status: Abnormal   Collection Time: 10/21/16 11:25 PM  Result Value Ref Range   Fecal Occult Bld POSITIVE (A) NEGATIVE   Imaging Studies: Ct Abdomen Pelvis W Contrast  Result Date: 10/22/2016 CLINICAL DATA:  Lower abdominal pain and rectal bleeding. History of hypertension and hyperlipidemia. EXAM: CT ABDOMEN AND PELVIS WITH CONTRAST  TECHNIQUE: Multidetector CT imaging of the abdomen and pelvis was performed using the standard protocol following bolus administration of intravenous contrast. CONTRAST:  100mL ISOVUE-300 IOPAMIDOL (ISOVUE-300) INJECTION 61% COMPARISON:  None. FINDINGS: LOWER CHEST: Lung bases are clear. Included heart size is normal. No pericardial effusion. HEPATOBILIARY: Numerous tiny layering gallstones without CT  findings of acute cholecystitis. Normal CT liver. PANCREAS: Normal. SPLEEN: Normal. ADRENALS/URINARY TRACT: Kidneys are orthotopic, demonstrating symmetric enhancement. No nephrolithiasis, hydronephrosis or solid renal masses. Too small to characterize hypodensity lower pole RIGHT kidney. The unopacified ureters are normal in course and caliber. Delayed imaging through the kidneys demonstrates symmetric prompt contrast excretion within the proximal urinary collecting system. Urinary bladder is partially distended and unremarkable. Normal adrenal glands. STOMACH/BOWEL: Transverse and descending colon circumferential wall thickening and edema with pericolonic fat stranding. Air-fluid level in the distal colon. The stomach, small bowel are normal in course and caliber without inflammatory changes. Normal appendix. VASCULAR/LYMPHATIC: Aortoiliac vessels are normal in course and caliber. No lymphadenopathy by CT size criteria. REPRODUCTIVE: Normal. OTHER: No intraperitoneal free fluid or free air. 2.5 x 4.8 cm focal glandular LEFT breast tissue versus mass partially imaged. MUSCULOSKELETAL: Nonacute.  Small fat containing umbilical hernia. IMPRESSION: 1. Acute colitis involving the transverse and descending colon without complication or obstruction. 2. Cholelithiasis without acute cholecystitis. 3. 2.5 x 4.8 cm focal glandular LEFT breast tissue versus mass. Recommend mammogram on a nonemergent basis an ultrasonic clinically indicated. Electronically Signed   By: Awilda Metro M.D.   On: 10/22/2016 00:07    ED COURSE   Nursing notes and initial vitals signs, including pulse oximetry, reviewed.  Vitals:   10/21/16 1947 10/21/16 2204  BP: (!) 123/91 119/74  Pulse: (!) 105 (!) 103  Resp: 18 16  Temp: 98.3 F (36.8 C)   TempSrc: Oral   SpO2: 99% 97%   12:45 AM After discussing with Dr. Ophelia Charter will start Cipro and Flagyl for possible infectious etiology.  PROCEDURES    ED DIAGNOSES     ICD-10-CM   1. Colitis K52.9        Lonisha Bobby, MD 10/22/16 [phone]

## 2016-10-22 ENCOUNTER — Encounter (HOSPITAL_COMMUNITY): Payer: Self-pay | Admitting: Internal Medicine

## 2016-10-22 DIAGNOSIS — N63 Unspecified lump in unspecified breast: Secondary | ICD-10-CM | POA: Diagnosis present

## 2016-10-22 DIAGNOSIS — K529 Noninfective gastroenteritis and colitis, unspecified: Secondary | ICD-10-CM | POA: Diagnosis present

## 2016-10-22 DIAGNOSIS — I10 Essential (primary) hypertension: Secondary | ICD-10-CM | POA: Diagnosis present

## 2016-10-22 LAB — CBC
HEMATOCRIT: 34.8 % — AB (ref 36.0–46.0)
Hemoglobin: 11.4 g/dL — ABNORMAL LOW (ref 12.0–15.0)
MCH: 28.3 pg (ref 26.0–34.0)
MCHC: 32.8 g/dL (ref 30.0–36.0)
MCV: 86.4 fL (ref 78.0–100.0)
PLATELETS: 237 10*3/uL (ref 150–400)
RBC: 4.03 MIL/uL (ref 3.87–5.11)
RDW: 13.5 % (ref 11.5–15.5)
WBC: 10.6 10*3/uL — AB (ref 4.0–10.5)

## 2016-10-22 LAB — BASIC METABOLIC PANEL
Anion gap: 9 (ref 5–15)
BUN: 5 mg/dL — AB (ref 6–20)
CHLORIDE: 103 mmol/L (ref 101–111)
CO2: 27 mmol/L (ref 22–32)
CREATININE: 0.61 mg/dL (ref 0.44–1.00)
Calcium: 8.9 mg/dL (ref 8.9–10.3)
GFR calc Af Amer: >60 mL/min (ref >60)
GFR calc non Af Amer: >60 mL/min (ref >60)
GLUCOSE: 125 mg/dL — AB (ref 65–99)
POTASSIUM: 3 mmol/L — AB (ref 3.5–5.1)
SODIUM: 139 mmol/L (ref 135–145)

## 2016-10-22 LAB — C DIFFICILE QUICK SCREEN W PCR REFLEX
C DIFFICILE (CDIFF) TOXIN: NEGATIVE
C Diff antigen: NEGATIVE
C Diff interpretation: NOT DETECTED

## 2016-10-22 LAB — GASTROINTESTINAL PANEL BY PCR, STOOL (REPLACES STOOL CULTURE)
Adenovirus F40/41: NOT DETECTED
Astrovirus: NOT DETECTED
CRYPTOSPORIDIUM: NOT DETECTED
Campylobacter species: NOT DETECTED
Cyclospora cayetanensis: NOT DETECTED
ENTAMOEBA HISTOLYTICA: NOT DETECTED
ENTEROAGGREGATIVE E COLI (EAEC): NOT DETECTED
Enteropathogenic E coli (EPEC): NOT DETECTED
Enterotoxigenic E coli (ETEC): NOT DETECTED
GIARDIA LAMBLIA: NOT DETECTED
Norovirus GI/GII: NOT DETECTED
Plesimonas shigelloides: NOT DETECTED
Rotavirus A: NOT DETECTED
SALMONELLA SPECIES: NOT DETECTED
SAPOVIRUS (I, II, IV, AND V): NOT DETECTED
SHIGELLA/ENTEROINVASIVE E COLI (EIEC): NOT DETECTED
Shiga like toxin producing E coli (STEC): NOT DETECTED
VIBRIO CHOLERAE: NOT DETECTED
Vibrio species: NOT DETECTED
YERSINIA ENTEROCOLITICA: NOT DETECTED

## 2016-10-22 LAB — I-STAT CG4 LACTIC ACID, ED: LACTIC ACID, VENOUS: 0.72 mmol/L (ref 0.5–1.9)

## 2016-10-22 MED ORDER — MIRTAZAPINE 30 MG PO TABS
30.0000 mg | ORAL_TABLET | Freq: Every evening | ORAL | Status: DC | PRN
  Administered 2016-10-22: 30 mg via ORAL
  Filled 2016-10-22: qty 1

## 2016-10-22 MED ORDER — METRONIDAZOLE IN NACL 5-0.79 MG/ML-% IV SOLN
500.0000 mg | Freq: Once | INTRAVENOUS | Status: AC
  Administered 2016-10-22: 500 mg via INTRAVENOUS
  Filled 2016-10-22: qty 100

## 2016-10-22 MED ORDER — ONDANSETRON HCL 4 MG/2ML IJ SOLN: 4.0000 mg | Freq: Four times a day (QID) | INTRAMUSCULAR | Status: DC | PRN

## 2016-10-22 MED ORDER — MORPHINE SULFATE (PF) 2 MG/ML IV SOLN: 2.0000 mg | INTRAVENOUS | Status: DC | PRN

## 2016-10-22 MED ORDER — METRONIDAZOLE IN NACL 5-0.79 MG/ML-% IV SOLN
500.0000 mg | Freq: Three times a day (TID) | INTRAVENOUS | Status: DC
  Administered 2016-10-22 – 2016-10-23 (×4): 500 mg via INTRAVENOUS
  Filled 2016-10-22 (×5): qty 100

## 2016-10-22 MED ORDER — HYDROCHLOROTHIAZIDE 12.5 MG PO CAPS
12.5000 mg | ORAL_CAPSULE | Freq: Every day | ORAL | Status: DC
  Administered 2016-10-22 – 2016-10-23 (×2): 12.5 mg via ORAL
  Filled 2016-10-22 (×2): qty 1

## 2016-10-22 MED ORDER — ACETAMINOPHEN 325 MG PO TABS: 650.0000 mg | ORAL_TABLET | Freq: Four times a day (QID) | ORAL | Status: DC | PRN

## 2016-10-22 MED ORDER — LISINOPRIL 10 MG PO TABS
10.0000 mg | ORAL_TABLET | Freq: Every day | ORAL | Status: DC
  Administered 2016-10-22 – 2016-10-23 (×2): 10 mg via ORAL
  Filled 2016-10-22 (×2): qty 1

## 2016-10-22 MED ORDER — CIPROFLOXACIN IN D5W 400 MG/200ML IV SOLN
400.0000 mg | Freq: Once | INTRAVENOUS | Status: AC
  Administered 2016-10-22: 400 mg via INTRAVENOUS
  Filled 2016-10-22: qty 200

## 2016-10-22 MED ORDER — CIPROFLOXACIN IN D5W 400 MG/200ML IV SOLN
400.0000 mg | Freq: Two times a day (BID) | INTRAVENOUS | Status: DC
  Administered 2016-10-22 – 2016-10-23 (×3): 400 mg via INTRAVENOUS
  Filled 2016-10-22 (×3): qty 200

## 2016-10-22 MED ORDER — ONDANSETRON HCL 4 MG PO TABS
4.0000 mg | ORAL_TABLET | Freq: Four times a day (QID) | ORAL | Status: DC | PRN
  Filled 2016-10-22: qty 1

## 2016-10-22 MED ORDER — POTASSIUM CHLORIDE IN NACL 20-0.9 MEQ/L-% IV SOLN
INTRAVENOUS | Status: DC
  Administered 2016-10-22: 02:00:00 via INTRAVENOUS
  Filled 2016-10-22 (×2): qty 1000

## 2016-10-22 MED ORDER — SIMVASTATIN 20 MG PO TABS
20.0000 mg | ORAL_TABLET | ORAL | Status: DC
  Administered 2016-10-22: 20 mg via ORAL
  Filled 2016-10-22: qty 1

## 2016-10-22 MED ORDER — LISINOPRIL-HYDROCHLOROTHIAZIDE 10-12.5 MG PO TABS: 1.0000 | ORAL_TABLET | Freq: Every day | ORAL | Status: DC

## 2016-10-22 MED ORDER — ACETAMINOPHEN 650 MG RE SUPP: 650.0000 mg | Freq: Four times a day (QID) | RECTAL | Status: DC | PRN

## 2016-10-22 MED ORDER — CITALOPRAM HYDROBROMIDE 20 MG PO TABS
20.0000 mg | ORAL_TABLET | Freq: Every day | ORAL | Status: DC
  Administered 2016-10-22 – 2016-10-23 (×2): 20 mg via ORAL
  Filled 2016-10-22 (×2): qty 1

## 2016-10-22 MED ORDER — POTASSIUM CHLORIDE 10 MEQ/100ML IV SOLN
10.0000 meq | INTRAVENOUS | Status: AC
  Administered 2016-10-22 (×4): 10 meq via INTRAVENOUS
  Filled 2016-10-22 (×4): qty 100

## 2016-10-22 NOTE — Progress Notes (Signed)
[facility]   Patient admitted after midnight see H&P for further details  Patient is a 61 year old female with medical history significant for hypertension, depression and anxiety presented to the emergency department complaining of nausea, vomiting, diarrhea with hematemesis. Patient reported symptoms started after having a blood at her work which subsequently resulted in vomiting and diarrhea. Upon evaluation CT of the abdomen showed colitis for which she was started on Cipro and Flagyl.   Chart was reviewed, patient stable   Plan Continue IV antibiotics for now  Advance diet as tolerated The patient remains stable consistent oral antibiotic and discharged home in the morning.   Latrelle DodrillEdwin Silva, MD

## 2016-10-22 NOTE — H&P (Signed)
History and Physical    Ruth Brown ZOX:[id number] DOB: Jan 22, 1956 DOA: 10/21/2016  PCP: Delbert Harness Consultants:  Robert Bellow - psychiatry Patient coming from: Home - lives with husband and son; NOK: husband, [phone]  Chief Complaint: hematochezia  HPI: Ruth Brown is a 61 y.o. female with medical history significant of HTN, HLD, depression/anxiety, and arthritis presenting with n/v/d with hematochezia.  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  She felt fine prior to and during the potluck Friday.  She got home and started feeling bad - felt nauseated and vomited everything she had eaten.  Then had diarrhea and vomiting simultaneously.  Then she noticed some blood with wiping but it stopped.  This AM, she had severe abdominal pain.  She was wearing a pad and the pad was full of blood from her bottom.  Had more stools - all blood.  Continued to feel bad all day and kept having bloody stools.  Felt warm but did not check temperature.  Was able to keep down ginger ale and crackers today.  Someone at work had complained of feeling flu-like prior to the party.  Last colonoscopy was in 2011 and was normal.   ED Course: Cipro/Flagyl for colitis seen on CT, admit  Review of Systems: As per HPI; otherwise review of systems reviewed and negative.   Ambulatory Status:  Ambulates without assistance  Past Medical History:  Diagnosis Date  . Anxiety   . Arthritis   . Depression   . HTN (hypertension)   . Hyperlipidemia     Past Surgical History:  Procedure Laterality Date  . CESAREAN SECTION      Social History   Social History  . Marital status: Married    Spouse name: N/A  . Number of children: N/A  . Years of education: N/A   Occupational History  . mortgage collections    Social History Main Topics  . Smoking status: Never Smoker  . Smokeless tobacco: Never Used  . Alcohol use No  . Drug use: No    . Sexual activity: Yes   Other Topics Concern  . Not on file   Social History Narrative  . No narrative on file    No Known Allergies  Family History  Problem Relation Age of Onset  . Depression Mother   . Anxiety disorder Mother   . Alcohol abuse Mother   . Depression Maternal Aunt   . Anxiety disorder Maternal Aunt     Prior to Admission medications   Medication Sig Start Date End Date Taking? Authorizing Provider  citalopram (CELEXA) 20 MG tablet Take 1 tablet (20 mg total) by mouth daily. 07/06/16  Yes Arfeen, Phillips Grout, MD  lisinopril-hydrochlorothiazide (PRINZIDE,ZESTORETIC) 10-12.5 MG per tablet Take 1 tablet by mouth daily.   Yes [provider]  mirtazapine (REMERON) 30 MG tablet Take 1 tablet (30 mg total) by mouth at bedtime. Patient taking differently: Take 30 mg by mouth at bedtime as needed (sleep).  07/06/16 07/06/17 Yes Arfeen, Phillips Grout, MD  naproxen (NAPROSYN) 500 MG tablet Take 500 mg by mouth 2 (two) times daily as needed for mild pain or moderate pain.  07/31/16  Yes [provider]  simvastatin (ZOCOR) 20 MG tablet Take 20 mg by mouth every other day.    Yes [provider]  VIMOVO 500-20 MG TBEC Take 500 mg by mouth 2 (two) times daily as needed (pain).  11/22/15  Yes [provider]  vitamin B-12 (CYANOCOBALAMIN) 100 MCG tablet Take 100 mcg by mouth daily.   Yes [provider]    Physical Exam: Vitals:   10/21/16 1947 10/21/16 2204 10/22/16 0027  BP: (!) 123/91 119/74 124/68  Pulse: (!) 105 (!) 103 99  Resp: 18 16 16   Temp: 98.3 F (36.8 C)    TempSrc: Oral    SpO2: 99% 97% 96%     General:  Appears calm and comfortable and is NAD Eyes:  PERRL, EOMI, normal lids, iris ENT:  grossly normal hearing, lips & tongue, mmm Neck:  no LAD, masses or thyromegaly Cardiovascular:  RRR, no m/r/g. No LE edema.  Respiratory:  CTA bilaterally, no w/r/r. Normal respiratory effort. Abdomen:  soft, ntnd, NABS Skin:  no rash  or induration seen on limited exam Musculoskeletal:  grossly normal tone BUE/BLE, good ROM, no bony abnormality Psychiatric:  grossly normal mood and affect, speech fluent and appropriate, AOx3 Neurologic:  CN 2-12 grossly intact, moves all extremities in coordinated fashion, sensation intact  Labs on Admission: I have personally reviewed following labs and imaging studies  CBC:  Recent Labs Lab 10/21/16 2143  WBC 10.7*  HGB 12.8  HCT 38.0  MCV 85.4  PLT 258   Basic Metabolic Panel:  Recent Labs Lab 10/21/16 2143  NA 138  K 3.4*  CL 103  CO2 29  GLUCOSE 97  BUN 6  CREATININE 0.63  CALCIUM 9.1   GFR: CrCl cannot be calculated (Unknown ideal weight.). Liver Function Tests:  Recent Labs Lab 10/21/16 2143  AST 28  ALT 20  ALKPHOS 76  BILITOT 0.7  PROT 8.2*  ALBUMIN 4.0    Recent Labs Lab 10/21/16 2143  LIPASE 13   No results for input(s): AMMONIA in the last 168 hours. Coagulation Profile: No results for input(s): INR, PROTIME in the last 168 hours. Cardiac Enzymes: No results for input(s): CKTOTAL, CKMB, CKMBINDEX, TROPONINI in the last 168 hours. BNP (last 3 results) No results for input(s): PROBNP in the last 8760 hours. HbA1C: No results for input(s): HGBA1C in the last 72 hours. CBG: No results for input(s): GLUCAP in the last 168 hours. Lipid Profile: No results for input(s): CHOL, HDL, LDLCALC, TRIG, CHOLHDL, LDLDIRECT in the last 72 hours. Thyroid Function Tests: No results for input(s): TSH, T4TOTAL, FREET4, T3FREE, THYROIDAB in the last 72 hours. Anemia Panel: No results for input(s): VITAMINB12, FOLATE, FERRITIN, TIBC, IRON, RETICCTPCT in the last 72 hours. Urine analysis:    Component Value Date/Time   COLORURINE YELLOW 10/21/2016 2011   APPEARANCEUR HAZY (A) 10/21/2016 2011   LABSPEC 1.010 10/21/2016 2011   PHURINE 6.0 10/21/2016 2011   GLUCOSEU NEGATIVE 10/21/2016 2011   HGBUR MODERATE (A) 10/21/2016 2011   BILIRUBINUR NEGATIVE  10/21/2016 2011   KETONESUR NEGATIVE 10/21/2016 2011   PROTEINUR NEGATIVE 10/21/2016 2011   NITRITE NEGATIVE 10/21/2016 2011   LEUKOCYTESUR MODERATE (A) 10/21/2016 2011    Creatinine Clearance: CrCl cannot be calculated (Unknown ideal weight.).  Sepsis Labs: @LABRCNTIP (procalcitonin:4,lacticidven:4) )No results found for this or any previous visit (from the past 240 hour(s)).   Radiological Exams on Admission: Ct Abdomen Pelvis W Contrast  Result Date: 10/22/2016 CLINICAL DATA:  Lower abdominal pain and rectal bleeding. History of hypertension and hyperlipidemia. EXAM: CT ABDOMEN AND PELVIS WITH CONTRAST TECHNIQUE: Multidetector CT imaging of the abdomen and pelvis was performed using the standard protocol following bolus administration of intravenous contrast. CONTRAST:  ISOVUE-300 IOPAMIDOL (ISOVUE-300) INJECTION  61% COMPARISON:  None. FINDINGS: LOWER CHEST: Lung bases are clear. Included heart size is normal. No pericardial effusion. HEPATOBILIARY: Numerous tiny layering gallstones without CT findings of acute cholecystitis. Normal CT liver. PANCREAS: Normal. SPLEEN: Normal. ADRENALS/URINARY TRACT: Kidneys are orthotopic, demonstrating symmetric enhancement. No nephrolithiasis, hydronephrosis or solid renal masses. Too small to characterize hypodensity lower pole RIGHT kidney. The unopacified ureters are normal in course and caliber. Delayed imaging through the kidneys demonstrates symmetric prompt contrast excretion within the proximal urinary collecting system. Urinary bladder is partially distended and unremarkable. Normal adrenal glands. STOMACH/BOWEL: Transverse and descending colon circumferential wall thickening and edema with pericolonic fat stranding. Air-fluid level in the distal colon. The stomach, small bowel are normal in course and caliber without inflammatory changes. Normal appendix. VASCULAR/LYMPHATIC: Aortoiliac vessels are normal in course and caliber. No lymphadenopathy  by CT size criteria. REPRODUCTIVE: Normal. OTHER: No intraperitoneal free fluid or free air. 2.5 x 4.8 cm focal glandular LEFT breast tissue versus mass partially imaged. MUSCULOSKELETAL: Nonacute.  Small fat containing umbilical hernia. IMPRESSION: 1. Acute colitis involving the transverse and descending colon without complication or obstruction. 2. Cholelithiasis without acute cholecystitis. 3. 2.5 x 4.8 cm focal glandular LEFT breast tissue versus mass. Recommend mammogram on a nonemergent basis an ultrasonic clinically indicated. Electronically Signed   By: Awilda Metro M.D.   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 -Inpatient Observation  -Repeat CBC in AM -Continue to monitor for recurrent bleeding  -C diff and GI panel ordered -Enteric precautions for now -blood culture if patient becomes febrile -She is not due for repeat colonoscopy until 2021; if no further issues, it may be reasonable to return to routine screening without GI f/u at this time. -Of note, she does take both Naprosyn and Vimovo (naprosyn with esomeprazole); while NSAIDs are unlikely to cause hematochezia (as opposed to  melena from an UGI bleed), it may be reasonable to limit her NSAID use.  Possible breast mass -Incidental finding on CT of possible breast mass -Needs outpatient mammogram -I did not ask the patient about her last mammogram at the time of our visit  HTN -Continue Lisinopril/HCTZ given normal renal function and BP -Will follow BP and check BMP in AM  HLD -Continue Zocor  Depression -Continue Celexa and Remeron  DVT prophylaxis:  SCDs Code Status:  Full - confirmed with patient Family Communication: None present  Disposition Plan:  Home once clinically improved Consults called: None  Admission status: It is my clinical opinion that referral for OBSERVATION is reasonable and necessary in this patient based on the above information provided. The aforementioned taken together are felt to place the patient at high risk for further clinical deterioration. However it is anticipated that the patient may be medically stable for discharge from  the hospital within 24 to 48 hours.    Jonah BlueJennifer Quadre Bristol MD [facility]  If 7PM-7AM, please contact night-coverage [email or web address] Password [email or web address]  10/22/2016, 1:20 AM

## 2016-10-23 DIAGNOSIS — K529 Noninfective gastroenteritis and colitis, unspecified: Secondary | ICD-10-CM | POA: Diagnosis not present

## 2016-10-23 DIAGNOSIS — N63 Unspecified lump in unspecified breast: Secondary | ICD-10-CM

## 2016-10-23 DIAGNOSIS — I10 Essential (primary) hypertension: Secondary | ICD-10-CM

## 2016-10-23 DIAGNOSIS — F331 Major depressive disorder, recurrent, moderate: Secondary | ICD-10-CM | POA: Diagnosis not present

## 2016-10-23 LAB — CBC WITH DIFFERENTIAL/PLATELET
Basophils Absolute: 0 10*3/uL (ref 0.0–0.1)
Basophils Relative: 0 %
EOS ABS: 0.1 10*3/uL (ref 0.0–0.7)
EOS PCT: 2 %
HCT: 31.8 % — ABNORMAL LOW (ref 36.0–46.0)
HEMOGLOBIN: 10.5 g/dL — AB (ref 12.0–15.0)
LYMPHS ABS: 2.1 10*3/uL (ref 0.7–4.0)
Lymphocytes Relative: 28 %
MCH: 28.1 pg (ref 26.0–34.0)
MCHC: 33 g/dL (ref 30.0–36.0)
MCV: 85 fL (ref 78.0–100.0)
MONOS PCT: 5 %
Monocytes Absolute: 0.4 10*3/uL (ref 0.1–1.0)
NEUTROS PCT: 65 %
Neutro Abs: 5.1 10*3/uL (ref 1.7–7.7)
Platelets: 203 10*3/uL (ref 150–400)
RBC: 3.74 MIL/uL — ABNORMAL LOW (ref 3.87–5.11)
RDW: 13.4 % (ref 11.5–15.5)
WBC: 7.7 10*3/uL (ref 4.0–10.5)

## 2016-10-23 LAB — BASIC METABOLIC PANEL
Anion gap: 9 (ref 5–15)
CHLORIDE: 106 mmol/L (ref 101–111)
CO2: 25 mmol/L (ref 22–32)
CREATININE: 0.58 mg/dL (ref 0.44–1.00)
Calcium: 8.7 mg/dL — ABNORMAL LOW (ref 8.9–10.3)
GFR calc Af Amer: >60 mL/min (ref >60)
GFR calc non Af Amer: >60 mL/min (ref >60)
GLUCOSE: 131 mg/dL — AB (ref 65–99)
Potassium: 4.5 mmol/L (ref 3.5–5.1)
Sodium: 140 mmol/L (ref 135–145)

## 2016-10-23 LAB — HIV ANTIBODY (ROUTINE TESTING W REFLEX): HIV Screen 4th Generation wRfx: NONREACTIVE

## 2016-10-23 MED ORDER — METRONIDAZOLE 250 MG PO TABS: 250.0000 mg | ORAL_TABLET | Freq: Three times a day (TID) | ORAL | 0 refills | Status: AC

## 2016-10-23 MED ORDER — CIPROFLOXACIN HCL 500 MG PO TABS: 500.0000 mg | ORAL_TABLET | Freq: Two times a day (BID) | ORAL | 0 refills | Status: AC

## 2016-10-23 NOTE — Discharge Summary (Signed)
Physician Discharge Summary  Ruth Brown  ZOX:[id number]  DOB: May 12, 1955  DOA: 10/21/2016 PCP: Patient, No Pcp Per  Admit date: 10/21/2016 Discharge date: 10/23/2016  Admitted From: Home Disposition:  Home  Recommendations for Outpatient Follow-up:  1. Follow up with PCP in 1 week 2. Please obtain BMP/CBC in one week to monitor hemoglobin and renal function 3. Patient need outpatient mammogram to evaluate possible left breast mass 4.  Discharge Condition: Stable CODE STATUS: Full code  Diet recommendation: Heart Healthy soft   Brief/Interim Summary: Patient is a 61 year old female with medical history significant for hypertension, depression and anxiety presented to the emergency department complaining of nausea, vomiting, diarrhea with hematemesis. Patient reported symptoms started after having a blood at her work which subsequently resulted in vomiting and diarrhea. Upon evaluation CT of the abdomen showed colitis for which she was started on Cipro and Flagyl.   Subjective: Patient seen and examined on the day of discharge. She report her diarrhea resolved. Nonbloody stools. Tolerating diet well. She remains afebrile. Ambulating with no issues. Denies chest pain, shortness of breath, palpitation and dizziness  Discharge Diagnoses/Hospital Course:  Transverse and descending colon colitis - unclear etiology Patient was initially treated with IV Cipro and Flagyl, with significant improvement. We'll continue antibiotic therapy to complete 5 days. Diarrhea has resolved Diet was advanced as tolerated. Patient tolerated solid diet without issues no further diarrhea. GI panel and C. difficile were negative Follow-up with PCP  Possible left breast mass Last mammogram was on 2016, patient does not recall any abnormalities. She was advised to follow-up with PCP for outpatient mammogram  Hypertension BP stable during hospital stay No changes in medication for  make  Depression Celexa and Remeron were continued during hospital stay  On the day of the discharge the patient's vitals were stable, and no other acute medical condition were reported by patient. the patient was felt safe to be discharge to home  Discharge Instructions  You were cared for by a hospitalist during your hospital stay. If you have any questions about your discharge medications or the care you received while you were in the hospital after you are discharged, you can call the unit and asked to speak with the hospitalist on call if the hospitalist that took care of you is not available. Once you are discharged, your primary care physician will handle any further medical issues. Please note that NO REFILLS for any discharge medications will be authorized once you are discharged, as it is imperative that you return to your primary care physician (or establish a relationship with a primary care physician if you do not have one) for your aftercare needs so that they can reassess your need for medications and monitor your lab values.  Discharge Instructions    Call MD for:  difficulty breathing, headache or visual disturbances    Complete by:  As directed    Call MD for:  extreme fatigue    Complete by:  As directed    Call MD for:  hives    Complete by:  As directed    Call MD for:  persistant dizziness or light-headedness    Complete by:  As directed    Call MD for:  persistant nausea and vomiting    Complete by:  As directed    Call MD for:  redness, tenderness, or signs of infection (pain, swelling, redness, odor or green/yellow discharge around incision site)    Complete by:  As directed  Call MD for:  severe uncontrolled pain    Complete by:  As directed    Call MD for:  temperature >100.4    Complete by:  As directed    Diet - low sodium heart healthy    Complete by:  As directed    Increase activity slowly    Complete by:  As directed      Allergies as of 10/23/2016    No Known Allergies     Medication List    STOP taking these medications   naproxen 500 MG tablet Commonly known as:  NAPROSYN   VIMOVO 500-20 MG Tbec Generic drug:  Naproxen-Esomeprazole     TAKE these medications   ciprofloxacin 500 MG tablet Commonly known as:  CIPRO Take 1 tablet (500 mg total) by mouth 2 (two) times daily.   citalopram 20 MG tablet Commonly known as:  CELEXA Take 1 tablet (20 mg total) by mouth daily.   lisinopril-hydrochlorothiazide 10-12.5 MG tablet Commonly known as:  PRINZIDE,ZESTORETIC Take 1 tablet by mouth daily.   metroNIDAZOLE 250 MG tablet Commonly known as:  FLAGYL Take 1 tablet (250 mg total) by mouth 3 (three) times daily.   mirtazapine 30 MG tablet Commonly known as:  REMERON Take 1 tablet (30 mg total) by mouth at bedtime. What changed:  when to take this  reasons to take this   simvastatin 20 MG tablet Commonly known as:  ZOCOR Take 20 mg by mouth every other day.   vitamin B-12 100 MCG tablet Commonly known as:  CYANOCOBALAMIN Take 100 mcg by mouth daily.      Follow-up Information    PCP. Schedule an appointment as soon as possible for a visit in 1 week(s).   Why:  Hospital Follow up          No Known Allergies  Consultations:  None  Procedures/Studies: Ct Abdomen Pelvis W Contrast  Result Date: 10/22/2016 CLINICAL DATA:  Lower abdominal pain and rectal bleeding. History of hypertension and hyperlipidemia. EXAM: CT ABDOMEN AND PELVIS WITH CONTRAST TECHNIQUE: Multidetector CT imaging of the abdomen and pelvis was performed using the standard protocol following bolus administration of intravenous contrast. CONTRAST:  100mL ISOVUE-300 IOPAMIDOL (ISOVUE-300) INJECTION 61% COMPARISON:  None. FINDINGS: LOWER CHEST: Lung bases are clear. Included heart size is normal. No pericardial effusion. HEPATOBILIARY: Numerous tiny layering gallstones without CT findings of acute cholecystitis. Normal CT liver. PANCREAS: Normal.  SPLEEN: Normal. ADRENALS/URINARY TRACT: Kidneys are orthotopic, demonstrating symmetric enhancement. No nephrolithiasis, hydronephrosis or solid renal masses. Too small to characterize hypodensity lower pole RIGHT kidney. The unopacified ureters are normal in course and caliber. Delayed imaging through the kidneys demonstrates symmetric prompt contrast excretion within the proximal urinary collecting system. Urinary bladder is partially distended and unremarkable. Normal adrenal glands. STOMACH/BOWEL: Transverse and descending colon circumferential wall thickening and edema with pericolonic fat stranding. Air-fluid level in the distal colon. The stomach, small bowel are normal in course and caliber without inflammatory changes. Normal appendix. VASCULAR/LYMPHATIC: Aortoiliac vessels are normal in course and caliber. No lymphadenopathy by CT size criteria. REPRODUCTIVE: Normal. OTHER: No intraperitoneal free fluid or free air. 2.5 x 4.8 cm focal glandular LEFT breast tissue versus mass partially imaged. MUSCULOSKELETAL: Nonacute.  Small fat containing umbilical hernia. IMPRESSION: 1. Acute colitis involving the transverse and descending colon without complication or obstruction. 2. Cholelithiasis without acute cholecystitis. 3. 2.5 x 4.8 cm focal glandular LEFT breast tissue versus mass. Recommend mammogram on a nonemergent basis an ultrasonic clinically indicated. Electronically Signed  By: Awilda Metro M.D.   On: 10/22/2016 00:07    Discharge Exam: Vitals:   10/23/16 0438 10/23/16 1204  BP: 101/72 (!) 86/56  Pulse: (!) 102   Resp: 18 16  Temp: 98.2 F (36.8 C)    Vitals:   10/22/16 1445 10/22/16 2040 10/23/16 0438 10/23/16 1204  BP: 111/69 (!) 114/94 101/72 (!) 86/56  Pulse: 100 (!) 117 (!) 102   Resp: 19 20 18 16   Temp: 99.7 F (37.6 C) 98.6 F (37 C) 98.2 F (36.8 C)   TempSrc: Oral Oral Oral   SpO2: 100% 98% 97%   Weight:      Height:        General: Pt is alert, awake, not in  acute distress Cardiovascular: RRR, S1/S2 +, no rubs, no gallops Respiratory: CTA bilaterally, no wheezing, no rhonchi Abdominal: Soft, NT, ND, bowel sounds + Extremities: no edema, no cyanosis   The results of significant diagnostics from this hospitalization (including imaging, microbiology, ancillary and laboratory) are listed below for reference.     Microbiology: Recent Results (from the past 240 hour(s))  Gastrointestinal Panel by PCR , Stool     Status: None   Collection Time: 10/22/16 12:33 AM  Result Value Ref Range Status   Campylobacter species NOT DETECTED NOT DETECTED Final   Plesimonas shigelloides NOT DETECTED NOT DETECTED Final   Salmonella species NOT DETECTED NOT DETECTED Final   Yersinia enterocolitica NOT DETECTED NOT DETECTED Final   Vibrio species NOT DETECTED NOT DETECTED Final   Vibrio cholerae NOT DETECTED NOT DETECTED Final   Enteroaggregative E coli (EAEC) NOT DETECTED NOT DETECTED Final   Enteropathogenic E coli (EPEC) NOT DETECTED NOT DETECTED Final   Enterotoxigenic E coli (ETEC) NOT DETECTED NOT DETECTED Final   Shiga like toxin producing E coli (STEC) NOT DETECTED NOT DETECTED Final   Shigella/Enteroinvasive E coli (EIEC) NOT DETECTED NOT DETECTED Final   Cryptosporidium NOT DETECTED NOT DETECTED Final   Cyclospora cayetanensis NOT DETECTED NOT DETECTED Final   Entamoeba histolytica NOT DETECTED NOT DETECTED Final   Giardia lamblia NOT DETECTED NOT DETECTED Final   Adenovirus F40/41 NOT DETECTED NOT DETECTED Final   Astrovirus NOT DETECTED NOT DETECTED Final   Norovirus GI/GII NOT DETECTED NOT DETECTED Final   Rotavirus A NOT DETECTED NOT DETECTED Final   Sapovirus (I, II, IV, and V) NOT DETECTED NOT DETECTED Final  C difficile quick scan w PCR reflex     Status: None   Collection Time: 10/22/16 12:33 AM  Result Value Ref Range Status   C Diff antigen NEGATIVE NEGATIVE Final   C Diff toxin NEGATIVE NEGATIVE Final   C Diff interpretation No C.  difficile detected.  Final     Labs: BNP (last 3 results) No results for input(s): BNP in the last 8760 hours. Basic Metabolic Panel:  Recent Labs Lab 10/21/16 2143 10/22/16 0412 10/23/16 0406  NA 138 139 140  K 3.4* 3.0* 4.5  CL 103 103 106  CO2 29 27 25   GLUCOSE 97 125* 131*  BUN 6 5* <5*  CREATININE 0.63 0.61 0.58  CALCIUM 9.1 8.9 8.7*   Liver Function Tests:  Recent Labs Lab 10/21/16 2143  AST 28  ALT 20  ALKPHOS 76  BILITOT 0.7  PROT 8.2*  ALBUMIN 4.0    Recent Labs Lab 10/21/16 2143  LIPASE 13   CBC:  Recent Labs Lab 10/21/16 2143 10/22/16 0412 10/23/16 0700  WBC 10.7* 10.6* 7.7  NEUTROABS  --   --  5.1  HGB 12.8 11.4* 10.5*  HCT 38.0 34.8* 31.8*  MCV 85.4 86.4 85.0  PLT 258 237 203   Anemia work up No results for input(s): VITAMINB12, FOLATE, FERRITIN, TIBC, IRON, RETICCTPCT in the last 72 hours. Urinalysis    Component Value Date/Time   COLORURINE YELLOW 10/21/2016 2011   APPEARANCEUR HAZY (A) 10/21/2016 2011   LABSPEC 1.010 10/21/2016 2011   PHURINE 6.0 10/21/2016 2011   GLUCOSEU NEGATIVE 10/21/2016 2011   HGBUR MODERATE (A) 10/21/2016 2011   BILIRUBINUR NEGATIVE 10/21/2016 2011   KETONESUR NEGATIVE 10/21/2016 2011   PROTEINUR NEGATIVE 10/21/2016 2011   NITRITE NEGATIVE 10/21/2016 2011   LEUKOCYTESUR MODERATE (A) 10/21/2016 2011   Sepsis Labs Invalid input(s): PROCALCITONIN,  WBC,  LACTICIDVEN Microbiology Recent Results (from the past 240 hour(s))  Gastrointestinal Panel by PCR , Stool     Status: None   Collection Time: 10/22/16 12:33 AM  Result Value Ref Range Status   Campylobacter species NOT DETECTED NOT DETECTED Final   Plesimonas shigelloides NOT DETECTED NOT DETECTED Final   Salmonella species NOT DETECTED NOT DETECTED Final   Yersinia enterocolitica NOT DETECTED NOT DETECTED Final   Vibrio species NOT DETECTED NOT DETECTED Final   Vibrio cholerae NOT DETECTED NOT DETECTED Final   Enteroaggregative E coli (EAEC) NOT  DETECTED NOT DETECTED Final   Enteropathogenic E coli (EPEC) NOT DETECTED NOT DETECTED Final   Enterotoxigenic E coli (ETEC) NOT DETECTED NOT DETECTED Final   Shiga like toxin producing E coli (STEC) NOT DETECTED NOT DETECTED Final   Shigella/Enteroinvasive E coli (EIEC) NOT DETECTED NOT DETECTED Final   Cryptosporidium NOT DETECTED NOT DETECTED Final   Cyclospora cayetanensis NOT DETECTED NOT DETECTED Final   Entamoeba histolytica NOT DETECTED NOT DETECTED Final   Giardia lamblia NOT DETECTED NOT DETECTED Final   Adenovirus F40/41 NOT DETECTED NOT DETECTED Final   Astrovirus NOT DETECTED NOT DETECTED Final   Norovirus GI/GII NOT DETECTED NOT DETECTED Final   Rotavirus A NOT DETECTED NOT DETECTED Final   Sapovirus (I, II, IV, and V) NOT DETECTED NOT DETECTED Final  C difficile quick scan w PCR reflex     Status: None   Collection Time: 10/22/16 12:33 AM  Result Value Ref Range Status   C Diff antigen NEGATIVE NEGATIVE Final   C Diff toxin NEGATIVE NEGATIVE Final   C Diff interpretation No C. difficile detected.  Final    Time coordinating discharge: 25 minutes  SIGNED:  Latrelle Dodrill, MD  [facility] 10/23/2016, 2:00 PM  Pager please text page via  [email or web address] Password [email or web address]

## 2016-10-23 NOTE — Progress Notes (Signed)
Discharge instructions explained to pt ,scripts given to her for cipro and flagyl. States understanding instructions. Discharged via wheelchair, taking taxi home.

## 2017-05-10 ENCOUNTER — Ambulatory Visit (HOSPITAL_COMMUNITY): Payer: Self-pay | Admitting: Psychiatry

## 2017-06-13 ENCOUNTER — Ambulatory Visit (HOSPITAL_COMMUNITY): Payer: Self-pay | Admitting: Psychiatry

## 2017-07-11 ENCOUNTER — Ambulatory Visit (HOSPITAL_COMMUNITY): Payer: Self-pay | Admitting: Psychiatry

## 2017-07-11 ENCOUNTER — Encounter (HOSPITAL_COMMUNITY): Payer: Self-pay

## 2018-05-29 ENCOUNTER — Ambulatory Visit (HOSPITAL_COMMUNITY)
Admission: EM | Admit: 2018-05-29 | Discharge: 2018-05-29 | Disposition: A | Discharge status: Home / Self Care | Payer: 59 | Attending: Family Medicine | Admitting: Family Medicine

## 2018-05-29 ENCOUNTER — Ambulatory Visit (INDEPENDENT_AMBULATORY_CARE_PROVIDER_SITE_OTHER): Payer: 59

## 2018-05-29 ENCOUNTER — Encounter (HOSPITAL_COMMUNITY): Payer: Self-pay | Admitting: Emergency Medicine

## 2018-05-29 DIAGNOSIS — R079 Chest pain, unspecified: Secondary | ICD-10-CM

## 2018-05-29 DIAGNOSIS — J22 Unspecified acute lower respiratory infection: Secondary | ICD-10-CM

## 2018-05-29 DIAGNOSIS — M940 Chondrocostal junction syndrome [Tietze]: Secondary | ICD-10-CM

## 2018-05-29 DIAGNOSIS — R0789 Other chest pain: Secondary | ICD-10-CM

## 2018-05-29 DIAGNOSIS — R0602 Shortness of breath: Secondary | ICD-10-CM | POA: Diagnosis not present

## 2018-05-29 MED ORDER — DOXYCYCLINE HYCLATE 100 MG PO CAPS: 100.0000 mg | ORAL_CAPSULE | Freq: Two times a day (BID) | ORAL | 0 refills | Status: DC

## 2018-05-29 MED ORDER — AMOXICILLIN 500 MG PO CAPS: 500.0000 mg | ORAL_CAPSULE | Freq: Two times a day (BID) | ORAL | 0 refills | Status: AC

## 2018-05-29 NOTE — ED Triage Notes (Signed)
Pt presents to [facility] for assessment of sore to under left side of tongue, shortness of breath with a lot of talking, and continued soft cough since her a viral illness 1 week ago

## 2018-05-29 NOTE — ED Provider Notes (Signed)
[facility]   [id number] 05/29/18 Arrival Time: 1405   CC: CHEST pressure and SOB  SUBJECTIVE:  Ruth Brown is a 63 y.o. female hx significant for HTN, HLD, depression, anxiety, OA, who presents with complaint of chest pressure and SOB that began 3 weeks.  Admits to recent URI symptoms with cough.  Was treated with z-pak by PCP with temporary improvement.  Localizes chest discomfort to lateral RT ribs.  Describes as worsening, constant and pressure/ sharp in character.  Rates pain as 9/10.   Has tried OTC cold medications without relief.  Symptoms made worse with laying on RT side and twisting ROM about the spine.  Reports radiating symptoms towards back.  Denies previous symptoms in the past. Complains of associated chills, and anxiety. Denies fever, chills, lightheadedness, dizziness, palpitations, tachycardia, nausea, vomiting, abdominal pain, changes in bowel or bladder habits, diaphoresis, numbness/tingling in extremities, peripheral edema.  Denies calf pain or swelling, recent long travel, recent surgery, pregnancy, malignancy, tobacco use, hormone use, or previous blood clot  Family hx significant for father with MI at age 76  Previous cardiac testing: electrocardiogram (ECG).  ROS: As per HPI. Past Medical History:  Diagnosis Date  . Anxiety   . Arthritis   . Depression   . HTN (hypertension)   . Hyperlipidemia    Past Surgical History:  Procedure Laterality Date  . CESAREAN SECTION     No Known Allergies No current facility-administered medications on file prior to encounter.    Current Outpatient Medications on File Prior to Encounter  Medication Sig Dispense Refill  . citalopram (CELEXA) 20 MG tablet Take 1 tablet (20 mg total) by mouth daily. 90 tablet 0  . lisinopril-hydrochlorothiazide (PRINZIDE,ZESTORETIC) 10-12.5 MG per tablet Take 1 tablet by mouth daily.    . simvastatin (ZOCOR) 20 MG tablet Take 20 mg by mouth every other day.     . vitamin  B-12 (CYANOCOBALAMIN) 100 MCG tablet Take 100 mcg by mouth daily.    . mirtazapine (REMERON) 30 MG tablet Take 1 tablet (30 mg total) by mouth at bedtime. (Patient taking differently: Take 30 mg by mouth at bedtime as needed (sleep). ) 90 tablet 0   Social History   Socioeconomic History  . Marital status: Married    Spouse name: Not on file  . Number of children: Not on file  . Years of education: Not on file  . Highest education level: Not on file  Occupational History  . Occupation: mortgage collections  Social Needs  . Financial resource strain: Not on file  . Food insecurity:    Worry: Not on file    Inability: Not on file  . Transportation needs:    Medical: Not on file    Non-medical: Not on file  Tobacco Use  . Smoking status: Never Smoker  . Smokeless tobacco: Never Used  Substance and Sexual Activity  . Alcohol use: No    Alcohol/week: 0.0 standard drinks  . Drug use: No  . Sexual activity: Yes  Lifestyle  . Physical activity:    Days per week: Not on file    Minutes per session: Not on file  . Stress: Not on file  Relationships  . Social connections:    Talks on phone: Not on file    Gets together: Not on file    Attends religious service: Not on file    Active member of club or organization: Not on file    Attends meetings of clubs or organizations:  Not on file    Relationship status: Not on file  . Intimate partner violence:    Fear of current or ex partner: Not on file    Emotionally abused: Not on file    Physically abused: Not on file    Forced sexual activity: Not on file  Other Topics Concern  . Not on file  Social History Narrative  . Not on file   Family History  Problem Relation Age of Onset  . Depression Mother   . Anxiety disorder Mother   . Alcohol abuse Mother   . Depression Maternal Aunt   . Anxiety disorder Maternal Aunt      OBJECTIVE:  Vitals:   05/29/18 1417  BP: 93/64  Pulse: 96  Resp: 20  Temp: 99.3 F (37.4 C)    TempSrc: Oral  SpO2: 97%    General appearance: alert; no distress Eyes: PERRLA; EOMI; conjunctiva normal HENT: normocephalic; atraumatic Neck: supple Lungs: clear to auscultation bilaterally without adventitious breath sounds; taking shallow breaths Heart: regular rate and rhythm.  Clear S1 and S2 without rubs, gallops, or murmur. Chest Wall: TTP with lateral and AP compression of chest Abdomen: soft, non-tender; bowel sounds normal; no guarding Extremities: no cyanosis or edema; symmetrical with no gross deformities Skin: warm and dry Psychological: alert and cooperative; anxious mood and affect; pressured speech  ECG: Orders placed or performed during the hospital encounter of 05/29/18  . ED EKG  . ED EKG   EKG normal sinus rhythm without ST elevations, depressions, or prolonged PR interval.  No narrowing or widening of the QRS complexes.  Reviewed EKG with Dr. Milus GlazierLauenstein.  Comparable to past EKG.    DIAGNOSTIC STUDIES:  Dg Chest 2 View  Result Date: 05/29/2018 CLINICAL DATA:  Chest pain and shortness of breath. Pain with inspiration. EXAM: CHEST - 2 VIEW COMPARISON:  Two-view chest x-ray 07/22/2015 FINDINGS: Heart size is normal. Bibasilar airspace disease is new. Small effusions are suspected. IMPRESSION: New bibasilar airspace disease. While this likely reflects atelectasis. Infection is not excluded. Electronically Signed   By: Marin Robertshristopher  Mattern M.D.   On: 05/29/2018 15:20     ASSESSMENT & PLAN:  1. Lower respiratory tract infection   2. Chest wall pain   3. Costochondritis    Discussed patient case with Dr. Milus GlazierLauenstein.    Meds ordered this encounter  Medications  . amoxicillin (AMOXIL) 500 MG capsule    Sig: Take 1 capsule (500 mg total) by mouth 2 (two) times daily for 10 days.    Dispense:  20 capsule    Refill:  0    Order Specific Question:   Supervising Provider    Answer:   Eustace MooreNELSON, YVONNE SUE [[id number]]  . doxycycline (VIBRAMYCIN) 100 MG capsule    Sig:  Take 1 capsule (100 mg total) by mouth 2 (two) times daily.    Dispense:  20 capsule    Refill:  0    Order Specific Question:   Supervising Provider    Answer:   Eustace MooreELSON, YVONNE SUE [[id number]]   Chest x-ray showed possible pneumonia. Prescriptions sent.  Take as directed and to completion Follow up with PCP next week for recheck and further evaluation and management  EKG unremarkable today.   Declines further work-up in the ED at this time Cardiac cause unlikely given normal EKG and symptoms reproducible on exam.   Symptoms most likely secondary to cough.   You may take OTC ibuprofen or tylenol as needed for pain If symptoms  persists please follow up with PCP  Go to the ED if you have any new or worsening symptoms such as fever, chills, nausea, vomiting, sweating, lightheadedness, dizziness, worsening or changing chest pain, abdominal pain, changes in bowel habits, neurological deficits, slurred speech, weakness in arms and/or legs, calf pain or swelling, etc...  Chest pain precautions given. Reviewed expectations re: course of current medical issues. Questions answered. Outlined signs and symptoms indicating need for more acute intervention. Patient verbalized understanding. After Visit Summary given.   Rennis Harding, PA-C 05/29/18 1643

## 2018-05-29 NOTE — Discharge Instructions (Signed)
Chest x-ray showed possible pneumonia. Prescriptions sent.  Take as directed and to completion Follow up with PCP next week for recheck and further evaluation and management  EKG unremarkable today.   Declines further work-up in the ED.   Cardiac cause unlikely given normal EKG and symptoms reproducible on exam.   Symptoms most likely secondary to cough.   You may take OTC ibuprofen or tylenol as needed for pain If symptoms persists please follow up with PCP  Go to the ED if you have any new or worsening symptoms such as fever, chills, nausea, vomiting, sweating, lightheadedness, dizziness, worsening or changing chest pain, abdominal pain, changes in bowel habits, neurological deficits, slurred speech, weakness in arms and/or legs, calf pain or swelling, etc..[name]

## 2018-06-29 ENCOUNTER — Ambulatory Visit (INDEPENDENT_AMBULATORY_CARE_PROVIDER_SITE_OTHER): Payer: 59

## 2018-06-29 ENCOUNTER — Other Ambulatory Visit: Payer: Self-pay

## 2018-06-29 ENCOUNTER — Encounter (HOSPITAL_COMMUNITY): Payer: Self-pay | Admitting: *Deleted

## 2018-06-29 ENCOUNTER — Emergency Department (HOSPITAL_COMMUNITY)
Admission: EM | Admit: 2018-06-29 | Discharge: 2018-06-29 | Disposition: A | Discharge status: Home / Self Care | Payer: 59 | Attending: Emergency Medicine | Admitting: Emergency Medicine

## 2018-06-29 ENCOUNTER — Emergency Department (HOSPITAL_COMMUNITY): Payer: 59

## 2018-06-29 ENCOUNTER — Ambulatory Visit (HOSPITAL_COMMUNITY)
Admission: EM | Admit: 2018-06-29 | Discharge: 2018-06-29 | Disposition: A | Discharge status: Home / Self Care | Payer: 59 | Source: Home / Self Care | Attending: Family Medicine | Admitting: Family Medicine

## 2018-06-29 DIAGNOSIS — J101 Influenza due to other identified influenza virus with other respiratory manifestations: Secondary | ICD-10-CM | POA: Insufficient documentation

## 2018-06-29 DIAGNOSIS — M791 Myalgia, unspecified site: Secondary | ICD-10-CM | POA: Insufficient documentation

## 2018-06-29 DIAGNOSIS — I10 Essential (primary) hypertension: Secondary | ICD-10-CM | POA: Insufficient documentation

## 2018-06-29 DIAGNOSIS — R531 Weakness: Secondary | ICD-10-CM | POA: Diagnosis not present

## 2018-06-29 DIAGNOSIS — J40 Bronchitis, not specified as acute or chronic: Secondary | ICD-10-CM | POA: Insufficient documentation

## 2018-06-29 DIAGNOSIS — J181 Lobar pneumonia, unspecified organism: Secondary | ICD-10-CM

## 2018-06-29 DIAGNOSIS — R0609 Other forms of dyspnea: Secondary | ICD-10-CM | POA: Diagnosis not present

## 2018-06-29 DIAGNOSIS — R0602 Shortness of breath: Secondary | ICD-10-CM

## 2018-06-29 DIAGNOSIS — R05 Cough: Secondary | ICD-10-CM

## 2018-06-29 DIAGNOSIS — Z79899 Other long term (current) drug therapy: Secondary | ICD-10-CM | POA: Diagnosis not present

## 2018-06-29 DIAGNOSIS — J189 Pneumonia, unspecified organism: Secondary | ICD-10-CM

## 2018-06-29 DIAGNOSIS — R509 Fever, unspecified: Secondary | ICD-10-CM | POA: Diagnosis not present

## 2018-06-29 HISTORY — DX: Calculus of gallbladder without cholecystitis without obstruction: K80.20

## 2018-06-29 LAB — CBC
HCT: 37.1 % (ref 36.0–46.0)
Hemoglobin: 11.8 g/dL — ABNORMAL LOW (ref 12.0–15.0)
MCH: 28.2 pg (ref 26.0–34.0)
MCHC: 31.8 g/dL (ref 30.0–36.0)
MCV: 88.8 fL (ref 80.0–100.0)
Platelets: 280 10*3/uL (ref 150–400)
RBC: 4.18 MIL/uL (ref 3.87–5.11)
RDW: 12.1 % (ref 11.5–15.5)
WBC: 3.4 10*3/uL — ABNORMAL LOW (ref 4.0–10.5)
nRBC: 0 % (ref 0.0–0.2)

## 2018-06-29 LAB — COMPREHENSIVE METABOLIC PANEL
ALT: 11 U/L (ref 0–44)
AST: 22 U/L (ref 15–41)
Albumin: 3.3 g/dL — ABNORMAL LOW (ref 3.5–5.0)
Alkaline Phosphatase: 74 U/L (ref 38–126)
Anion gap: 14 (ref 5–15)
BUN: <5 mg/dL — ABNORMAL LOW (ref 8–23)
CO2: 23 mmol/L (ref 22–32)
Calcium: 8.9 mg/dL (ref 8.9–10.3)
Chloride: 99 mmol/L (ref 98–111)
Creatinine, Ser: 0.59 mg/dL (ref 0.44–1.00)
GFR calc Af Amer: >60 mL/min (ref >60)
GFR calc non Af Amer: >60 mL/min (ref >60)
Glucose, Bld: 76 mg/dL (ref 70–99)
POTASSIUM: 3.3 mmol/L — AB (ref 3.5–5.1)
Sodium: 136 mmol/L (ref 135–145)
Total Bilirubin: 0.5 mg/dL (ref 0.3–1.2)
Total Protein: 8.2 g/dL — ABNORMAL HIGH (ref 6.5–8.1)

## 2018-06-29 LAB — TROPONIN I: Troponin I: <0.03 ng/mL (ref <0.03)

## 2018-06-29 LAB — INFLUENZA PANEL BY PCR (TYPE A & B)
INFLAPCR: NEGATIVE
INFLBPCR: POSITIVE — AB

## 2018-06-29 LAB — BRAIN NATRIURETIC PEPTIDE: B NATRIURETIC PEPTIDE 5: 8.1 pg/mL (ref 0.0–100.0)

## 2018-06-29 MED ORDER — PREDNISONE 20 MG PO TABS
60.0000 mg | ORAL_TABLET | Freq: Once | ORAL | Status: AC
  Administered 2018-06-29: 60 mg via ORAL
  Filled 2018-06-29: qty 3

## 2018-06-29 MED ORDER — OSELTAMIVIR PHOSPHATE 75 MG PO CAPS: 75.0000 mg | ORAL_CAPSULE | Freq: Two times a day (BID) | ORAL | 0 refills | Status: DC

## 2018-06-29 MED ORDER — IOHEXOL 350 MG/ML SOLN
100.0000 mL | Freq: Once | INTRAVENOUS | Status: AC | PRN
  Administered 2018-06-29: 100 mL via INTRAVENOUS

## 2018-06-29 MED ORDER — PREDNISONE 20 MG PO TABS: 40.0000 mg | ORAL_TABLET | Freq: Every day | ORAL | 0 refills | Status: AC

## 2018-06-29 MED ORDER — ALBUTEROL SULFATE HFA 108 (90 BASE) MCG/ACT IN AERS
2.0000 | INHALATION_SPRAY | RESPIRATORY_TRACT | Status: DC
  Administered 2018-06-29: 2 via RESPIRATORY_TRACT
  Filled 2018-06-29: qty 6.7

## 2018-06-29 MED ORDER — OSELTAMIVIR PHOSPHATE 75 MG PO CAPS
75.0000 mg | ORAL_CAPSULE | Freq: Once | ORAL | Status: AC
  Administered 2018-06-29: 75 mg via ORAL
  Filled 2018-06-29: qty 1

## 2018-06-29 MED ORDER — AEROCHAMBER PLUS FLO-VU LARGE MISC
1.0000 | Freq: Once | Status: AC
  Administered 2018-06-29: 1

## 2018-06-29 NOTE — ED Notes (Signed)
Patient transported to CT 

## 2018-06-29 NOTE — ED Notes (Signed)
ED Charge RN notified of pt being possible Covid-19 pt; report given.  Per Consulting civil engineer, room is reserved for pt upon arrival.

## 2018-06-29 NOTE — ED Triage Notes (Signed)
C/O being diagnosed with virus, then pneumonia starting 3 wks ago.  Has completed 2 courses abx with only some improvement with second abx.  States continues with cough, SOB, feeling feverish at night.

## 2018-06-29 NOTE — Discharge Instructions (Signed)
You have been on three antibiotics without improvement in your chest xray or symptoms.  I recommend further evaluation and treatment in the ER at this point.

## 2018-06-29 NOTE — Discharge Instructions (Signed)
Use the albuterol inhaler with spacer every 4 hours for next two days, then every 4 hours as needed for cough and shortness of breath

## 2018-06-29 NOTE — ED Provider Notes (Signed)
[facility]    CSN: [id number] Arrival date & time: 06/29/18  1332     History   Chief Complaint Chief Complaint  Patient presents with   Shortness of Breath   Cough    HPI Ruth Brown is a 63 y.o. female.   Ruth Brown presents today with persistent symptoms. Went to PCP on 2/14 and was treated with azithromycin after being ill for 9 days. Came to urgent care with concerning xray on 2/19, was given amoxicillin and doxycyline. Returned to PCP on 2/28, O2 of 94% at that time but hadn't completed antibiotics, was provided with an inhaler. Presents today feeling worse. Had to leave work because of fatigue, she was there for only 4 hours, otherwise has been off since illness initiated. Very fatigued even at home. Has to sit upright, can't lay down, makes her short of breath. Has been using inhaler, hleps for a few hours. Chest pain with cough. Shortness of breath  With activity. Slight production with cough with clear discharge. Fevers, hot and cold sweats. Once she had a temp of 101 but no longer has a functioning thermometer When finished antibiotics, felt a little better for approxijmately a week, but never complete resolution. No previous similar. Has never smoked. Mild runny nose. No ear pain. Some sore throat. Vomiting a few nights ago, although she feels it was from soup she ate. Mild nausea. Dry cough primarily, worse with laying. Husband with a cough last week, he is now better. No other known ill contacts or exposures. Was in [place] January 20th, no travel since. No leg pain or swelling. No history of blood clots. Hx of anxiety, depression, htn, hyperlipidemia.    ROS per HPI, negative if not otherwise mentioned.      Past Medical History:  Diagnosis Date   Anxiety    Arthritis    Depression    Gallstone    HTN (hypertension)    Hyperlipidemia     Patient Active Problem List   Diagnosis Date Noted   Colitis 10/22/2016   Breast mass  in female 10/22/2016   Essential hypertension 10/22/2016   Depression, major, recurrent, moderate (HCC) 09/07/2015    Class: Chronic   Anxiety 12/16/2014   Gonalgia 12/08/2014   Adjustment disorder with mixed anxiety and depressed mood 10/26/2014    Class: Acute   HLD (hyperlipidemia) 06/16/2014    Past Surgical History:  Procedure Laterality Date   CESAREAN SECTION      OB History   No obstetric history on file.      Home Medications    Prior to Admission medications   Medication Sig Start Date End Date Taking? Authorizing Provider  citalopram (CELEXA) 20 MG tablet Take 1 tablet (20 mg total) by mouth daily. 07/06/16  Yes Arfeen, Phillips Grout, MD  lisinopril-hydrochlorothiazide (PRINZIDE,ZESTORETIC) 10-12.5 MG per tablet Take 1 tablet by mouth daily.   Yes [provider]  simvastatin (ZOCOR) 20 MG tablet Take 20 mg by mouth every other day.    Yes [provider]  UNKNOWN TO PATIENT "a nerve pill"   Yes [provider]  vitamin B-12 (CYANOCOBALAMIN) 100 MCG tablet Take 100 mcg by mouth daily.   Yes [provider]  doxycycline (VIBRAMYCIN) 100 MG capsule Take 1 capsule (100 mg total) by mouth 2 (two) times daily. 05/29/18   Wurst, [place], PA-C  mirtazapine (REMERON) 30 MG tablet Take 1 tablet (30 mg total) by mouth at bedtime. Patient taking differently: Take  30 mg by mouth at bedtime as needed (sleep).  07/06/16 07/06/17  Cleotis Nipper, MD    Family History Family History  Problem Relation Age of Onset   Depression Mother    Anxiety disorder Mother    Alcohol abuse Mother    Depression Maternal Aunt    Anxiety disorder Maternal Aunt     Social History Social History   Tobacco Use   Smoking status: Never Smoker   Smokeless tobacco: Never Used  Substance Use Topics   Alcohol use: No    Alcohol/week: 0.0 standard drinks   Drug use: No     Allergies   Patient has no known allergies.   Review of Systems Review  of Systems   Physical Exam Triage Vital Signs ED Triage Vitals  Enc Vitals Group     BP 06/29/18 1415 125/62     Pulse Rate 06/29/18 1415 (!) 113     Resp --      Temp 06/29/18 1415 99.7 F (37.6 C)     Temp Source 06/29/18 1415 Oral     SpO2 06/29/18 1415 100 %     Weight --      Height --      Head Circumference --      Peak Flow --      Pain Score 06/29/18 1416 8     Pain Loc --      Pain Edu? --      Excl. in GC? --    No data found.  Updated Vital Signs BP 125/62    Pulse (!) 113    Temp 99.7 F (37.6 C) (Oral)    LMP  (LMP Unknown)    SpO2 100%    Physical Exam Constitutional:      General: She is not in acute distress.    Appearance: She is well-developed.  Cardiovascular:     Rate and Rhythm: Regular rhythm. Tachycardia present.     Heart sounds: Normal heart sounds.  Pulmonary:     Effort: Pulmonary effort is normal.     Breath sounds: Examination of the right-lower field reveals decreased breath sounds. Examination of the left-lower field reveals decreased breath sounds. Decreased breath sounds present.     Comments: No increased work of breathing, tachypnea or respiratory distress Skin:    General: Skin is warm and dry.  Neurological:     Mental Status: She is alert and oriented to person, place, and time.    Approximately 15 minutes spent obtaining history and physical exam with patient, greater than half of time spent >6 ft apart in large exam room with gown and mask in place.   UC Treatments / Results  Labs (all labs ordered are listed, but only abnormal results are displayed) Labs Reviewed - No data to display  EKG None  Radiology Dg Chest 2 View  Result Date: 06/29/2018 CLINICAL DATA:  Patient c/o cough, fever, sob x 2 weeks. Hx of htn Non-smoker. EXAM: CHEST - 2 VIEW COMPARISON:  Chest x-ray dated 05/29/2018 FINDINGS: Streaky bibasilar opacities are stable. No new lung findings. Heart size and mediastinal contours are stable. Osseous  structures about the chest are unremarkable. IMPRESSION: Streaky bibasilar airspace opacities, stable compared to the most recent chest x-ray of 05/29/2018 but new compared to an earlier chest x-ray of 07/22/2015. Findings again most likely represent atelectasis, however, persistent bibasilar pneumonia remains a possibility. Electronically Signed   By: Bary Richard M.D.   On: 06/29/2018 14:41  Procedures Procedures (including critical care time)  Medications Ordered in UC Medications - No data to display  Initial Impression / Assessment and Plan / UC Course  I have reviewed the triage vital signs and the nursing notes.  Pertinent labs & imaging results that were available during my care of the patient were reviewed by me and considered in my medical decision making (see chart for details).     No improvement in chest xray in 1 month, s/p three course of antibiotics. Mild tachycardia, low grade temps. Patient unable to lay flat due to cough. Significant fatigue. Multiple illness over the course of the month vs failed outpatient antibiotic course vs pe vs viral etiology vs metastatic disease all considered. Recommend more thorough evaluation in the ER at this time. Patient agreeable to plan. ER notified of patient and nurse assist to ER with precautions in place.   Final Clinical Impressions(s) / UC Diagnoses   Final diagnoses:  Pneumonia of both lower lobes due to infectious organism [facility])     Discharge Instructions     You have been on three antibiotics without improvement in your chest xray or symptoms.  I recommend further evaluation and treatment in the ER at this point.     ED Prescriptions    None     Controlled Substance Prescriptions [place] Controlled Substance Registry consulted? Not Applicable   Georgetta Haber, NP 06/29/18 1504

## 2018-06-29 NOTE — ED Triage Notes (Addendum)
Pt arrives from [facility] urgent care c/o [facility], fever, chills, body aches, dry hacking cough since June 10, 2018. Pt states she took a trip to [place] in January 2020 and began feeling these sxs in February 2020; states the sxs "were building up" Pt reports a fever of 103 for the last 5 nights. Pt also reports being on antibiotics; for dental work and pneumonia with respiratory sxs not resolving.

## 2018-06-30 NOTE — ED Provider Notes (Signed)
[facility] EMERGENCY DEPARTMENT Provider Note   CSN: [id number] Arrival date & time: 06/29/18  1518    History   Chief Complaint Chief Complaint  Patient presents with  . Shortness of Breath  . Fever  . Cough    HPI Ruth Brown is a 63 y.o. female.     HPI Patient is a 63 year old female who presents the emergency department with ongoing shortness of breath fever chills and body aches for the past 48 hours.  She is had a dry cough since the end of February and early March.  She has been on a course of both doxycycline and amoxicillin.  She reports a fever of 103 over the past 4-5 nights.  She did travel to [place] in January of this year.  No recent sick contacts.  No contacts with anyone with COVID-19 or patient is under investigation.  She feels generally weak.  She reports some shortness of breath with exertion.   Past Medical History:  Diagnosis Date  . Anxiety   . Arthritis   . Depression   . Gallstone   . HTN (hypertension)   . Hyperlipidemia     Patient Active Problem List   Diagnosis Date Noted  . Colitis 10/22/2016  . Breast mass in female 10/22/2016  . Essential hypertension 10/22/2016  . Depression, major, recurrent, moderate (HCC) 09/07/2015    Class: Chronic  . Anxiety 12/16/2014  . Gonalgia 12/08/2014  . Adjustment disorder with mixed anxiety and depressed mood 10/26/2014    Class: Acute  . HLD (hyperlipidemia) 06/16/2014    Past Surgical History:  Procedure Laterality Date  . CESAREAN SECTION       OB History   No obstetric history on file.      Home Medications    Prior to Admission medications   Medication Sig Start Date End Date Taking? Authorizing Provider  acetaminophen-codeine (TYLENOL #3) 300-30 MG tablet Take 1 tablet by mouth every 6 (six) hours as needed. for pain 06/14/18  Yes [provider]  albuterol (PROVENTIL HFA;VENTOLIN HFA) 108 (90 Base) MCG/ACT inhaler Inhale 2 puffs into the lungs  every 6 (six) hours as needed for wheezing or shortness of breath. 06/07/18  Yes [provider]  busPIRone (BUSPAR) 10 MG tablet Take 10 mg by mouth daily. 05/16/18  Yes [provider]  citalopram (CELEXA) 20 MG tablet Take 1 tablet (20 mg total) by mouth daily. 07/06/16  Yes Arfeen, Phillips Grout, MD  fluticasone (FLONASE) 50 MCG/ACT nasal spray Place 2 sprays into both nostrils daily.   Yes [provider]  ibuprofen (ADVIL,MOTRIN) 800 MG tablet Take 800 mg by mouth every 6 (six) hours as needed for pain. 05/24/18  Yes [provider]  lisinopril-hydrochlorothiazide (PRINZIDE,ZESTORETIC) 10-12.5 MG per tablet Take 1 tablet by mouth daily.   Yes [provider]  naproxen (NAPROSYN) 375 MG tablet Take 375 mg by mouth daily as needed for pain.   Yes [provider]  Pseudoeph-Doxylamine-DM-APAP (NYQUIL PO) Take 1 Dose by mouth at bedtime as needed (cough and cold sx).   Yes [provider]  simvastatin (ZOCOR) 20 MG tablet Take 20 mg by mouth every other day.    Yes [provider]  vitamin B-12 (CYANOCOBALAMIN) 100 MCG tablet Take 100 mcg by mouth daily.   Yes [provider]  doxycycline (VIBRAMYCIN) 100 MG capsule Take 1 capsule (100 mg total) by mouth 2 (two) times daily. Patient not taking: Reported on 06/29/2018 05/29/18  Wurst, [place], PA-C  mirtazapine (REMERON) 30 MG tablet Take 1 tablet (30 mg total) by mouth at bedtime. Patient not taking: Reported on 06/29/2018 07/06/16 07/06/17  Cleotis Nipper, MD  oseltamivir (TAMIFLU) 75 MG capsule Take 1 capsule (75 mg total) by mouth every 12 (twelve) hours. 06/29/18   Azalia Bilis, MD  predniSONE (DELTASONE) 20 MG tablet Take 2 tablets (40 mg total) by mouth daily for 5 days. 06/29/18 07/04/18  Azalia Bilis, MD    Family History Family History  Problem Relation Age of Onset  . Depression Mother   . Anxiety disorder Mother   . Alcohol abuse Mother   . Depression Maternal Aunt    . Anxiety disorder Maternal Aunt     Social History Social History   Tobacco Use  . Smoking status: Never Smoker  . Smokeless tobacco: Never Used  Substance Use Topics  . Alcohol use: No    Alcohol/week: 0.0 standard drinks  . Drug use: No     Allergies   Patient has no known allergies.   Review of Systems Review of Systems  All other systems reviewed and are negative.    Physical Exam Updated Vital Signs BP 110/76   Pulse 99   Temp 100.2 F (37.9 C) (Oral)   Resp (!) 27   Ht 5' (1.524 m)   Wt 63 kg   LMP  (LMP Unknown)   SpO2 97%   BMI 27.15 kg/m   Physical Exam Vitals signs and nursing note reviewed.  Constitutional:      General: She is not in acute distress.    Appearance: She is well-developed.  HENT:     Head: Normocephalic and atraumatic.  Neck:     Musculoskeletal: Normal range of motion.  Cardiovascular:     Rate and Rhythm: Normal rate and regular rhythm.     Heart sounds: Normal heart sounds.  Pulmonary:     Effort: Pulmonary effort is normal.     Breath sounds: Normal breath sounds.  Abdominal:     General: There is no distension.     Palpations: Abdomen is soft.     Tenderness: There is no abdominal tenderness.  Musculoskeletal: Normal range of motion.  Skin:    General: Skin is warm and dry.  Neurological:     Mental Status: She is alert and oriented to person, place, and time.  Psychiatric:        Judgment: Judgment normal.      ED Treatments / Results  Labs (all labs ordered are listed, but only abnormal results are displayed) Labs Reviewed  CBC - Abnormal; Notable for the following components:      Result Value   WBC 3.4 (*)    Hemoglobin 11.8 (*)    All other components within normal limits  COMPREHENSIVE METABOLIC PANEL - Abnormal; Notable for the following components:   Potassium 3.3 (*)    BUN <5 (*)    Total Protein 8.2 (*)    Albumin 3.3 (*)    All other components within normal limits  INFLUENZA PANEL BY  PCR (TYPE A & B) - Abnormal; Notable for the following components:   Influenza B By PCR POSITIVE (*)    All other components within normal limits  CULTURE, BLOOD (ROUTINE X 2)  CULTURE, BLOOD (ROUTINE X 2)  TROPONIN I  BRAIN NATRIURETIC PEPTIDE    EKG EKG Interpretation  Date/Time:  Saturday June 29 2018 15:22:57 EDT Ventricular Rate:  107 PR Interval:  QRS Duration: 84 QT Interval:  328 QTC Calculation: 438 R Axis:   17 Text Interpretation:  Sinus tachycardia No significant change was found Confirmed by Azalia Bilis ([id number]) on 06/29/2018 5:37:44 PM   Radiology Dg Chest 2 View  Result Date: 06/29/2018 CLINICAL DATA:  Patient c/o cough, fever, sob x 2 weeks. Hx of htn Non-smoker. EXAM: CHEST - 2 VIEW COMPARISON:  Chest x-ray dated 05/29/2018 FINDINGS: Streaky bibasilar opacities are stable. No new lung findings. Heart size and mediastinal contours are stable. Osseous structures about the chest are unremarkable. IMPRESSION: Streaky bibasilar airspace opacities, stable compared to the most recent chest x-ray of 05/29/2018 but new compared to an earlier chest x-ray of 07/22/2015. Findings again most likely represent atelectasis, however, persistent bibasilar pneumonia remains a possibility. Electronically Signed   By: Bary Richard M.D.   On: 06/29/2018 14:41   Ct Chest Wo Contrast  Result Date: 06/29/2018 CLINICAL DATA:  Persistent bibasilar infiltrates on recent chest x-ray EXAM: CT CHEST WITHOUT CONTRAST TECHNIQUE: Multidetector CT imaging of the chest was performed following the standard protocol without IV contrast. COMPARISON:  Chest x-rays from 05/29/2018 and 06/29/2018 as well as a prior CT of the abdomen and pelvis from 10/21/2016. FINDINGS: Cardiovascular: Somewhat limited due to lack of IV contrast. No significant atherosclerotic calcifications are seen. No aneurysmal dilatation is noted. Heart is not significantly enlarged. Mediastinum/Nodes: Thoracic inlet is within normal  limits. No sizable hilar or mediastinal adenopathy is noted. Scattered hilar and mediastinal calcified lymph nodes are seen consistent with prior granulomatous disease. The esophagus is air-filled. Lungs/Pleura: Lungs are well aerated bilaterally. Mild subpleural fibrotic changes are noted throughout both lungs. Bronchiectasis and perivascular ground-glass attenuation is noted in the lower lobes bilaterally which corresponds to that seen on recent chest x-ray. This is felt to be more likely related to chronic interstitial disease. No sizable nodules are seen. No focal effusion is noted. Upper Abdomen: Visualized upper abdomen is within normal limits. Musculoskeletal: Degenerative changes of the thoracic spine are noted. No acute bony abnormality is seen. IMPRESSION: Bibasilar changes likely related to a more chronic underlying interstitial process. No focal confluent infiltrate is seen at this time. Scattered subpleural interstitial fibrotic changes are noted as well. No acute abnormality is seen. Electronically Signed   By: Alcide Clever M.D.   On: 06/29/2018 19:43    Procedures Procedures (including critical care time)  Medications Ordered in ED Medications  oseltamivir (TAMIFLU) capsule 75 mg (75 mg Oral Given 06/29/18 1815)  iohexol (OMNIPAQUE) 350 MG/ML injection 100 mL (100 mLs Intravenous Contrast Given 06/29/18 1842)  AeroChamber Plus Flo-Vu Large MISC 1 each (1 each Other Given 06/29/18 2031)  predniSONE (DELTASONE) tablet 60 mg (60 mg Oral Given 06/29/18 2031)     Initial Impression / Assessment and Plan / ED Course  I have reviewed the triage vital signs and the nursing notes.  Pertinent labs & imaging results that were available during my care of the patient were reviewed by me and considered in my medical decision making (see chart for details).        Hydrate in the emergency department.  Feels better at this time.  Patient is positive for influenza B.  I personally ambulated with  the patient around the emergency department and she feels much better at time of discharge.  Patient will be prescribed Tamiflu.  Ongoing oral hydration at home.  Fever control with ibuprofen and Tylenol.  Stable for discharge.  Patient and family understand to return to  the emergency department for new or worsening symptoms. no pneumonia on CT imaging today.  Final Clinical Impressions(s) / ED Diagnoses   Final diagnoses:  Dyspnea on exertion  Influenza B  Bronchitis    ED Discharge Orders         Ordered    predniSONE (DELTASONE) 20 MG tablet  Daily     06/29/18 2008    oseltamivir (TAMIFLU) 75 MG capsule  Every 12 hours     06/29/18 2008           Azalia Bilis, MD 06/30/18 1843

## 2018-07-01 ENCOUNTER — Other Ambulatory Visit: Payer: Self-pay

## 2018-07-01 ENCOUNTER — Institutional Professional Consult (permissible substitution): Payer: 59 | Admitting: Pulmonary Disease

## 2018-07-01 NOTE — Progress Notes (Deleted)
Subjective:   PATIENT ID: Ruth Brown GENDER: female DOB: 10/05/1955, MRN: [id number]   HPI  No chief complaint on file.   Reason for Visit: New consult for ED follow-up  [name] is a 63 year old female with hypertension, hyperlipidemia and anxiety.  In the last month, she has presented to the ED x2 and PCP for respiratory symptoms.   On 05/24/2018, treated with azithromycin and Tessalon Perles On 05/29/2018, she complained of shortness of breath and chest pressure thought secondary to cough.  Chest x-ray with bibasilar airspace disease.  She was treated for possible pneumonia with doxycycline and amoxicillin. On 06/07/2018 seen for follow-up, noting oxygen saturation around 90 to 94% On 06/29/2018, seen in [facility] urgent care.  Complaining of fever and fatigue and nonproductive cough.  Chest x-ray repeated and stable with bibasilar airspace opacities.  Patient instructed to present to ED.  In the ED she was found to be influenza B positive.  Discharged home.  Travel history: [place] in January, 2 months ago Social History: Environmental exposures: ***  I have personally reviewed patient's past medical/family/social history, allergies, current medications.  Past Medical History:  Diagnosis Date  . Anxiety   . Arthritis   . Depression   . Gallstone   . HTN (hypertension)   . Hyperlipidemia      Family History  Problem Relation Age of Onset  . Depression Mother   . Anxiety disorder Mother   . Alcohol abuse Mother   . Depression Maternal Aunt   . Anxiety disorder Maternal Aunt      Social History   Occupational History  . Occupation: mortgage collections  Tobacco Use  . Smoking status: Never Smoker  . Smokeless tobacco: Never Used  Substance and Sexual Activity  . Alcohol use: No    Alcohol/week: 0.0 standard drinks  . Drug use: No  . Sexual activity: Not on file    No Known Allergies   Outpatient Medications Prior to Visit  Medication Sig  Dispense Refill  . acetaminophen-codeine (TYLENOL #3) 300-30 MG tablet Take 1 tablet by mouth every 6 (six) hours as needed. for pain    . albuterol (PROVENTIL HFA;VENTOLIN HFA) 108 (90 Base) MCG/ACT inhaler Inhale 2 puffs into the lungs every 6 (six) hours as needed for wheezing or shortness of breath.    . busPIRone (BUSPAR) 10 MG tablet Take 10 mg by mouth daily.    . citalopram (CELEXA) 20 MG tablet Take 1 tablet (20 mg total) by mouth daily. 90 tablet 0  . doxycycline (VIBRAMYCIN) 100 MG capsule Take 1 capsule (100 mg total) by mouth 2 (two) times daily. (Patient not taking: Reported on 06/29/2018) 20 capsule 0  . fluticasone (FLONASE) 50 MCG/ACT nasal spray Place 2 sprays into both nostrils daily.    [name] ibuprofen (ADVIL,MOTRIN) 800 MG tablet Take 800 mg by mouth every 6 (six) hours as needed for pain.    [name] lisinopril-hydrochlorothiazide (PRINZIDE,ZESTORETIC) 10-12.5 MG per tablet Take 1 tablet by mouth daily.    . mirtazapine (REMERON) 30 MG tablet Take 1 tablet (30 mg total) by mouth at bedtime. (Patient not taking: Reported on 06/29/2018) 90 tablet 0  . naproxen (NAPROSYN) 375 MG tablet Take 375 mg by mouth daily as needed for pain.    [name] oseltamivir (TAMIFLU) 75 MG capsule Take 1 capsule (75 mg total) by mouth every 12 (twelve) hours. 10 capsule 0  . predniSONE (DELTASONE) 20 MG tablet Take 2 tablets (40 mg total) by mouth  daily for 5 days. 10 tablet 0  . Pseudoeph-Doxylamine-DM-APAP (NYQUIL PO) Take 1 Dose by mouth at bedtime as needed (cough and cold sx).    . simvastatin (ZOCOR) 20 MG tablet Take 20 mg by mouth every other day.     . vitamin B-12 (CYANOCOBALAMIN) 100 MCG tablet Take 100 mcg by mouth daily.     No facility-administered medications prior to visit.     ROS   Objective:  There were no vitals filed for this visit.    Physical Exam: General: Well-appearing, no acute distress HENT: [place], AT, OP clear, MMM Eyes: EOMI, no scleral icterus Lymph: no cervical lymphadenopathy  Respiratory: Clear to auscultation bilaterally.  No crackles, wheezing or rales Cardiovascular: RRR, -M/R/G, no JVD GI: BS+, soft, nontender Extremities:-Edema,-tenderness Neuro: AAO x4, CNII-XII grossly intact Skin: Intact, no rashes or bruising Psych: Normal mood, normal affect  Data Reviewed:  Imaging:  PFT:  Labs:  Imaging, labs and tests noted above have been reviewed independently by me.    Assessment & Plan:   Discussion: ***  Health Maintenance Pneumonia*** Influenza*** CT Lung Screen***  No orders of the defined types were placed in this encounter. No orders of the defined types were placed in this encounter.   No follow-ups on file.  Ryman Rathgeber Mechele Collin, MD [facility] 07/01/2018 8:49 AM  Office Number [phone]

## 2018-07-04 LAB — CULTURE, BLOOD (ROUTINE X 2): Culture: NO GROWTH

## 2018-09-05 ENCOUNTER — Other Ambulatory Visit: Payer: Self-pay

## 2018-09-05 ENCOUNTER — Ambulatory Visit (HOSPITAL_COMMUNITY)
Admission: EM | Admit: 2018-09-05 | Discharge: 2018-09-05 | Disposition: A | Discharge status: Home / Self Care | Payer: 59 | Attending: Internal Medicine | Admitting: Internal Medicine

## 2018-09-05 ENCOUNTER — Ambulatory Visit (INDEPENDENT_AMBULATORY_CARE_PROVIDER_SITE_OTHER): Payer: 59

## 2018-09-05 ENCOUNTER — Encounter (HOSPITAL_COMMUNITY): Payer: Self-pay | Admitting: Emergency Medicine

## 2018-09-05 DIAGNOSIS — J40 Bronchitis, not specified as acute or chronic: Secondary | ICD-10-CM

## 2018-09-05 MED ORDER — ACETAMINOPHEN 325 MG PO TABS
650.0000 mg | ORAL_TABLET | Freq: Once | ORAL | Status: AC
  Administered 2018-09-05: 12:00:00 650 mg via ORAL

## 2018-09-05 MED ORDER — ACETAMINOPHEN 325 MG PO TABS
ORAL_TABLET | ORAL | Status: AC
  Filled 2018-09-05: qty 2

## 2018-09-05 MED ORDER — PREDNISONE 20 MG PO TABS: 20.0000 mg | ORAL_TABLET | Freq: Every day | ORAL | 0 refills | Status: AC

## 2018-09-05 MED ORDER — ALBUTEROL SULFATE (2.5 MG/3ML) 0.083% IN NEBU: 2.5000 mg | INHALATION_SOLUTION | Freq: Four times a day (QID) | RESPIRATORY_TRACT | 12 refills | Status: AC | PRN

## 2018-09-05 NOTE — ED Provider Notes (Signed)
[facility]    CSN: [id number] Arrival date & time: 09/05/18  1122     History   Chief Complaint Chief Complaint  Patient presents with  . Shortness of Breath    HPI Ruth Brown is a 63 y.o. female past medical history of hypertension, hyperlipidemia presenting for shortness of breath.  Patient states this is been ongoing since late March.  Patient underwent coronavirus testing in the ER on 06/29/2018: Negative.  Patient denies recent sick contacts, positive COVID since since then.  Patient denies smoking, seasonal allergies.  Patient states that she schedule appointment with respiratory for July.  Patient states that her throat feels scratchy, and that she has a dry, nonproductive, non-hemoptic cough that is triggered with deep breaths.  Patient was given a week of prednisone hospital discharge on 06/29/2018 which helped alleviate symptoms.  Patient has been using her albuterol inhaler with spacer daily without much relief.  Patient had inhaler and spacer present, demonstrated how she uses this.  Patient is unable, despite coaching, to take slow, deep inhalation.  She also has difficulty comprehending the timing of when to breathe and in relation to administration of albuterol.  She does not have nebulizer at home, has never tried this.  Denies history of blood clots, prolonged stasis, recent fracture or major surgery.  Currently not on any anticoagulation, does not take aspirin.  Patient does take her antihypertensives daily.  Of note, patient taking lisinopril-HCTZ.  Patient denies swelling of lips tongue, throat.  She has undergone lisinopril cessation trial.    Past Medical History:  Diagnosis Date  . Anxiety   . Arthritis   . Depression   . Gallstone   . HTN (hypertension)   . Hyperlipidemia     Patient Active Problem List   Diagnosis Date Noted  . Colitis 10/22/2016  . Breast mass in female 10/22/2016  . Essential hypertension 10/22/2016  . Depression, major,  recurrent, moderate (HCC) 09/07/2015    Class: Chronic  . Anxiety 12/16/2014  . Gonalgia 12/08/2014  . Adjustment disorder with mixed anxiety and depressed mood 10/26/2014    Class: Acute  . HLD (hyperlipidemia) 06/16/2014    Past Surgical History:  Procedure Laterality Date  . CESAREAN SECTION      OB History   No obstetric history on file.      Home Medications    Prior to Admission medications   Medication Sig Start Date End Date Taking? Authorizing Provider  albuterol (PROVENTIL HFA;VENTOLIN HFA) 108 (90 Base) MCG/ACT inhaler Inhale 2 puffs into the lungs every 6 (six) hours as needed for wheezing or shortness of breath. 06/07/18  Yes [provider]  busPIRone (BUSPAR) 10 MG tablet Take 10 mg by mouth daily. 05/16/18  Yes [provider]  citalopram (CELEXA) 20 MG tablet Take 1 tablet (20 mg total) by mouth daily. 07/06/16  Yes Arfeen, Phillips Grout, MD  fluticasone (FLONASE) 50 MCG/ACT nasal spray Place 2 sprays into both nostrils daily.   Yes [provider]  lisinopril-hydrochlorothiazide (PRINZIDE,ZESTORETIC) 10-12.5 MG per tablet Take 1 tablet by mouth daily.   Yes [provider]  naproxen (NAPROSYN) 375 MG tablet Take 375 mg by mouth daily as needed for pain.   Yes [provider]  Pseudoeph-Doxylamine-DM-APAP (NYQUIL PO) Take 1 Dose by mouth at bedtime as needed (cough and cold sx).   Yes [provider]  simvastatin (ZOCOR) 20 MG tablet Take 20 mg by mouth every other day.    Yes [provider]  acetaminophen-codeine (TYLENOL #3) 300-30 MG tablet Take 1 tablet by mouth every 6 (six) hours as needed. for pain 06/14/18   [provider]  albuterol (PROVENTIL) (2.5 MG/3ML) 0.083% nebulizer solution Take 3 mLs (2.5 mg total) by nebulization every 6 (six) hours as needed for wheezing or shortness of breath. 09/05/18   Hall-Potvin, [place], PA-C  doxycycline (VIBRAMYCIN) 100 MG capsule Take 1 capsule (100 mg total)  by mouth 2 (two) times daily. Patient not taking: Reported on 06/29/2018 05/29/18   Wurst, [place], PA-C  ibuprofen (ADVIL,MOTRIN) 800 MG tablet Take 800 mg by mouth every 6 (six) hours as needed for pain. 05/24/18   [provider]  mirtazapine (REMERON) 30 MG tablet Take 1 tablet (30 mg total) by mouth at bedtime. Patient not taking: Reported on 06/29/2018 07/06/16 07/06/17  Arfeen, Phillips GroutSyed T, MD  predniSONE (DELTASONE) 20 MG tablet Take 1 tablet (20 mg total) by mouth daily for 7 days. 09/05/18 09/12/18  Hall-Potvin, [place], PA-C  vitamin B-12 (CYANOCOBALAMIN) 100 MCG tablet Take 100 mcg by mouth daily.    [provider]    Family History Family History  Problem Relation Age of Onset  . Depression Mother   . Anxiety disorder Mother   . Alcohol abuse Mother   . Depression Maternal Aunt   . Anxiety disorder Maternal Aunt     Social History Social History   Tobacco Use  . Smoking status: Never Smoker  . Smokeless tobacco: Never Used  Substance Use Topics  . Alcohol use: No    Alcohol/week: 0.0 standard drinks  . Drug use: No     Allergies   Patient has no known allergies.   Review of Systems Review of Systems as per HPI   Physical Exam Triage Vital Signs ED Triage Vitals  Enc Vitals Group     BP 09/05/18 1143 122/62     Pulse Rate 09/05/18 1143 (!) 113     Resp 09/05/18 1143 (!) 22     Temp 09/05/18 1143 (!) 100.7 F (38.2 C)     Temp Source 09/05/18 1143 Oral     SpO2 09/05/18 1143 96 %     Weight --      Height --      Head Circumference --      Peak Flow --      Pain Score 09/05/18 1139 8     Pain Loc --      Pain Edu? --      Excl. in GC? --    No data found.  Updated Vital Signs BP 122/62 (BP Location: Right Arm)   Pulse (!) 113   Temp (!) 100.7 F (38.2 C) (Oral)   Resp (!) 22   LMP  (LMP Unknown)   SpO2 96%   Visual Acuity Right Eye Distance:   Left Eye Distance:   Bilateral Distance:    Right Eye Near:   Left Eye Near:     Bilateral Near:     Physical Exam Constitutional:      General: She is not in acute distress.    Appearance: She is normal weight.  HENT:     Head: Normocephalic and atraumatic.     Mouth/Throat:     Mouth: Mucous membranes are moist.     Pharynx: Oropharynx is clear. No pharyngeal swelling or oropharyngeal exudate.  Eyes:     Pupils: Pupils are equal, round, and reactive to light.  Neck:     Musculoskeletal: Neck  supple.     Trachea: No tracheal deviation.  Cardiovascular:     Rate and Rhythm: Regular rhythm. Tachycardia present.     Heart sounds: No murmur.  Pulmonary:     Effort: Pulmonary effort is normal. Tachypnea present. No accessory muscle usage or respiratory distress.     Breath sounds: No stridor. Examination of the right-lower field reveals rales. Examination of the left-lower field reveals rales. Decreased breath sounds and rales present. No wheezing or rhonchi.  Chest:     Chest wall: No deformity, tenderness or crepitus.  Lymphadenopathy:     Cervical: No cervical adenopathy.  Skin:    General: Skin is warm.     Capillary Refill: Capillary refill takes less than 2 seconds.     Coloration: Skin is not pale.  Neurological:     General: No focal deficit present.     Mental Status: She is alert and oriented to person, place, and time.     Cranial Nerves: No cranial nerve deficit.     Motor: No weakness.  Psychiatric:        Mood and Affect: Mood is not anxious.        Behavior: Behavior normal.      UC Treatments / Results  Labs (all labs ordered are listed, but only abnormal results are displayed) Labs Reviewed - No data to display  EKG None  Radiology Dg Chest 2 View  Result Date: 09/05/2018 CLINICAL DATA:  Shortness of breath and mild fever. EXAM: CHEST - 2 VIEW COMPARISON:  Chest CT 06/29/2018 and chest x-ray 06/29/2018 FINDINGS: The cardiac silhouette, mediastinal and hilar contours are within normal limits and stable. Stable bibasilar  predominant interstitial scarring changes. No infiltrates or effusions. No pulmonary edema or worrisome pulmonary lesions. The bony thorax is intact. IMPRESSION: Basilar predominant interstitial scarring type changes. No definite infiltrates or effusions. Electronically Signed   By: Rudie Meyer M.D.   On: 09/05/2018 12:51    Procedures Procedures (including critical care time)  Medications Ordered in UC Medications  acetaminophen (TYLENOL) tablet 650 mg (650 mg Oral Given 09/05/18 1153)    Initial Impression / Assessment and Plan / UC Course  I have reviewed the triage vital signs and the nursing notes.  Pertinent labs & imaging results that were available during my care of the patient were reviewed by me and considered in my medical decision making (see chart for details).     63 year old female with history of hypertension, currently taking lisinopril-HCTZ, presenting for shortness of breath.  Chest x-ray done in office, and reviewed by radiology, negative for pneumonia, positive for bibasilar predominant interstitial scarring.  Patient demonstrated inhaler use, unsatisfactory despite coaching.  Patient willing to trial nebulizer, given equipment in office today.  Patient given prednisone as this has provided relief of symptoms before.  Instructed to follow-up with PCP within 1 week.  Recommend considering trial of stopping lisinopril as this may be contributory/exacerbating cough and likely reactive airway. Final Clinical Impressions(s) / UC Diagnoses   Final diagnoses:  Bronchitis     Discharge Instructions     Take Tylenol for fever. Stay hydrated, drink at least 6 water bottles daily and avoid sugary beverages. Take steroid every morning, if you take this later in the evening and may keep you up throughout the night. Use nebulizer when experiencing difficulty breathing or wheezing.  Do not use your inhaler when he used a nebulizer. Follow-up with PCP about blood pressure  medication and to check in on  your breathing. Go to the emergency room if you develop severe shortness of breath that does not improve with nebulizer use, you develop painful swelling in your legs, or you begin to cough up blood.    ED Prescriptions    Medication Sig Dispense Auth. Provider   predniSONE (DELTASONE) 20 MG tablet Take 1 tablet (20 mg total) by mouth daily for 7 days. 7 tablet Hall-Potvin, [place], PA-C   albuterol (PROVENTIL) (2.5 MG/3ML) 0.083% nebulizer solution Take 3 mLs (2.5 mg total) by nebulization every 6 (six) hours as needed for wheezing or shortness of breath. 75 mL Hall-Potvin, [place], PA-C     Controlled Substance Prescriptions [place] Controlled Substance Registry consulted? Not Applicable   Shea Evans, [place] 09/05/18 1446

## 2018-09-05 NOTE — Discharge Instructions (Signed)
Take Tylenol for fever. Stay hydrated, drink at least 6 water bottles daily and avoid sugary beverages. Take steroid every morning, if you take this later in the evening and may keep you up throughout the night. Use nebulizer when experiencing difficulty breathing or wheezing.  Do not use your inhaler when he used a nebulizer. Follow-up with PCP about blood pressure medication and to check in on your breathing. Go to the emergency room if you develop severe shortness of breath that does not improve with nebulizer use, you develop painful swelling in your legs, or you begin to cough up blood.

## 2018-09-05 NOTE — ED Triage Notes (Signed)
Over the past 2 months has been treated for flu and pneumonia.  Currently sob started 1 1/2 weeks ago.  Patient has a dry cough.  Minimal runny nose. Patient reports feeling feverish.    No vomiting or diarrhea

## 2018-10-07 ENCOUNTER — Encounter: Payer: Self-pay | Admitting: Emergency Medicine

## 2018-10-07 ENCOUNTER — Ambulatory Visit: Payer: 59 | Admitting: Emergency Medicine

## 2018-10-07 ENCOUNTER — Other Ambulatory Visit: Payer: Self-pay

## 2018-10-07 DIAGNOSIS — R05 Cough: Secondary | ICD-10-CM | POA: Diagnosis not present

## 2018-10-07 DIAGNOSIS — R059 Cough, unspecified: Secondary | ICD-10-CM

## 2018-10-07 DIAGNOSIS — R0602 Shortness of breath: Secondary | ICD-10-CM

## 2018-10-07 DIAGNOSIS — R0600 Dyspnea, unspecified: Secondary | ICD-10-CM | POA: Insufficient documentation

## 2018-10-07 MED ORDER — PANTOPRAZOLE SODIUM 40 MG PO TBEC: 40.0000 mg | DELAYED_RELEASE_TABLET | Freq: Every day | ORAL | 1 refills | Status: DC

## 2018-10-07 NOTE — Assessment & Plan Note (Signed)
Agree with stopping your lisinopril as you have done Please start pantoprazole (Protonix) 40 mg once daily until next visit.  Take this medication either 1 hour before or 1 hour after eating. Continue Flonase 2 sprays each nostril once daily.

## 2018-10-07 NOTE — Addendum Note (Signed)
Addended by: Collier Salina on: 10/07/2018 04:22 PM   Modules accepted: Orders

## 2018-10-07 NOTE — Addendum Note (Signed)
Addended by: Suzzanne Cloud E on: 10/07/2018 04:26 PM   Modules accepted: Orders

## 2018-10-07 NOTE — Assessment & Plan Note (Signed)
Some of her complaints especially the gasping and difficulty with speaking may relate to upper airway instability and irritation.  Her exertional symptoms are different and I suspect there may be component of restrictive disease.  Her CT scan of the chest shows basilar interstitial changes, not a UIP pattern but concerning for interstitial lung disease.  I think we need to evaluate for aspiration, evaluate autoimmune labs.  We will perform pulmonary function testing.  We will perform full pulmonary function testing Based on the inflammatory changes seen on your CT scan of the chest we will perform lab work today We will perform a modified barium swallow evaluation with speech therapy Follow with Dr. Lamonte Sakai next available with full pulmonary function testing on the same day.

## 2018-10-07 NOTE — Patient Instructions (Addendum)
Agree with stopping your lisinopril as you have done We will perform full pulmonary function testing Based on the inflammatory changes seen on your CT scan of the chest we will perform lab work today We will perform a modified barium swallow evaluation with speech therapy Please start pantoprazole (Protonix) 40 mg once daily until next visit.  Take this medication either 1 hour before or 1 hour after eating. Continue Flonase 2 sprays each nostril once daily. Follow with Dr. Lamonte Sakai next available with full pulmonary function testing on the same day.

## 2018-10-07 NOTE — Progress Notes (Signed)
Subjective:    Patient ID: Ruth Brown, female    DOB: 1956-02-08, 63 y.o.   MRN: [id number]  HPI 63 year old never smoker with anxiety/depression, hypertension, hyperlipidemia.  She is referred today for evaluation of shortness of breath and cough.  She reports that she began to experience dyspnea with speaking - almost gasping. Seemed to begin after a URI at the beginning of 2020. She continued to have dyspnea with any exertion. She has been treated with abx x 2. Most noticeable when she is speaking. She can ambulate with her cane, does get SOB after 100 ft.   Of note she was on an ACE inhibitor for her blood pressure, just recently stopped in late May. Her cough is improved, but still happens. She has a globus sensation. It can wake her, worse when supine.  No overt GERD sx. Occasional nasal congestion, better with Flonase. She describes coughing with eating and drinking, some dysphagia  CT  Chest 3/21 reviewed, shows some bibasilar interstitial scar, no overt honeycomb but ? ILD.     Review of Systems  Constitutional: Negative.   HENT: Positive for congestion, rhinorrhea and sneezing.   Respiratory: Positive for cough and shortness of breath.   Cardiovascular: Negative.   Gastrointestinal: Negative.   Musculoskeletal: Negative.     Past Medical History:  Diagnosis Date  . Anxiety   . Arthritis   . Depression   . Gallstone   . HTN (hypertension)   . Hyperlipidemia      Family History  Problem Relation Age of Onset  . Depression Mother   . Anxiety disorder Mother   . Alcohol abuse Mother   . Depression Maternal Aunt   . Anxiety disorder Maternal Aunt   . COPD Father   . Heart disease Father   . Hypertension Sister   . Prostate cancer Brother      Social History   Socioeconomic History  . Marital status: Married    Spouse name: Not on file  . Number of children: Not on file  . Years of education: Not on file  . Highest education level: Not on file   Occupational History  . Occupation: mortgage collections  Social Needs  . Financial resource strain: Not on file  . Food insecurity    Worry: Not on file    Inability: Not on file  . Transportation needs    Medical: Not on file    Non-medical: Not on file  Tobacco Use  . Smoking status: Never Smoker  . Smokeless tobacco: Never Used  Substance and Sexual Activity  . Alcohol use: No    Alcohol/week: 0.0 standard drinks  . Drug use: No  . Sexual activity: Not on file  Lifestyle  . Physical activity    Days per week: Not on file    Minutes per session: Not on file  . Stress: Not on file  Relationships  . Social Musicianconnections    Talks on phone: Not on file    Gets together: Not on file    Attends religious service: Not on file    Active member of club or organization: Not on file    Attends meetings of clubs or organizations: Not on file    Relationship status: Not on file  . Intimate partner violence    Fear of current or ex partner: Not on file    Emotionally abused: Not on file    Physically abused: Not on file    Forced sexual activity: Not  on file  Other Topics Concern  . Not on file  Social History Narrative  . Not on file     No Known Allergies   Outpatient Medications Prior to Visit  Medication Sig Dispense Refill  . albuterol (PROVENTIL HFA;VENTOLIN HFA) 108 (90 Base) MCG/ACT inhaler Inhale 2 puffs into the lungs every 6 (six) hours as needed for wheezing or shortness of breath.    [name] albuterol (PROVENTIL) (2.5 MG/3ML) 0.083% nebulizer solution Take 3 mLs (2.5 mg total) by nebulization every 6 (six) hours as needed for wheezing or shortness of breath. 75 mL 12  . benzonatate (TESSALON) 100 MG capsule TAKE 1 CAPSULE BY MOUTH 3 TIMES A DAY AS NEEDED FOR COUGH UP TO 7 DAYS    . busPIRone (BUSPAR) 10 MG tablet Take 10 mg by mouth daily.    . citalopram (CELEXA) 20 MG tablet Take 1 tablet (20 mg total) by mouth daily. 90 tablet 0  . fluticasone (FLONASE) 50 MCG/ACT  nasal spray Place 2 sprays into both nostrils daily.    . hydrochlorothiazide (HYDRODIURIL) 12.5 MG tablet Take by mouth.    . naproxen (NAPROSYN) 375 MG tablet Take 375 mg by mouth daily as needed for pain.    . simvastatin (ZOCOR) 20 MG tablet Take 20 mg by mouth every other day.     . vitamin B-12 (CYANOCOBALAMIN) 100 MCG tablet Take 100 mcg by mouth daily.    [name] doxycycline (VIBRAMYCIN) 100 MG capsule Take 1 capsule (100 mg total) by mouth 2 (two) times daily. 20 capsule 0  . lisinopril-hydrochlorothiazide (PRINZIDE,ZESTORETIC) 10-12.5 MG per tablet Take 1 tablet by mouth daily.    [name] acetaminophen-codeine (TYLENOL #3) 300-30 MG tablet Take 1 tablet by mouth every 6 (six) hours as needed. for pain    . ibuprofen (ADVIL,MOTRIN) 800 MG tablet Take 800 mg by mouth every 6 (six) hours as needed for pain.    . Pseudoeph-Doxylamine-DM-APAP (NYQUIL PO) Take 1 Dose by mouth at bedtime as needed (cough and cold sx).    . mirtazapine (REMERON) 30 MG tablet Take 1 tablet (30 mg total) by mouth at bedtime. (Patient not taking: Reported on 06/29/2018) 90 tablet 0   No facility-administered medications prior to visit.          Objective:   Physical Exam Vitals:   10/07/18 1536  BP: 106/64  Pulse: (!) 104  Temp: 98.5 F (36.9 C)  SpO2: 99%  Weight: 147 lb (66.7 kg)  Height: 5' (1.524 m)   Gen: Pleasant, overwt woman, in no distress,  normal affect  ENT: No lesions,  mouth clear,  oropharynx clear, no postnasal drip  Neck: No JVD, some UA noise on a deep insp  Lungs: No use of accessory muscles, small breaths, no crackles or wheezing on normal respiration, no wheeze on forced expiration, some referred UA noise.   Cardiovascular: RRR, heart sounds normal, no murmur or gallops, no peripheral edema  Musculoskeletal: No deformities, no cyanosis or clubbing  Neuro: alert, awake, non focal  Skin: Warm, no lesions or rash       Assessment & Plan:  Dyspnea Some of her complaints  especially the gasping and difficulty with speaking may relate to upper airway instability and irritation.  Her exertional symptoms are different and I suspect there may be component of restrictive disease.  Her CT scan of the chest shows basilar interstitial changes, not a UIP pattern but concerning for interstitial lung disease.  I think we need to evaluate for  aspiration, evaluate autoimmune labs.  We will perform pulmonary function testing.  We will perform full pulmonary function testing Based on the inflammatory changes seen on your CT scan of the chest we will perform lab work today We will perform a modified barium swallow evaluation with speech therapy Follow with Dr. Delton CoombesByrum next available with full pulmonary function testing on the same day.   Cough Agree with stopping your lisinopril as you have done Please start pantoprazole (Protonix) 40 mg once daily until next visit.  Take this medication either 1 hour before or 1 hour after eating. Continue Flonase 2 sprays each nostril once daily.  Levy Pupaobert Byrum, MD, PhD 10/07/2018, 4:15 PM [facility] [phone] or if no answer [phone]

## 2018-10-08 ENCOUNTER — Other Ambulatory Visit (HOSPITAL_COMMUNITY): Payer: Self-pay | Admitting: *Deleted

## 2018-10-08 DIAGNOSIS — R1310 Dysphagia, unspecified: Secondary | ICD-10-CM

## 2018-10-08 LAB — RNP ANTIBODIES: ENA RNP Ab: <0.2 AI (ref 0.0–0.9)

## 2018-10-08 LAB — ANTI-NUCLEAR AB-TITER (ANA TITER): ANA Titer 1: >=1:1280 {titer} — AB

## 2018-10-08 LAB — SJOGRENS SYNDROME-A EXTRACTABLE NUCLEAR ANTIBODY
SSA (Ro) (ENA) Antibody, IgG: <1 AI
SSA (Ro) (ENA) Antibody, IgG: <1 AI

## 2018-10-08 LAB — ALDOLASE: Aldolase: 10 U/L — ABNORMAL HIGH (ref <=8.1)

## 2018-10-08 LAB — ANTI-SMITH ANTIBODY: ENA SM Ab Ser-aCnc: <1 AI

## 2018-10-08 LAB — ANTI-SCLERODERMA ANTIBODY: Scleroderma (Scl-70) (ENA) Antibody, IgG: <1 AI

## 2018-10-08 LAB — ANTI-DNA ANTIBODY, DOUBLE-STRANDED: ds DNA Ab: 1 IU/mL

## 2018-10-08 LAB — CYCLIC CITRUL PEPTIDE ANTIBODY, IGG: Cyclic Citrullin Peptide Ab: <16 UNITS

## 2018-10-08 LAB — RHEUMATOID FACTOR: Rheumatoid fact SerPl-aCnc: 15 IU/mL — ABNORMAL HIGH (ref <14)

## 2018-10-08 LAB — ANA: Anti Nuclear Antibody (ANA): POSITIVE — AB

## 2018-10-08 LAB — JO-1 ANTIBODY-IGG: Jo-1 Autoabs: <1 AI

## 2018-10-08 LAB — SJOGRENS SYNDROME-B EXTRACTABLE NUCLEAR ANTIBODY: SSB (La) (ENA) Antibody, IgG: <1 AI

## 2018-10-14 ENCOUNTER — Institutional Professional Consult (permissible substitution): Payer: 59 | Admitting: Pulmonary Disease

## 2018-10-15 ENCOUNTER — Ambulatory Visit (HOSPITAL_COMMUNITY)
Admission: RE | Admit: 2018-10-15 | Discharge: 2018-10-15 | Disposition: A | Discharge status: Home / Self Care | Payer: 59 | Source: Ambulatory Visit | Attending: Emergency Medicine | Admitting: Emergency Medicine

## 2018-10-15 ENCOUNTER — Other Ambulatory Visit: Payer: Self-pay

## 2018-10-15 DIAGNOSIS — R05 Cough: Secondary | ICD-10-CM | POA: Insufficient documentation

## 2018-10-15 DIAGNOSIS — R0602 Shortness of breath: Secondary | ICD-10-CM

## 2018-10-15 DIAGNOSIS — R1310 Dysphagia, unspecified: Secondary | ICD-10-CM | POA: Diagnosis not present

## 2018-10-15 DIAGNOSIS — R059 Cough, unspecified: Secondary | ICD-10-CM

## 2018-10-15 NOTE — Progress Notes (Signed)
Modified Barium Swallow Progress Note  Patient Details  Name: Ruth Brown MRN: [id number] Date of Birth: [birth year]  Today's Date: 10/15/2018  Modified Barium Swallow completed.  Full report located under Chart Review in the Imaging Section.  Brief recommendations include the following:  Clinical Impression  Pt presents with a mild oropharyngeal dysphagia characterized by fairly consistent premature spillage of thin liquids to the pyriform sinuses prior to swallow initiation. This is likely more of an incidental finding than any explanation for pts primary complaints and in an older patient, would be within normal limits for age. This spillage does not result in any airway invasion during the study, but could cause occasional sensed penetration events that would explain pts report of occasional coughing wth liquids. She reports she has to drink slowly and be careful. SLP provided addional instruction in a brief oral hold and forward head tilt to concsiously contain bolus prior to initiation with improvement in quantity and depth of spill. Provided written instruction to pt. Would not recommend any necessary f/u at this time unless pts condition significantly worsened in severity.    Swallow Evaluation Recommendations       SLP Diet Recommendations: Regular solids;Thin liquid   Liquid Administration via: Cup;Straw   Medication Administration: Whole meds with liquid   Supervision: Patient able to self feed   Compensations: Slow rate;Small sips/bites;Minimize environmental distractions(hold bolus in front of mouth, then swallow)               Ruth [place], MA [place] Pager [phone] Office [phone]  Ruth Brown 10/15/2018,11:40 AM

## 2018-10-30 ENCOUNTER — Other Ambulatory Visit: Payer: Self-pay | Admitting: Emergency Medicine

## 2018-10-30 DIAGNOSIS — R05 Cough: Secondary | ICD-10-CM

## 2018-10-30 DIAGNOSIS — R0602 Shortness of breath: Secondary | ICD-10-CM

## 2018-10-30 DIAGNOSIS — R059 Cough, unspecified: Secondary | ICD-10-CM

## 2018-11-23 ENCOUNTER — Other Ambulatory Visit: Payer: Self-pay | Admitting: Emergency Medicine

## 2018-11-23 DIAGNOSIS — R059 Cough, unspecified: Secondary | ICD-10-CM

## 2018-11-23 DIAGNOSIS — R0602 Shortness of breath: Secondary | ICD-10-CM

## 2018-11-23 DIAGNOSIS — R05 Cough: Secondary | ICD-10-CM

## 2019-01-21 ENCOUNTER — Other Ambulatory Visit: Payer: Self-pay

## 2019-01-21 DIAGNOSIS — Z20822 Contact with and (suspected) exposure to covid-19: Secondary | ICD-10-CM

## 2019-01-23 LAB — NOVEL CORONAVIRUS, NAA: SARS-CoV-2, NAA: NOT DETECTED

## 2019-04-26 ENCOUNTER — Encounter (HOSPITAL_COMMUNITY): Payer: Self-pay

## 2019-04-26 ENCOUNTER — Ambulatory Visit (HOSPITAL_COMMUNITY)
Admission: EM | Admit: 2019-04-26 | Discharge: 2019-04-26 | Disposition: A | Discharge status: Home / Self Care | Payer: Managed Care, Other (non HMO) | Attending: Family Medicine | Admitting: Family Medicine

## 2019-04-26 ENCOUNTER — Ambulatory Visit (INDEPENDENT_AMBULATORY_CARE_PROVIDER_SITE_OTHER): Payer: Managed Care, Other (non HMO)

## 2019-04-26 ENCOUNTER — Ambulatory Visit (HOSPITAL_COMMUNITY): Payer: Managed Care, Other (non HMO)

## 2019-04-26 ENCOUNTER — Other Ambulatory Visit: Payer: Self-pay

## 2019-04-26 DIAGNOSIS — Z8042 Family history of malignant neoplasm of prostate: Secondary | ICD-10-CM | POA: Diagnosis not present

## 2019-04-26 DIAGNOSIS — Z818 Family history of other mental and behavioral disorders: Secondary | ICD-10-CM | POA: Diagnosis not present

## 2019-04-26 DIAGNOSIS — J4520 Mild intermittent asthma, uncomplicated: Secondary | ICD-10-CM | POA: Diagnosis present

## 2019-04-26 DIAGNOSIS — I10 Essential (primary) hypertension: Secondary | ICD-10-CM | POA: Diagnosis not present

## 2019-04-26 DIAGNOSIS — R000 Tachycardia, unspecified: Secondary | ICD-10-CM | POA: Diagnosis not present

## 2019-04-26 DIAGNOSIS — Z20822 Contact with and (suspected) exposure to covid-19: Secondary | ICD-10-CM | POA: Diagnosis not present

## 2019-04-26 DIAGNOSIS — F419 Anxiety disorder, unspecified: Secondary | ICD-10-CM | POA: Insufficient documentation

## 2019-04-26 DIAGNOSIS — Z79899 Other long term (current) drug therapy: Secondary | ICD-10-CM | POA: Diagnosis not present

## 2019-04-26 DIAGNOSIS — E785 Hyperlipidemia, unspecified: Secondary | ICD-10-CM | POA: Insufficient documentation

## 2019-04-26 DIAGNOSIS — F4323 Adjustment disorder with mixed anxiety and depressed mood: Secondary | ICD-10-CM | POA: Insufficient documentation

## 2019-04-26 DIAGNOSIS — M1990 Unspecified osteoarthritis, unspecified site: Secondary | ICD-10-CM | POA: Insufficient documentation

## 2019-04-26 DIAGNOSIS — Z811 Family history of alcohol abuse and dependence: Secondary | ICD-10-CM | POA: Insufficient documentation

## 2019-04-26 DIAGNOSIS — R0789 Other chest pain: Secondary | ICD-10-CM

## 2019-04-26 DIAGNOSIS — R0602 Shortness of breath: Secondary | ICD-10-CM

## 2019-04-26 DIAGNOSIS — Z8249 Family history of ischemic heart disease and other diseases of the circulatory system: Secondary | ICD-10-CM | POA: Insufficient documentation

## 2019-04-26 DIAGNOSIS — R9431 Abnormal electrocardiogram [ECG] [EKG]: Secondary | ICD-10-CM | POA: Insufficient documentation

## 2019-04-26 MED ORDER — ALBUTEROL SULFATE HFA 108 (90 BASE) MCG/ACT IN AERS
2.0000 | INHALATION_SPRAY | Freq: Once | RESPIRATORY_TRACT | Status: AC
  Administered 2019-04-26: 2 via RESPIRATORY_TRACT

## 2019-04-26 MED ORDER — PREDNISONE 20 MG PO TABS: 40.0000 mg | ORAL_TABLET | Freq: Every day | ORAL | 0 refills | Status: AC

## 2019-04-26 MED ORDER — ALBUTEROL SULFATE HFA 108 (90 BASE) MCG/ACT IN AERS
INHALATION_SPRAY | RESPIRATORY_TRACT | Status: AC
  Filled 2019-04-26: qty 6.7

## 2019-04-26 MED ORDER — BENZONATATE 100 MG PO CAPS: 100.0000 mg | ORAL_CAPSULE | Freq: Three times a day (TID) | ORAL | 0 refills | Status: DC | PRN

## 2019-04-26 MED ORDER — AEROCHAMBER PLUS FLO-VU LARGE MISC
Status: AC
  Filled 2019-04-26: qty 1

## 2019-04-26 NOTE — Discharge Instructions (Addendum)
Your chest x-ray was negative for Pneumonia.  Your EKG was abnormal and indicates accelerated heart rate which was noted during a few prior medical visits. I recommend follow-up with cardiologist for full cardiac work-up as the cause of your accelerated heart rate and chest pain requires further evaluation.  If any symptoms worsen or are non-responsive to treatment, go immediately to the emergency department.

## 2019-04-26 NOTE — ED Provider Notes (Signed)
[facility]    CSN: [id number] Arrival date & time: 04/26/19  1358      History   Chief Complaint Chief Complaint  Patient presents with  . Shortness of Breath    HPI Ruth Brown is a 64 y.o. female.   HPI  Presents with a complaint of shortness of breath that started 4 days ago. Reports a history of chronic bronchitis and diagnosed with CAP last year around March.  She has had no known COVID-19 exposures. She has been using a home nebulizer machine with albuterol solution and has achieved temporary relief of shortness of breath or cough. She is ran out of her albuterol inhaler. Unknown if she has been febrile.  She endorses chills and body aches.  She has missed the last 4 days at work due to her symptoms. She has experienced hoarseness of voice. Cough with scant clear production.  She endorses palpitations as she is tachycardic here on arrival today.  She has no known history of cardiovascular disease.  She endorses left-sided chest pain which worsens with shortness of breath.  She also endorses mid back pain which she experiences with deep inhalations. Denies history of blood clots. Has had work-up by pulmonologist and advised she has chronic bronchitis with asthma. She is a non-smoker,however, lives with two smokers in her household.   Past Medical History:  Diagnosis Date  . Anxiety   . Arthritis   . Depression   . Gallstone   . HTN (hypertension)   . Hyperlipidemia     Patient Active Problem List   Diagnosis Date Noted  . Dyspnea 10/07/2018  . Cough 10/07/2018  . Colitis 10/22/2016  . Breast mass in female 10/22/2016  . Essential hypertension 10/22/2016  . Depression, major, recurrent, moderate (HCC) 09/07/2015    Class: Chronic  . Anxiety 12/16/2014  . Gonalgia 12/08/2014  . Adjustment disorder with mixed anxiety and depressed mood 10/26/2014    Class: Acute  . HLD (hyperlipidemia) 06/16/2014    Past Surgical History:  Procedure Laterality Date    . CESAREAN SECTION      OB History   No obstetric history on file.      Home Medications    Prior to Admission medications   Medication Sig Start Date End Date Taking? Authorizing Provider  acetaminophen-codeine (TYLENOL #3) 300-30 MG tablet Take 1 tablet by mouth every 6 (six) hours as needed. for pain 06/14/18   [provider]  albuterol (PROVENTIL HFA;VENTOLIN HFA) 108 (90 Base) MCG/ACT inhaler Inhale 2 puffs into the lungs every 6 (six) hours as needed for wheezing or shortness of breath. 06/07/18   [provider]  albuterol (PROVENTIL) (2.5 MG/3ML) 0.083% nebulizer solution Take 3 mLs (2.5 mg total) by nebulization every 6 (six) hours as needed for wheezing or shortness of breath. 09/05/18   Hall-Potvin, [place], PA-C  benzonatate (TESSALON) 100 MG capsule TAKE 1 CAPSULE BY MOUTH 3 TIMES A DAY AS NEEDED FOR COUGH UP TO 7 DAYS 10/01/18   [provider]  busPIRone (BUSPAR) 10 MG tablet Take 10 mg by mouth daily. 05/16/18   [provider]  citalopram (CELEXA) 20 MG tablet Take 1 tablet (20 mg total) by mouth daily. 07/06/16   Arfeen, Phillips Grout, MD  fluticasone (FLONASE) 50 MCG/ACT nasal spray Place 2 sprays into both nostrils daily.    [provider]  hydrochlorothiazide (HYDRODIURIL) 12.5 MG tablet Take by mouth. 09/09/18   [provider]  ibuprofen (ADVIL,MOTRIN) 800 MG tablet  Take 800 mg by mouth every 6 (six) hours as needed for pain. 05/24/18   [provider]  naproxen (NAPROSYN) 375 MG tablet Take 375 mg by mouth daily as needed for pain.    [provider]  pantoprazole (PROTONIX) 40 MG tablet TAKE 1 TABLET BY MOUTH EVERY DAY 11/26/18   Leslye Peer, MD  Pseudoeph-Doxylamine-DM-APAP (NYQUIL PO) Take 1 Dose by mouth at bedtime as needed (cough and cold sx).    [provider]  simvastatin (ZOCOR) 20 MG tablet Take 20 mg by mouth every other day.     [provider]  vitamin B-12 (CYANOCOBALAMIN)  100 MCG tablet Take 100 mcg by mouth daily.    [provider]    Family History Family History  Problem Relation Age of Onset  . Depression Mother   . Anxiety disorder Mother   . Alcohol abuse Mother   . Depression Maternal Aunt   . Anxiety disorder Maternal Aunt   . COPD Father   . Heart disease Father   . Hypertension Sister   . Prostate cancer Brother     Social History Social History   Tobacco Use  . Smoking status: Never Smoker  . Smokeless tobacco: Never Used  Substance Use Topics  . Alcohol use: No    Alcohol/week: 0.0 standard drinks  . Drug use: No     Allergies   Patient has no known allergies.  Review of Systems Review of Systems Pertinent negatives listed in HPI Physical Exam Triage Vital Signs ED Triage Vitals  Enc Vitals Group     BP 04/26/19 1528 107/73     Pulse Rate 04/26/19 1528 (!) 107     Resp 04/26/19 1528 18     Temp 04/26/19 1528 99.7 F (37.6 C)     Temp Source 04/26/19 1528 Oral     SpO2 04/26/19 1528 98 %     Weight 04/26/19 1526 147 lb (66.7 kg)     Height --      Head Circumference --      Peak Flow --      Pain Score 04/26/19 1526 3     Pain Loc --      Pain Edu? --      Excl. in GC? --    No data found.  Updated Vital Signs BP 107/73 (BP Location: Right Arm)   Pulse (!) 107   Temp 99.7 F (37.6 C) (Oral)   Resp 18   Wt 147 lb (66.7 kg)   LMP  (LMP Unknown)   SpO2 98%   BMI 28.71 kg/m   Visual Acuity Right Eye Distance:   Left Eye Distance:   Bilateral Distance:    Right Eye Near:   Left Eye Near:    Bilateral Near:     Physical Exam   UC Treatments / Results  Labs (all labs ordered are listed, but only abnormal results are displayed) Labs Reviewed  NOVEL CORONAVIRUS, NAA (HOSP ORDER, SEND-OUT TO REF LAB; TAT 18-24 HRS)    EKG   Radiology DG Chest 2 View  Result Date: 04/26/2019 CLINICAL DATA:  64 year old female with shortness of breath. EXAM: CHEST - 2 VIEW COMPARISON:  Chest  radiograph dated 09/05/2018. FINDINGS: Bibasilar linear scarring similar to prior radiograph. No new consolidation. There is no pleural effusion or pneumothorax. Stable cardiac silhouette. No acute osseous pathology. IMPRESSION: No focal consolidation. Bibasilar scarring similar to prior radiograph. Electronically Signed   By: Ceasar Mons.D.  On: 04/26/2019 17:21    Procedures Procedures (including critical care time)  Medications Ordered in UC Medications - No data to display  Initial Impression / Assessment and Plan / UC Course  I have reviewed the triage vital signs and the nursing notes.  Pertinent labs & imaging results that were available during my care of the patient were reviewed by me and considered in my medical decision making (see chart for details).   Patient presents with 4 days of SOB with cough with production, body aches, low-grade fever. Symptoms are consistent with both bronchitis, however, can't rule out an active COVID-19 infection. COVID-19 testing pending. Chest x-ray negative for pneumonia.  Treating for acute bronchitis today. Prednisone 40 mg x 5 days and prescribed albuterol  Inhaler. Antibiotics are not warranted given suspected viral etiology of symptoms.  Expressed to patient my concern regarding persistent sinus tachycardia which is noted on prior encounter notes. EKG confirmed Sinus Tachycardia with left atrial enlargement. Patient educated on the importance of immediate follow-up with a cardiologist to have a complete work-up. Red flags discussed warranting immediate follow-up at the emergency department.  Patient verbalized understanding and agreement with plan. Final Clinical Impressions(s) / UC Diagnoses   Final diagnoses:  SOB (shortness of breath)  Mild intermittent reactive airway disease without complication  Atypical chest pain  Sinus tachycardia by electrocardiogram     Discharge Instructions     Your chest x-ray was negative for  Pneumonia.  Your EKG was abnormal and indicates accelerated heart rate which was noted during a few prior medical visits. I recommend follow-up with cardiologist for full cardiac work-up as the cause of your accelerated heart rate and chest pain requires further evaluation.  If any symptoms worsen or are non-responsive to treatment, go immediately to the emergency department.    ED Prescriptions    Medication Sig Dispense Auth. Provider   predniSONE (DELTASONE) 20 MG tablet Take 2 tablets (40 mg total) by mouth daily with breakfast for 5 days. 10 tablet Scot Jun, FNP   benzonatate (TESSALON) 100 MG capsule Take 1-2 capsules (100-200 mg total) by mouth 3 (three) times daily as needed for cough. 40 capsule Scot Jun, FNP     PDMP not reviewed this encounter.   Scot Jun, Phil Campbell 04/28/19 [phone]

## 2019-04-26 NOTE — ED Triage Notes (Signed)
Pt states she has a SOB this started on Tuesday. Pt states she thinks she has bronchitis . Pt states she has a cough.

## 2019-04-28 LAB — NOVEL CORONAVIRUS, NAA (HOSP ORDER, SEND-OUT TO REF LAB; TAT 18-24 HRS): SARS-CoV-2, NAA: NOT DETECTED

## 2019-05-05 ENCOUNTER — Encounter: Payer: Self-pay | Admitting: Cardiology

## 2019-05-05 ENCOUNTER — Other Ambulatory Visit: Payer: Self-pay

## 2019-05-05 ENCOUNTER — Ambulatory Visit (INDEPENDENT_AMBULATORY_CARE_PROVIDER_SITE_OTHER): Payer: Managed Care, Other (non HMO) | Admitting: Cardiology

## 2019-05-05 VITALS — BP 121/79 | HR 88 | Temp 96.4°F | Ht 60.0 in | Wt 155.4 lb

## 2019-05-05 DIAGNOSIS — R072 Precordial pain: Secondary | ICD-10-CM | POA: Diagnosis not present

## 2019-05-05 DIAGNOSIS — Z7189 Other specified counseling: Secondary | ICD-10-CM

## 2019-05-05 DIAGNOSIS — I10 Essential (primary) hypertension: Secondary | ICD-10-CM | POA: Diagnosis not present

## 2019-05-05 DIAGNOSIS — Z7182 Exercise counseling: Secondary | ICD-10-CM

## 2019-05-05 DIAGNOSIS — R0602 Shortness of breath: Secondary | ICD-10-CM

## 2019-05-05 DIAGNOSIS — Z01812 Encounter for preprocedural laboratory examination: Secondary | ICD-10-CM | POA: Diagnosis not present

## 2019-05-05 LAB — BASIC METABOLIC PANEL
BUN/Creatinine Ratio: 29 — ABNORMAL HIGH (ref 12–28)
BUN: 16 mg/dL (ref 8–27)
CO2: 28 mmol/L (ref 20–29)
Calcium: 9.4 mg/dL (ref 8.7–10.3)
Chloride: 98 mmol/L (ref 96–106)
Creatinine, Ser: 0.56 mg/dL — ABNORMAL LOW (ref 0.57–1.00)
GFR calc Af Amer: 116 mL/min/{1.73_m2} (ref >59)
GFR calc non Af Amer: 101 mL/min/{1.73_m2} (ref >59)
Glucose: 102 mg/dL — ABNORMAL HIGH (ref 65–99)
Potassium: 4 mmol/L (ref 3.5–5.2)
Sodium: 142 mmol/L (ref 134–144)

## 2019-05-05 MED ORDER — METOPROLOL TARTRATE 50 MG PO TABS: ORAL_TABLET | ORAL | 0 refills | Status: DC

## 2019-05-05 NOTE — Patient Instructions (Signed)
Medication Instructions:  Your Physician recommend you continue on your current medication as directed.    *If you need a refill on your cardiac medications before your next appointment, please call your pharmacy*  Lab Work: Your physician recommends that you return for lab work in today ( BMP).  If you have labs (blood work) drawn today and your tests are completely normal, you will receive your results only by: [name] MyChart Message (if you have MyChart) OR . A paper copy in the mail If you have any lab test that is abnormal or we need to change your treatment, we will call you to review the results.  Testing/Procedures: Your physician has requested that you have an echocardiogram. Echocardiography is a painless test that uses sound waves to create images of your heart. It provides your doctor with information about the size and shape of your heart and how well your heart's chambers and valves are working. This procedure takes approximately one hour. There are no restrictions for this procedure. [place] [address]  Cardiac CT Angiography (CTA), is a special type of CT scan that uses a computer to produce multi-dimensional views of major blood vessels throughout the body. In CT angiography, a contrast material is injected through an IV to help visualize the blood vessels [facility]  Follow-Up: At [facility], you and your health needs are our priority.  As part of our continuing mission to provide you with exceptional heart care, we have created designated Provider Care Teams.  These Care Teams include your primary Cardiologist (physician) and Advanced Practice Providers (APPs -  Physician Assistants and Nurse Practitioners) who all work together to provide you with the care you need, when you need it.  Your next appointment:   As needed   The format for your next appointment:   Either In Person or Virtual  Provider:   Buford Dresser, MD  Your cardiac CT  will be scheduled at one of the below locations:   [facility] [address] [place], [place] [address] [phone]  If scheduled at [facility], please arrive at the [facility] main entrance of [facility] 30-45 minutes prior to test start time. Proceed to the [facility] Radiology Department (first floor) to check-in and test prep.  Please follow these instructions carefully (unless otherwise directed):  On the Night Before the Test: . Be sure to Drink plenty of water. . Do not consume any caffeinated/decaffeinated beverages or chocolate 12 hours prior to your test. . Do not take any antihistamines 12 hours prior to your test.  On the Day of the Test: . Drink plenty of water. Do not drink any water within one hour of the test. . Do not eat any food 4 hours prior to the test. . You may take your regular medications prior to the test.  . Take metoprolol 50 mg (Lopressor) two hours prior to test. . HOLD Hydrochlorothiazide morning of the test. . FEMALES- please wear underwire-free bra if available       After the Test: . Drink plenty of water. . After receiving IV contrast, you may experience a mild flushed feeling. This is normal. . On occasion, you may experience a mild rash up to 24 hours after the test. This is not dangerous. If this occurs, you can take Benadryl 25 mg and increase your fluid intake. . If you experience trouble breathing, this can be serious. If it is severe call 911 IMMEDIATELY.  If it is mild, please call our office. . If you take any of these medications: Glipizide/Metformin, Avandament, Glucavance, please do not take 48 hours after completing test unless otherwise instructed.   Once we have confirmed authorization from your insurance company, we will call you to set up a date and time for your test.   For non-scheduling related questions, please contact the cardiac imaging nurse navigator should you have any  questions/concerns: Rockwell Alexandria, RN Navigator Cardiac Imaging [facility] [phone] Office

## 2019-05-05 NOTE — Progress Notes (Signed)
Cardiology Office Note:    Date:  05/05/2019   ID:  Ruth Brown, DOB 11/19/1955, MRN [id number]  PCP:  [name], MD  Cardiologist:  Buford Dresser, MD  Referring MD: [name], MD   CC: New patient consultation for chest tightness  History of Present Illness:    Ruth Brown is a 64 y.o. female with a hx of chronic bronchitis and asthma, hypertension, hyperlipidemia who is seen as a new consult at the request of [name], MD for the evaluation and management of chest tightness.  Reviewed ER note from 04/26/19. Endorsed 4 day history of shortness of breath, improved with albuterol. Associated with left sided chest pain, worse when her breathing is bad. She was treated for acute bronchitis. She was noted to have sinus tachycardia and recommended to have follow up with cardiology. Covid negative.  Chest discomfort: -Initial onset: started last March or so, when still in person at work, noted more shortness of breath. Noted that she got winded more easily. Transitioned to working from home, symptoms continued intermittently. About three months ago noted shortness of breath even just with talking on the phone for her job. Most recent episode was shortness of breath and cough, brought her to the ER.  -Quality: mild tightness, worse with shortness of breath -Frequency: paying more attention, happening 1-2 times/day -Duration: 10-15 minutes -Associated symptoms: shortness of breath -Aggravating/alleviating factors: nonexertional, worse when she feels anxious -Prior cardiac history: none -Prior ECG: NSR -Prior workup: none -Prior treatment: none -Alcohol: rarely, at the holidays -Tobacco: she does not smoke but does live with husband who smokes outside -Comorbidities: hypertension, hyperlipidemia, chronic bronchitis -Exercise level: walks routinely, does housework, can climb stairs, etc. Sometimes needs cane -Cardiac ROS: no PND, no orthopnea, no LE edema, no  syncope -Family history: father had COPD, heart disease  Past Medical History:  Diagnosis Date  . Anxiety   . Arthritis   . Depression   . Gallstone   . HTN (hypertension)   . Hyperlipidemia     Past Surgical History:  Procedure Laterality Date  . CESAREAN SECTION      Current Medications: Current Outpatient Medications on File Prior to Visit  Medication Sig  . acetaminophen-codeine (TYLENOL #3) 300-30 MG tablet Take 1 tablet by mouth every 6 (six) hours as needed. for pain  . albuterol (PROVENTIL HFA;VENTOLIN HFA) 108 (90 Base) MCG/ACT inhaler Inhale 2 puffs into the lungs every 6 (six) hours as needed for wheezing or shortness of breath.  [name] albuterol (PROVENTIL) (2.5 MG/3ML) 0.083% nebulizer solution Take 3 mLs (2.5 mg total) by nebulization every 6 (six) hours as needed for wheezing or shortness of breath.  . benzonatate (TESSALON) 100 MG capsule Take 1-2 capsules (100-200 mg total) by mouth 3 (three) times daily as needed for cough.  . busPIRone (BUSPAR) 10 MG tablet Take 10 mg by mouth daily.  . citalopram (CELEXA) 20 MG tablet Take 1 tablet (20 mg total) by mouth daily.  . fluticasone (FLONASE) 50 MCG/ACT nasal spray Place 2 sprays into both nostrils daily.  . hydrochlorothiazide (HYDRODIURIL) 12.5 MG tablet Take by mouth.  [name] ibuprofen (ADVIL,MOTRIN) 800 MG tablet Take 800 mg by mouth every 6 (six) hours as needed for pain.  . naproxen (NAPROSYN) 375 MG tablet Take 375 mg by mouth daily as needed for pain.  . pantoprazole (PROTONIX) 40 MG tablet TAKE 1 TABLET BY MOUTH EVERY DAY  . Pseudoeph-Doxylamine-DM-APAP (NYQUIL PO) Take 1 Dose by mouth at  bedtime as needed (cough and cold sx).  . simvastatin (ZOCOR) 20 MG tablet Take 20 mg by mouth every other day.   . vitamin B-12 (CYANOCOBALAMIN) 100 MCG tablet Take 100 mcg by mouth daily.   No current facility-administered medications on file prior to visit.     Allergies:   Patient has no known allergies.   Social History    Tobacco Use  . Smoking status: Never Smoker  . Smokeless tobacco: Never Used  Substance Use Topics  . Alcohol use: No    Alcohol/week: 0.0 standard drinks  . Drug use: No    Family History: family history includes Alcohol abuse in her mother; Anxiety disorder in her maternal aunt and mother; COPD in her father; Depression in her maternal aunt and mother; Heart disease in her father; Hypertension in her sister; Prostate cancer in her brother.  ROS:   Please see the history of present illness.  Additional pertinent ROS: Constitutional: Negative for chills, fever, night sweats, unintentional weight loss  HENT: Negative for ear pain and hearing loss.   Eyes: Negative for loss of vision and eye pain.  Respiratory: Negative for cough, sputum, wheezing.   Cardiovascular: See HPI. Gastrointestinal: Negative for abdominal pain, melena, and hematochezia.  Genitourinary: Negative for dysuria and hematuria.  Musculoskeletal: Negative for falls and myalgias.  Skin: Negative for itching and rash.  Neurological: Negative for focal weakness, focal sensory changes and loss of consciousness.  Endo/Heme/Allergies: Does not bruise/bleed easily.     EKGs/Labs/Other Studies Reviewed:    The following studies were reviewed today: No prior cardiac studies available  EKG:  EKG is personally reviewed.  The ekg ordered today demonstrates NSR  Recent Labs: 06/29/2018: ALT 11; B Natriuretic Peptide 8.1; BUN <5; Creatinine, Ser 0.59; Hemoglobin 11.8; Platelets 280; Potassium 3.3; Sodium 136  Recent Lipid Panel No results found for: CHOL, TRIG, HDL, CHOLHDL, VLDL, LDLCALC, LDLDIRECT  Physical Exam:    VS:  BP 121/79   Pulse 88   Temp (!) 96.4 F (35.8 C)   Ht 5' (1.524 m)   Wt 155 lb 6.4 oz (70.5 kg)   LMP  (LMP Unknown)   SpO2 96%   BMI 30.35 kg/m     Wt Readings from Last 3 Encounters:  05/05/19 155 lb 6.4 oz (70.5 kg)  04/26/19 147 lb (66.7 kg)  10/07/18 147 lb (66.7 kg)    GEN: Well  nourished, well developed in no acute distress HEENT: Normal, moist mucous membranes NECK: No JVD CARDIAC: regular rhythm, normal S1 and S2, no rubs or gallops. No murmurs. VASCULAR: Radial and DP pulses 2+ bilaterally. No carotid bruits RESPIRATORY:  Clear to auscultation without rales, wheezing or rhonchi  ABDOMEN: Soft, non-tender, non-distended MUSCULOSKELETAL:  Ambulates independently SKIN: Warm and dry, no edema NEUROLOGIC:  Alert and oriented x 3. No focal neuro deficits noted. PSYCHIATRIC:  Normal affect    ASSESSMENT:    1. SOB (shortness of breath)   2. Precordial pain   3. Pre-procedure lab exam   4. Essential hypertension   5. Cardiac risk counseling   6. Counseling on health promotion and disease prevention    PLAN:    Shortness of breath, chest discomfort: both typical and atypical symptoms. Has cardiac risk factors, need to exclude cardiac etiology -echocardiogram --discussed treadmill stress, nuclear stress/lexiscan, and CT coronary angiography. Discussed pros and cons of each, including but not limited to false positive/false negative risk, radiation risk, and risk of IV contrast dye. Based on shared decision  making, decision was made to pursue CT coronary angiography. -will give one time dose of metoprolol 2 hours prior to scheduled test -counseled on need to get BMET prior to test -counseled on use of sublingual nitroglycerin and its importance to a good test  Hypertension: well controlled today -continue HCTZ  Hyperlipidemia: LDL within goal if no CAD, but if CAD on CT, will need to intensify -continue simvastatin  -lipid numbers as below  Cardiac risk counseling and prevention recommendations: -recommend heart healthy/Mediterranean diet, with whole grains, fruits, vegetable, fish, lean meats, nuts, and olive oil. Limit salt. -recommend moderate walking, 3-5 times/week for 30-50 minutes each session. Aim for at least 150 minutes.week. Goal should be pace of  3 miles/hours, or walking 1.5 miles in 30 minutes -recommend avoidance of tobacco products. Avoid excess alcohol. -Additional risk factor control:  -Diabetes risk: A1c is not available, but told she will be screened as her mom was diagnosed with diabetes. Was told her sugar level was elevated with prednisone  -Lipids: per patient, just had labs last week. Told her cholesterol was improved. Tchol 161, TG 151, HDL 38, LDL 96.  -Blood pressure control: as above  -Weight: BMI 30 -ASCVD risk score: 10 yr risk 6.4%    Plan for follow up: TBD based on results of testing  Medication Adjustments/Labs and Tests Ordered: Current medicines are reviewed at length with the patient today.  Concerns regarding medicines are outlined above.  Orders Placed This Encounter  Procedures  . CT CORONARY MORPH W/CTA COR W/SCORE W/CA W/CM &/OR WO/CM  . CT CORONARY FRACTIONAL FLOW RESERVE DATA PREP  . CT CORONARY FRACTIONAL FLOW RESERVE FLUID ANALYSIS  . Basic metabolic panel  . EKG 12-Lead  . ECHOCARDIOGRAM COMPLETE   Meds ordered this encounter  Medications  . DISCONTD: metoprolol tartrate (LOPRESSOR) 50 MG tablet    Sig: TAKE 1 TABLET 2 HR PRIOR TO CARDIAC PROCEDURE    Dispense:  1 tablet    Refill:  0    Patient Instructions  Medication Instructions:  Your Physician recommend you continue on your current medication as directed.    *If you need a refill on your cardiac medications before your next appointment, please call your pharmacy*  Lab Work: Your physician recommends that you return for lab work in today ( BMP).  If you have labs (blood work) drawn today and your tests are completely normal, you will receive your results only by: [name] MyChart Message (if you have MyChart) OR . A paper copy in the mail If you have any lab test that is abnormal or we need to change your treatment, we will call you to review the results.  Testing/Procedures: Your physician has requested that you have an  echocardiogram. Echocardiography is a painless test that uses sound waves to create images of your heart. It provides your doctor with information about the size and shape of your heart and how well your heart's chambers and valves are working. This procedure takes approximately one hour. There are no restrictions for this procedure. [address]  Cardiac CT Angiography (CTA), is a special type of CT scan that uses a computer to produce multi-dimensional views of major blood vessels throughout the body. In CT angiography, a contrast material is injected through an IV to help visualize the blood vessels [facility]  Follow-Up: At [facility], you and your health needs are our priority.  As part of our continuing mission to provide you with exceptional heart  care, we have created designated Provider Care Teams.  These Care Teams include your primary Cardiologist (physician) and Advanced Practice Providers (APPs -  Physician Assistants and Nurse Practitioners) who all work together to provide you with the care you need, when you need it.  Your next appointment:   As needed   The format for your next appointment:   Either In Person or Virtual  Provider:   Jodelle Red, MD  Your cardiac CT will be scheduled at one of the below locations:   [facility] [address] [place], [place] [address] [phone]  If scheduled at [facility], please arrive at the [facility] main entrance of [facility] 30-45 minutes prior to test start time. Proceed to the [facility] Radiology Department (first floor) to check-in and test prep.  Please follow these instructions carefully (unless otherwise directed):  On the Night Before the Test: . Be sure to Drink plenty of water. . Do not consume any caffeinated/decaffeinated beverages or chocolate 12 hours prior to your test. . Do not take any antihistamines 12 hours prior to your  test.  On the Day of the Test: . Drink plenty of water. Do not drink any water within one hour of the test. . Do not eat any food 4 hours prior to the test. . You may take your regular medications prior to the test.  . Take metoprolol 50 mg (Lopressor) two hours prior to test. . HOLD Hydrochlorothiazide morning of the test. . FEMALES- please wear underwire-free bra if available       After the Test: . Drink plenty of water. . After receiving IV contrast, you may experience a mild flushed feeling. This is normal. . On occasion, you may experience a mild rash up to 24 hours after the test. This is not dangerous. If this occurs, you can take Benadryl 25 mg and increase your fluid intake. . If you experience trouble breathing, this can be serious. If it is severe call 911 IMMEDIATELY. If it is mild, please call our office. . If you take any of these medications: Glipizide/Metformin, Avandament, Glucavance, please do not take 48 hours after completing test unless otherwise instructed.   Once we have confirmed authorization from your insurance company, we will call you to set up a date and time for your test.   For non-scheduling related questions, please contact the cardiac imaging nurse navigator should you have any questions/concerns: Rockwell Alexandria, RN Navigator Cardiac Imaging [facility] [phone] Office      Signed, Jodelle Red, MD PhD 05/05/2019     [facility]

## 2019-05-15 ENCOUNTER — Ambulatory Visit (HOSPITAL_COMMUNITY): Payer: Managed Care, Other (non HMO) | Attending: Cardiology

## 2019-05-15 ENCOUNTER — Other Ambulatory Visit: Payer: Self-pay

## 2019-05-15 ENCOUNTER — Telehealth: Payer: Self-pay | Admitting: Cardiology

## 2019-05-15 DIAGNOSIS — R0602 Shortness of breath: Secondary | ICD-10-CM | POA: Diagnosis present

## 2019-05-15 MED ORDER — METOPROLOL TARTRATE 50 MG PO TABS: ORAL_TABLET | ORAL | 0 refills | Status: DC

## 2019-05-15 NOTE — Telephone Encounter (Signed)
Spoke with pt and informed her that refill for 1 tab of Lopressor 50 mg has been sent to her preferred pharmacy for pick up. Pt verbalized understanding.   Pt states her PCP provided her with letter/form to excuse her from work between 1/19-1/29. Pt is set to return to work on 2/8, but states she should be able to return sooner and would like a letter from Dr. Cristal Deer stating this. Pt states that Dr. Cristal Deer told he that her echo and cardiac CT shouldn't prevent her from returning to work. Pt states at her place of work she is allowed "Intermittent Leave" for 'a couple of hours' and believes she should be able to leave work to have cardiac CT done and then go back to work. She states the letter may be faxed to [facility] at [phone] and Plan # is [phone]

## 2019-05-15 NOTE — Telephone Encounter (Signed)
Pt c/o medication issue:  1. Name of Medication: metoprolol tartrate (LOPRESSOR) 50 MG tablet  2. How are you currently taking this medication (dosage and times per day)? Once before CT  3. Are you having a reaction (difficulty breathing--STAT)? no  4. What is your medication issue? Patient states she took the pill before her echo today instead of her CT. She needs a new prescription sent to [facility].

## 2019-05-19 NOTE — Telephone Encounter (Signed)
Received disability forms from [employer]. Paperwork completed and returned to medical records.

## 2019-05-19 NOTE — Telephone Encounter (Signed)
Left message to call back  

## 2019-05-20 ENCOUNTER — Telehealth: Payer: Self-pay | Admitting: Cardiology

## 2019-05-20 NOTE — Telephone Encounter (Signed)
Pt updated and verbalized understanding.  

## 2019-05-20 NOTE — Telephone Encounter (Signed)
Follow Up:      Pt is returning your call from yesterday, concerning her Echo results. 

## 2019-05-29 ENCOUNTER — Ambulatory Visit (INDEPENDENT_AMBULATORY_CARE_PROVIDER_SITE_OTHER): Payer: Managed Care, Other (non HMO) | Admitting: Emergency Medicine

## 2019-05-29 ENCOUNTER — Encounter: Payer: Self-pay | Admitting: Emergency Medicine

## 2019-05-29 DIAGNOSIS — J849 Interstitial pulmonary disease, unspecified: Secondary | ICD-10-CM | POA: Diagnosis not present

## 2019-05-29 NOTE — Progress Notes (Signed)
Virtual Visit via Video Note  I connected with Judithann Sauger on 05/29/19 at  9:45 AM EST by a video enabled telemedicine application and verified that I am speaking with the correct person using two identifiers.  Location: Patient: Home Provider: Home Office   I discussed the limitations of evaluation and management by telemedicine and the availability of in person appointments. The patient expressed understanding and agreed to proceed.  History of Present Illness: 64 year old woman who follows up for dyspnea. She is followed by Elpidio Anis with rheumatology, apparently with a diagnosis of RA, is currently on prednisone.  Her SOB was characterized by some gasping, difficulty speaking that may relate to upper airway instability.  She also has some mild basilar interstitial changes (not UIP pattern) on CT chest.  Swallowing evaluation as below.   Observations/Objective: SLP evaluation modified barium swallow done on 10/15/2018 identified aspiration risk, some spillage but not overt aspiration.  Recommended thin liquids, regular solids, small bites, slow rate etc. Her autoimmune work-up was significant for a significantly positive ANA, Elevated aldolase, RF, otherwise negative.  Last visit her ACE inhibitor has been stopped due to cough.  She still had a globus sensation.  She reports today She is on protonix, flonase. She is using albuterol about 2x a week.  Her dyspnea is better.  No longer with gasping.  Her cough is better as well  Assessment and Plan: Dyspnea that is improved, question whether much of it was upper airway in nature.  All the same she does now have documented interstitial lung disease with an elevated ANA, possible diagnosis of RA. -She needs a repeat high-resolution CT scan of the chest in March to compare with last year.  We will then follow-up and review.  Depending on stability, progression will consider referral to ILD clinic.  Follow Up Instructions: High-res CT scan  of the chest Follow-up in March after the scan to review the results together.   I discussed the assessment and treatment plan with the patient. The patient was provided an opportunity to ask questions and all were answered. The patient agreed with the plan and demonstrated an understanding of the instructions.   The patient was advised to call back or seek an in-person evaluation if the symptoms worsen or if the condition fails to improve as anticipated.  I provided 15 minutes of non-face-to-face time during this encounter.   Leslye Peer, MD

## 2019-06-09 ENCOUNTER — Encounter (HOSPITAL_COMMUNITY): Payer: Self-pay

## 2019-06-10 ENCOUNTER — Telehealth (HOSPITAL_COMMUNITY): Payer: Self-pay | Admitting: Emergency Medicine

## 2019-06-10 NOTE — Telephone Encounter (Signed)
Left message on voicemail with name and callback number Timothea Bodenheimer RN Navigator Cardiac Imaging [facility] [phone] Office [phone] Cell  

## 2019-06-11 ENCOUNTER — Telehealth (HOSPITAL_COMMUNITY): Payer: Self-pay | Admitting: Emergency Medicine

## 2019-06-11 ENCOUNTER — Encounter (HOSPITAL_COMMUNITY): Payer: Self-pay | Admitting: *Deleted

## 2019-06-11 ENCOUNTER — Other Ambulatory Visit: Payer: Self-pay

## 2019-06-11 ENCOUNTER — Ambulatory Visit (HOSPITAL_COMMUNITY)
Admission: RE | Admit: 2019-06-11 | Discharge: 2019-06-11 | Disposition: A | Discharge status: Home / Self Care | Payer: 59 | Source: Ambulatory Visit | Attending: Cardiology | Admitting: Cardiology

## 2019-06-11 ENCOUNTER — Encounter (HOSPITAL_COMMUNITY): Payer: Self-pay

## 2019-06-11 DIAGNOSIS — R072 Precordial pain: Secondary | ICD-10-CM | POA: Diagnosis present

## 2019-06-11 MED ORDER — METOPROLOL TARTRATE 5 MG/5ML IV SOLN
5.0000 mg | INTRAVENOUS | Status: DC | PRN
  Administered 2019-06-11 (×2): 5 mg via INTRAVENOUS

## 2019-06-11 MED ORDER — METOPROLOL TARTRATE 5 MG/5ML IV SOLN
INTRAVENOUS | Status: AC
  Filled 2019-06-11: qty 15

## 2019-06-11 MED ORDER — DILTIAZEM HCL 25 MG/5ML IV SOLN
15.0000 mg | Freq: Once | INTRAVENOUS | Status: AC
  Administered 2019-06-11: 15 mg via INTRAVENOUS
  Filled 2019-06-11: qty 5

## 2019-06-11 MED ORDER — DILTIAZEM HCL 25 MG/5ML IV SOLN
INTRAVENOUS | Status: AC
  Filled 2019-06-11: qty 5

## 2019-06-11 MED ORDER — NITROGLYCERIN 0.4 MG SL SUBL: 0.8000 mg | SUBLINGUAL_TABLET | Freq: Once | SUBLINGUAL | Status: DC

## 2019-06-11 NOTE — Telephone Encounter (Signed)
Pt calling to confirm when to take PO metoprolol for todays CCTA (10:30a). Pt verbalized understanding.  Also reiterated location is the [facility], not [facility] office  Rockwell Alexandria RN Navigator Cardiac Imaging [facility] [phone] Office  [phone] Cell

## 2019-06-11 NOTE — Progress Notes (Signed)
Unable to get pt HR to acceptable rate for ct scan today. Dr Cristal Deer and Daisy Blossom aware. Pt verbalized understanding of rescheduling and will await instructions from Daisy Blossom for next appointment. DC IV, pt discharged to front entrance via wheelchair. VSS.

## 2019-06-12 ENCOUNTER — Other Ambulatory Visit: Payer: Self-pay

## 2019-06-12 ENCOUNTER — Telehealth (HOSPITAL_COMMUNITY): Payer: Self-pay | Admitting: Emergency Medicine

## 2019-06-12 MED ORDER — METOPROLOL TARTRATE 100 MG PO TABS: ORAL_TABLET | ORAL | 0 refills | Status: DC

## 2019-06-12 NOTE — Telephone Encounter (Signed)
Pt called and left VM on my answering machine asking to talk about yesterdays CCTA appt  I called patient back and she was concerned that were trying to lower her BP for the test. I clarified with the patient that we were lowering her HR for the test however we were still unable to adequately control the HR for testing purposes.  I explained that our goal is to have her HR 55-65 bpm, but want her BP to remain normal (120/80-ish). Pt verbalized understanding.   She said shes r/s for 07/02/19 at 11:30 and I told her that we might need to give her 100mg  metoprolol tartrate prior to scan instead of the 50mg  from the last attempt. Pt verbalized understanding. Appreciated my explainations. Told her to call back with further questions if any.  RN Navigator Cardiac Imaging [facility] [phone] Office  [phone] Cell

## 2019-06-13 ENCOUNTER — Other Ambulatory Visit: Payer: Self-pay | Admitting: Family Medicine

## 2019-06-21 ENCOUNTER — Other Ambulatory Visit: Payer: Self-pay | Admitting: Family Medicine

## 2019-06-25 ENCOUNTER — Ambulatory Visit (INDEPENDENT_AMBULATORY_CARE_PROVIDER_SITE_OTHER)
Admission: RE | Admit: 2019-06-25 | Discharge: 2019-06-25 | Disposition: A | Discharge status: Home / Self Care | Payer: 59 | Source: Ambulatory Visit | Attending: Emergency Medicine | Admitting: Emergency Medicine

## 2019-06-25 ENCOUNTER — Other Ambulatory Visit: Payer: Self-pay

## 2019-06-25 DIAGNOSIS — J849 Interstitial pulmonary disease, unspecified: Secondary | ICD-10-CM

## 2019-06-26 ENCOUNTER — Encounter: Payer: Self-pay | Admitting: Emergency Medicine

## 2019-06-26 ENCOUNTER — Ambulatory Visit: Payer: Managed Care, Other (non HMO) | Admitting: Emergency Medicine

## 2019-06-26 DIAGNOSIS — R05 Cough: Secondary | ICD-10-CM | POA: Diagnosis not present

## 2019-06-26 DIAGNOSIS — J849 Interstitial pulmonary disease, unspecified: Secondary | ICD-10-CM | POA: Diagnosis not present

## 2019-06-26 DIAGNOSIS — R059 Cough, unspecified: Secondary | ICD-10-CM

## 2019-06-26 MED ORDER — BENZONATATE 100 MG PO CAPS: 100.0000 mg | ORAL_CAPSULE | Freq: Three times a day (TID) | ORAL | 2 refills | Status: DC | PRN

## 2019-06-26 NOTE — Patient Instructions (Signed)
Your CT scan of the chest shows evidence for interstitial inflammation and early scarring.  This can come from rheumatoid arthritis, swallowing abnormalities, or there may be other explanations.  I would like for you to see my colleagues in the interstitial lung disease (ILD) clinic to review. We will perform pulmonary function testing before your ILD clinic visit We will refill your Tessalon Perles.  Use 1 up to every 6 hours if you need it for cough suppression.

## 2019-06-26 NOTE — Assessment & Plan Note (Signed)
Multifactorial.  Improved after treatment of her GERD, stopping her ACE inhibitor last year.  I think that her aspiration events may be worsening, becoming more frequent.  I will refill her Ruth Brown today.

## 2019-06-26 NOTE — Assessment & Plan Note (Signed)
Confirmed on her CT scan from yesterday, slight progression compared with 1 year ago.  She has new diagnosis of RA followed by Elpidio Anis with rheumatology.  She also has history of dysphagia with intermittent aspiration.  Either could be contributors.  She needs PFT and we will arrange for this.  I would like for her to see my colleagues in the ILD clinic and I will make the referral.  She should have a full autoimmune evaluation available through rheumatology

## 2019-06-26 NOTE — Progress Notes (Signed)
Subjective:    Patient ID: Ruth Brown, female    DOB: [birth year], 64 y.o.   MRN: [id number]  HPI 64 year old never smoker with anxiety/depression, hypertension, hyperlipidemia.  She is referred today for evaluation of shortness of breath and cough.  She reports that she began to experience dyspnea with speaking - almost gasping. Seemed to begin after a URI at the beginning of 2020. She continued to have dyspnea with any exertion. She has been treated with abx x 2. Most noticeable when she is speaking. She can ambulate with her cane, does get SOB after 100 ft.   Of note she was on an ACE inhibitor for her blood pressure, just recently stopped in late May. Her cough is improved, but still happens. She has a globus sensation. It can wake her, worse when supine.  No overt GERD sx. Occasional nasal congestion, better with Flonase. She describes coughing with eating and drinking, some dysphagia  CT  Chest 3/21 reviewed, shows some bibasilar interstitial scar, no overt honeycomb but ? ILD.   ROV 06/26/19 --Ruth Brown is 64 who follows up for dyspnea and history of cough.  She follows with Elpidio Anis for RA, is currently on prednisone, tapering slowly.  She is on Protonix and Flonase, now off ACE inhibitor.  Swallowing evaluation shows risk for aspiration.  She had a CT scan of her chest March 2020 that showed base predominant interstitial changes (not in a UIP pattern).  We repeated her CT chest on 06/25/2019 and I have reviewed.  This shows some subpleural reticulation and septal thickening with some cylindrical bronchiectatic change bilaterally most prominent in the mid to lower lung zones. Her cough seems to be worsening again. Her GERD is well controlled.  Note cardiology evaluation underway also.  MDM: -Reviewed CT chest from 3/17 -Reviewed cardiology notes from Dr. Cristal Deer 05/05/2019    Review of Systems  Constitutional: Negative.   HENT: Positive for congestion, rhinorrhea and  sneezing.   Respiratory: Positive for cough and shortness of breath.   Cardiovascular: Negative.   Gastrointestinal: Negative.   Musculoskeletal: Negative.     Past Medical History:  Diagnosis Date  . Anxiety   . Arthritis   . Depression   . Gallstone   . HTN (hypertension)   . Hyperlipidemia      Family History  Problem Relation Age of Onset  . Depression Mother   . Anxiety disorder Mother   . Alcohol abuse Mother   . Depression Maternal Aunt   . Anxiety disorder Maternal Aunt   . COPD Father   . Heart disease Father   . Hypertension Sister   . Prostate cancer Brother      Social History   Socioeconomic History  . Marital status: Married    Spouse name: Not on file  . Number of children: Not on file  . Years of education: Not on file  . Highest education level: Not on file  Occupational History  . Occupation: mortgage collections  Tobacco Use  . Smoking status: Never Smoker  . Smokeless tobacco: Never Used  Substance and Sexual Activity  . Alcohol use: No    Alcohol/week: 0.0 standard drinks  . Drug use: No  . Sexual activity: Not on file  Other Topics Concern  . Not on file  Social History Narrative  . Not on file   Social Determinants of Health   Financial Resource Strain:   . Difficulty of Paying Living Expenses:   Food Insecurity:   .  Worried About Charity fundraiser in the Last Year:   . Arboriculturist in the Last Year:   Transportation Needs:   . Film/video editor (Medical):   [name] Lack of Transportation (Non-Medical):   Physical Activity:   . Days of Exercise per Week:   . Minutes of Exercise per Session:   Stress:   . Feeling of Stress :   Social Connections:   . Frequency of Communication with Friends and Family:   . Frequency of Social Gatherings with Friends and Family:   . Attends Religious Services:   . Active Member of Clubs or Organizations:   . Attends Archivist Meetings:   [name] Marital Status:   Intimate Partner  Violence:   . Fear of Current or Ex-Partner:   . Emotionally Abused:   [name] Physically Abused:   . Sexually Abused:      No Known Allergies   Outpatient Medications Prior to Visit  Medication Sig Dispense Refill  . albuterol (PROVENTIL HFA;VENTOLIN HFA) 108 (90 Base) MCG/ACT inhaler Inhale 2 puffs into the lungs every 6 (six) hours as needed for wheezing or shortness of breath.    [name] albuterol (PROVENTIL) (2.5 MG/3ML) 0.083% nebulizer solution Take 3 mLs (2.5 mg total) by nebulization every 6 (six) hours as needed for wheezing or shortness of breath. 75 mL 12  . busPIRone (BUSPAR) 10 MG tablet Take 10 mg by mouth daily.    . citalopram (CELEXA) 20 MG tablet Take 1 tablet (20 mg total) by mouth daily. 90 tablet 0  . fluticasone (FLONASE) 50 MCG/ACT nasal spray Place 2 sprays into both nostrils daily.    . hydrochlorothiazide (HYDRODIURIL) 12.5 MG tablet Take by mouth.    [name] ibuprofen (ADVIL,MOTRIN) 800 MG tablet Take 800 mg by mouth every 6 (six) hours as needed for pain.    . naproxen (NAPROSYN) 375 MG tablet Take 375 mg by mouth daily as needed for pain.    . pantoprazole (PROTONIX) 40 MG tablet TAKE 1 TABLET BY MOUTH EVERY DAY 30 tablet 5  . Pseudoeph-Doxylamine-DM-APAP (NYQUIL PO) Take 1 Dose by mouth at bedtime as needed (cough and cold sx).    . simvastatin (ZOCOR) 20 MG tablet Take 20 mg by mouth every other day.     . vitamin B-12 (CYANOCOBALAMIN) 100 MCG tablet Take 100 mcg by mouth daily.    . benzonatate (TESSALON) 100 MG capsule Take 1-2 capsules (100-200 mg total) by mouth 3 (three) times daily as needed for cough. 40 capsule 0  . metoprolol tartrate (LOPRESSOR) 100 MG tablet TAKE 1 TABLET 2 HR PRIOR TO CARDIAC CT PROCEDURE (Patient not taking: Reported on 06/26/2019) 1 tablet 0  . acetaminophen-codeine (TYLENOL #3) 300-30 MG tablet Take 1 tablet by mouth every 6 (six) hours as needed. for pain     No facility-administered medications prior to visit.         Objective:    Physical Exam Vitals:   06/26/19 1508  BP: 118/68  Pulse: 73  Temp: 97.9 F (36.6 C)  TempSrc: Temporal  SpO2: 93%  Weight: 155 lb 9.6 oz (70.6 kg)  Height: 5' (1.524 m)   Gen: Pleasant, overwt woman, in no distress,  normal affect  ENT: No lesions,  mouth clear,  oropharynx clear, no postnasal drip  Neck: No JVD, some UA noise on a deep insp  Lungs: No use of accessory muscles, small breaths, no crackles or wheezing on normal respiration, no wheeze on forced expiration,  some referred UA noise.   Cardiovascular: RRR, heart sounds normal, no murmur or gallops, no peripheral edema  Musculoskeletal: No deformities, no cyanosis or clubbing  Neuro: alert, awake, non focal  Skin: Warm, no lesions or rash       Assessment & Plan:  ILD (interstitial lung disease) (HCC) Confirmed on her CT scan from yesterday, slight progression compared with 1 year ago.  She has new diagnosis of RA followed by Elpidio Anis with rheumatology.  She also has history of dysphagia with intermittent aspiration.  Either could be contributors.  She needs PFT and we will arrange for this.  I would like for her to see my colleagues in the ILD clinic and I will make the referral.  She should have a full autoimmune evaluation available through rheumatology  Cough Multifactorial.  Improved after treatment of her GERD, stopping her ACE inhibitor last year.  I think that her aspiration events may be worsening, becoming more frequent.  I will refill her Jerilynn Som today.  Levy Pupa, MD, PhD 06/26/2019, 3:34 PM [facility] [phone] or if no answer [phone]

## 2019-06-30 ENCOUNTER — Encounter (HOSPITAL_COMMUNITY): Payer: Self-pay

## 2019-06-30 ENCOUNTER — Telehealth (HOSPITAL_COMMUNITY): Payer: Self-pay | Admitting: Emergency Medicine

## 2019-06-30 NOTE — Telephone Encounter (Signed)
Reaching out to patient to offer assistance regarding upcoming cardiac imaging study; pt verbalizes understanding of appt date/time, parking situation and where to check in, pre-test NPO status and medications ordered, and verified current allergies; name and call back number provided for further questions should they arise Rockwell Alexandria RN Navigator Cardiac Imaging [facility] [phone] office [phone] cell   Attempted CCTA in the past and was unable to control HR with 50mg  PO metop.   Discussed with who suggests 100mg  PO metop + 5mg  ivabradine. Instructed patient to pick up sample of medication at [facility] office this afternoon or tomorrow.  She will be taking both meds together 2 hr prior to scan. Pt verbalized understanding. Appreciated the phone call.  

## 2019-07-01 ENCOUNTER — Telehealth (HOSPITAL_COMMUNITY): Payer: Self-pay | Admitting: Emergency Medicine

## 2019-07-01 NOTE — Telephone Encounter (Signed)
Phone call to patient to ensure she was able to pick up sample, said yes. Pt verbalized understanding to take both metoprolol + ivabradine 2 hr prior to scan. Appreciated the follow up call. Huntley Dec

## 2019-07-02 ENCOUNTER — Ambulatory Visit (HOSPITAL_COMMUNITY)
Admission: RE | Admit: 2019-07-02 | Discharge: 2019-07-02 | Disposition: A | Discharge status: Home / Self Care | Payer: 59 | Source: Ambulatory Visit | Attending: Cardiology | Admitting: Cardiology

## 2019-07-02 ENCOUNTER — Encounter (HOSPITAL_COMMUNITY): Payer: Self-pay

## 2019-07-02 ENCOUNTER — Other Ambulatory Visit: Payer: Self-pay

## 2019-07-02 ENCOUNTER — Telehealth: Payer: Self-pay | Admitting: Cardiology

## 2019-07-02 DIAGNOSIS — R079 Chest pain, unspecified: Secondary | ICD-10-CM

## 2019-07-02 DIAGNOSIS — Z006 Encounter for examination for normal comparison and control in clinical research program: Secondary | ICD-10-CM

## 2019-07-02 NOTE — Telephone Encounter (Signed)
Spoke with patient. Patient reports today was the second time she was unable to have CT completed. The first time was due to her HR and today her BP prevented the scan. She reports the BP was around 88/60. She reports she was told to call the office when she got home for her next steps. Patient reports Dr. Cristal Deer told the technician they would look into an alternative for the test. Patient is wanting to know whether she should make an appointment to discuss further or if next steps can be set up without an appointment. Patient is concerned about having to miss work but reports if a visit is needed she would prefer a virtual visit. Will route to Dr. Cristal Deer and Elease Hashimoto, RN for review.

## 2019-07-02 NOTE — Telephone Encounter (Signed)
Can you ask patient if she would be open to a nuclear stress test? We discussed at her initial visit and preferred the CT, but we have been unable to get her heart rate low enough to do the study despite ivabradine and metoprolol. If she is ok with doing the nuclear we can order this. She walks at home but has to use a cane on occasion, so I would recommend the chemical nuclear stress test. I apologize that it hasn't been successful twice--this is a rarity! If she is ok with the test we can order it without a follow up visit, but if she would like to discuss first I would be happy to do a virtual visit. Thanks.

## 2019-07-02 NOTE — Research (Signed)
Cadfem Informed Consent    Patient Name: Ruth Brown   Subject met inclusion and exclusion criteria.  The informed consent form, study requirements and expectations were reviewed with the subject and questions and concerns were addressed prior to the signing of the consent form.  The subject verbalized understanding of the trail requirements.  The subject agreed to participate in the CADFEM trial and signed the informed consent.  The informed consent was obtained prior to performance of any protocol-specific procedures for the subject.  A copy of the signed informed consent was given to the subject and a copy was placed in the subject's medical record.   Neva Seat

## 2019-07-02 NOTE — Progress Notes (Signed)
Notified Dr.Christopher that pt HR is mid 70's and BP is <100 systolic.  Pt took ivabirdene and metoprolol at 0926.  Dr. Cristal Deer stated the CT cardiac is cancelled due to HR and BP.  Notified patient of this, pt to follow up with Dr. Cristal Deer for alternative tests.  Pt discharged

## 2019-07-02 NOTE — Telephone Encounter (Signed)
Ruth Brown is calling due to not being able to have her CT performed today because her BP was to low. She states the lady she spoke with at the appointment advised her to call the office to see what Dr. Cristal Deer suggest be done in regards to this. Please advise.

## 2019-07-04 NOTE — Telephone Encounter (Signed)
Pt updated with MD's recommendation and voiced she would like to proceed with with lexiscan. Orders placed and message sent to scheduler.

## 2019-07-07 ENCOUNTER — Ambulatory Visit: Payer: 59 | Admitting: Cardiology

## 2019-07-10 ENCOUNTER — Telehealth (HOSPITAL_COMMUNITY): Payer: Self-pay

## 2019-07-10 NOTE — Telephone Encounter (Signed)
Spoke wit the patient, detailed instructions were given. She stated she understood and would be here for her test. Asked to call back with any questions. S.Shamir Tuzzolino EMTP

## 2019-07-15 ENCOUNTER — Ambulatory Visit (HOSPITAL_COMMUNITY): Payer: 59 | Attending: Cardiovascular Disease

## 2019-07-15 ENCOUNTER — Other Ambulatory Visit: Payer: Self-pay

## 2019-07-15 VITALS — Ht 60.0 in | Wt 161.0 lb

## 2019-07-15 DIAGNOSIS — R0602 Shortness of breath: Secondary | ICD-10-CM | POA: Diagnosis present

## 2019-07-15 DIAGNOSIS — R079 Chest pain, unspecified: Secondary | ICD-10-CM | POA: Diagnosis present

## 2019-07-15 LAB — MYOCARDIAL PERFUSION IMAGING
LV dias vol: 43 mL (ref 46–106)
LV sys vol: 12 mL
Peak HR: 121 {beats}/min
Rest HR: 91 {beats}/min
SDS: 0
SRS: 0
SSS: 0
TID: 0.85

## 2019-07-15 MED ORDER — TECHNETIUM TC 99M TETROFOSMIN IV KIT
10.2000 | PACK | Freq: Once | INTRAVENOUS | Status: AC | PRN
  Administered 2019-07-15: 10.2 via INTRAVENOUS
  Filled 2019-07-15: qty 11

## 2019-07-15 MED ORDER — REGADENOSON 0.4 MG/5ML IV SOLN
0.4000 mg | Freq: Once | INTRAVENOUS | Status: AC
  Administered 2019-07-15: 0.4 mg via INTRAVENOUS

## 2019-07-15 MED ORDER — TECHNETIUM TC 99M TETROFOSMIN IV KIT
30.4000 | PACK | Freq: Once | INTRAVENOUS | Status: AC | PRN
  Administered 2019-07-15: 30.4 via INTRAVENOUS
  Filled 2019-07-15: qty 31

## 2019-08-07 IMAGING — CT CT CHEST WITHOUT CONTRAST
2 of 3 series · 15 of 36 positions shown, 18 images · non-contrast
Comparison: Chest x-rays from 05/29/2018 and 06/29/2018 as well as
a prior CT of the abdomen and pelvis from 10/21/2016.

CLINICAL DATA: Persistent bibasilar infiltrates on recent chest
x-ray

EXAM:
CT CHEST WITHOUT CONTRAST
TECHNIQUE: Multidetector CT imaging of the chest was performed following the
standard protocol without IV contrast.

[Series 4: thorax 2.0 · axial · 0.68mm/px · z∈[-170,+42]mm · 12 of 126 slices shown, 15 images]
[im 10/126  mediastinal]
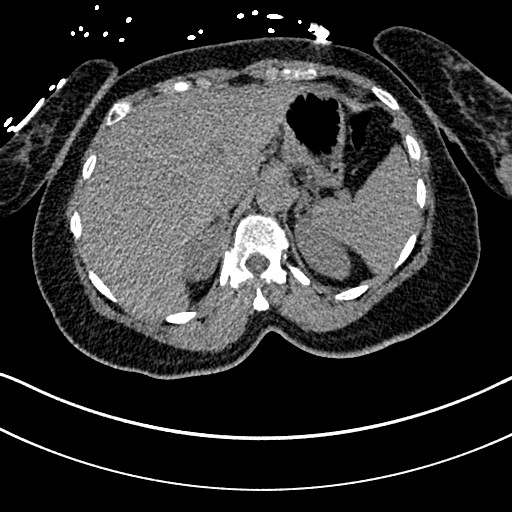
[im 10/126  lung]
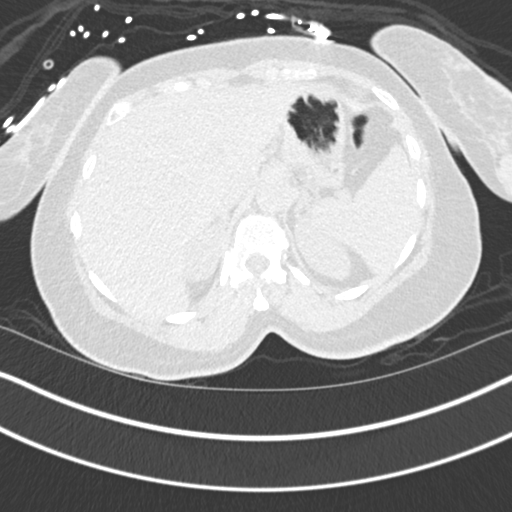
[im 19/126  lung]
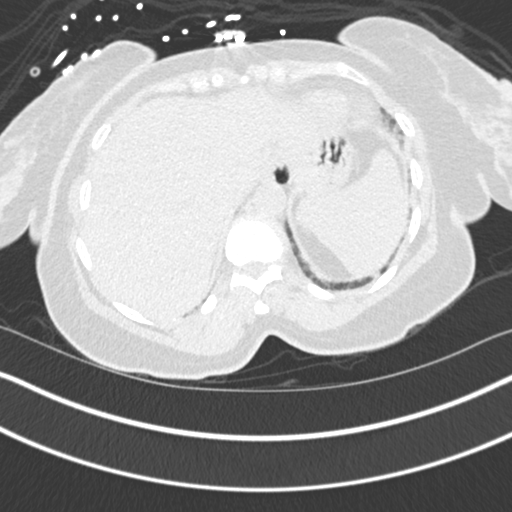
[im 28/126  lung]
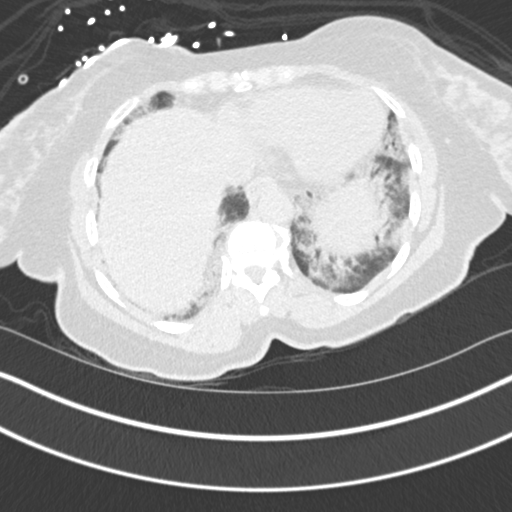
[im 38/126  lung]
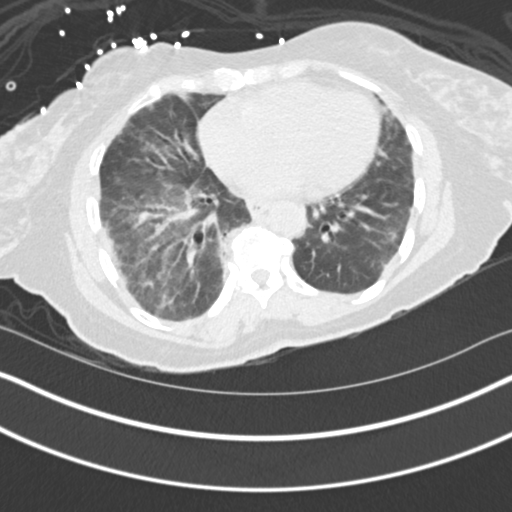
[im 47/126  mediastinal]
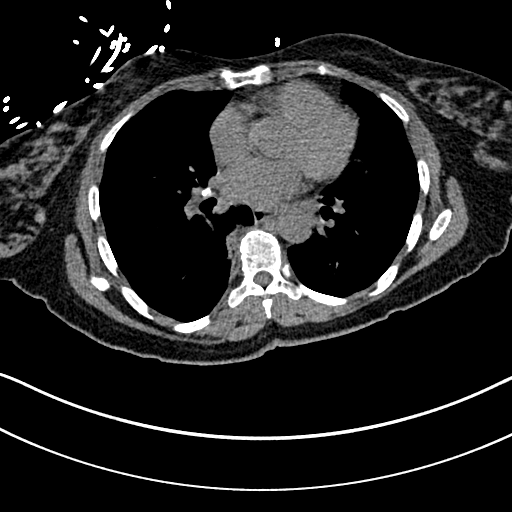
[im 47/126  lung]
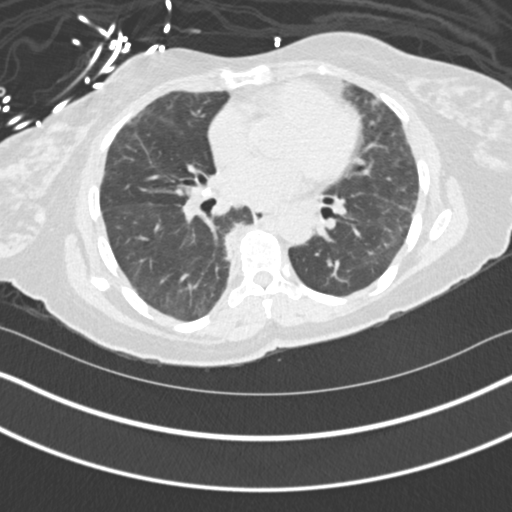
[im 56/126  lung]
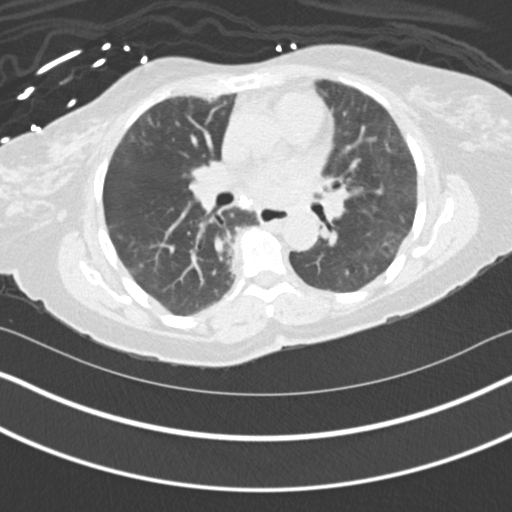
[im 70/126  lung]
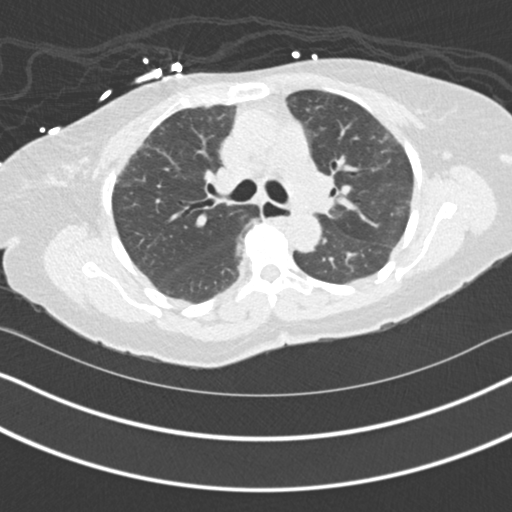
[im 79/126  lung]
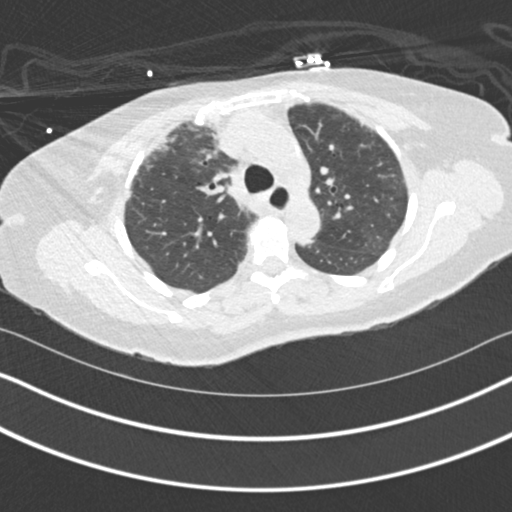
[im 88/126  mediastinal]
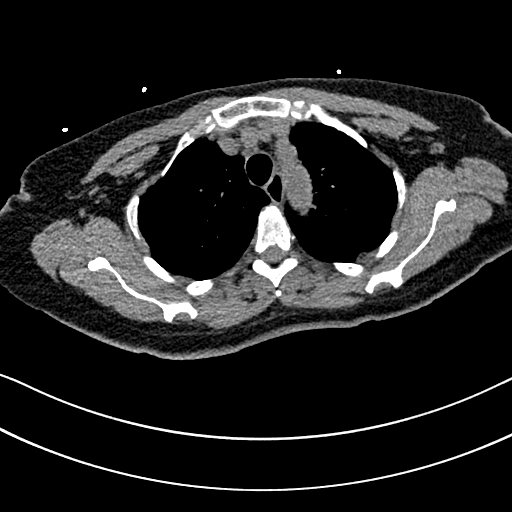
[im 88/126  lung]
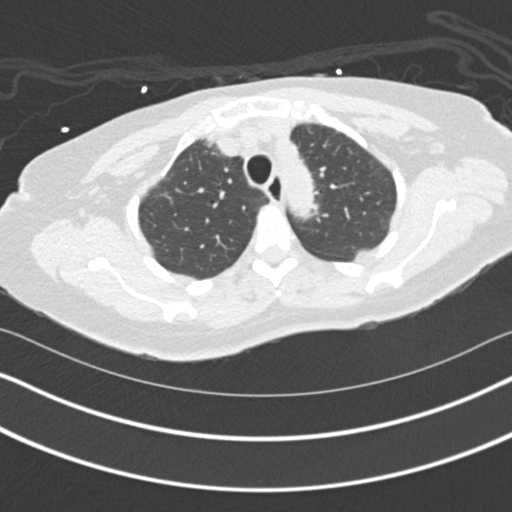
[im 98/126  lung]
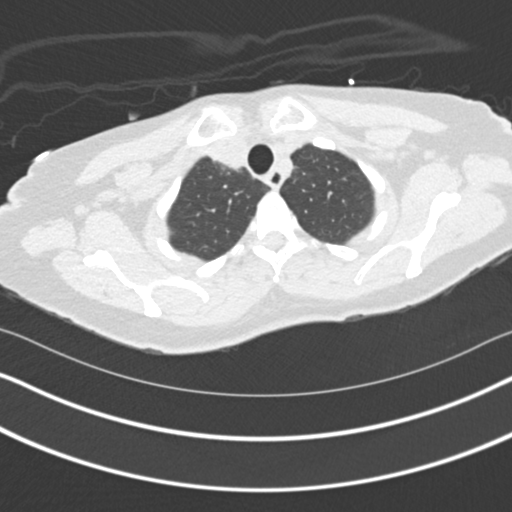
[im 107/126  lung]
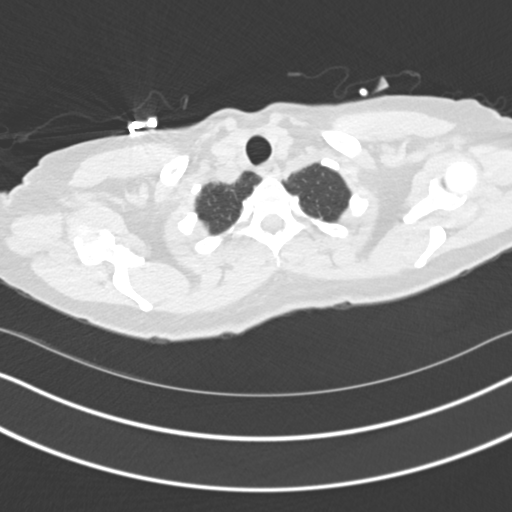
[im 116/126  lung]
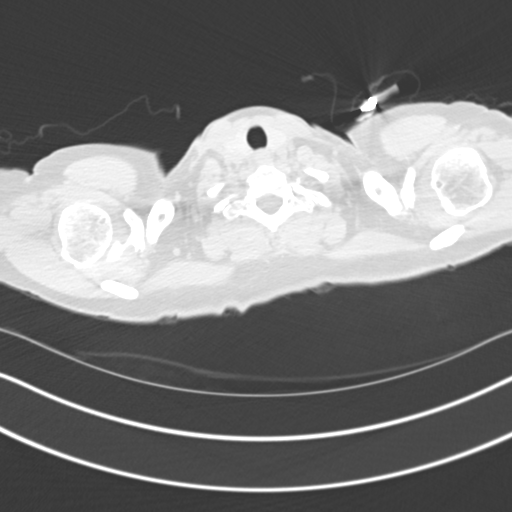

[Series 6: coronal · coronal · 0.59mm/px · 3 of 101 slices shown]
[im 21/101  lung]
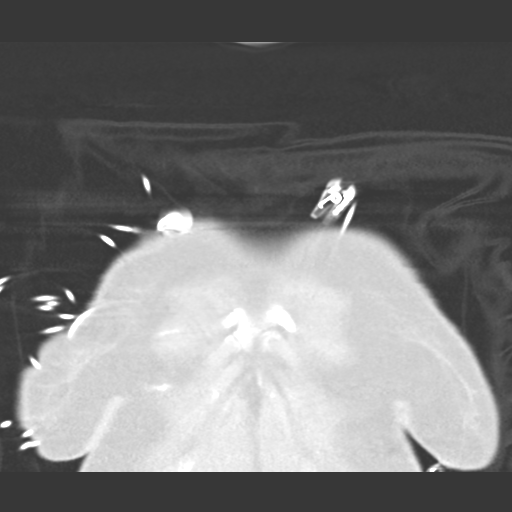
[im 41/101  lung]
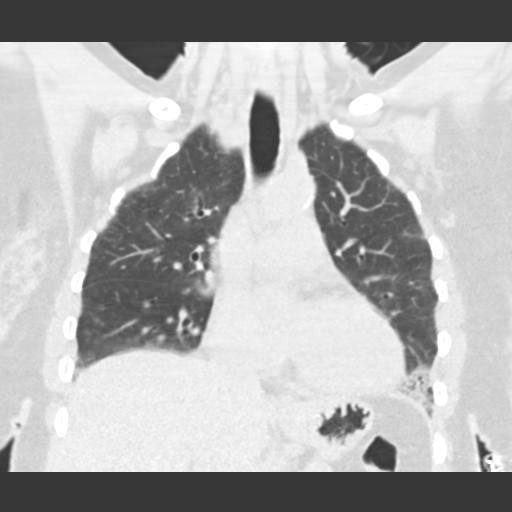
[im 61/101  lung]
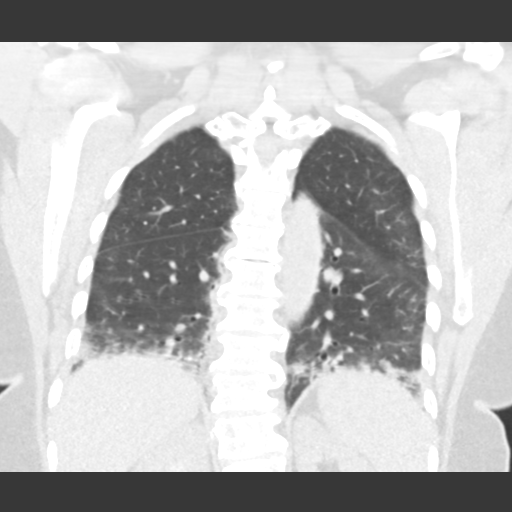

[15 of 36 positions shown; findings below may reference images not displayed]

FINDINGS: Cardiovascular: Somewhat limited due to lack of IV contrast. No
significant atherosclerotic calcifications are seen. No aneurysmal
dilatation is noted. Heart is not significantly enlarged.

Mediastinum/Nodes: Thoracic inlet is within normal limits. No
sizable hilar or mediastinal adenopathy is noted. Scattered hilar
and mediastinal calcified lymph nodes are seen consistent with prior
granulomatous disease. The esophagus is air-filled.

Lungs/Pleura: Lungs are well aerated bilaterally. Mild subpleural
fibrotic changes are noted throughout both lungs. Bronchiectasis and
perivascular ground-glass attenuation is noted in the lower lobes
bilaterally which corresponds to that seen on recent chest x-ray.
This is felt to be more likely related to chronic interstitial
disease. No sizable nodules are seen. No focal effusion is noted.

Upper Abdomen: Visualized upper abdomen is within normal limits.

Musculoskeletal: Degenerative changes of the thoracic spine are
noted. No acute bony abnormality is seen.
IMPRESSION: Bibasilar changes likely related to a more chronic underlying
interstitial process. No focal confluent infiltrate is seen at this
time.

Scattered subpleural interstitial fibrotic changes are noted as
well.

No acute abnormality is seen.

## 2019-08-08 ENCOUNTER — Other Ambulatory Visit (HOSPITAL_COMMUNITY)
Admission: RE | Admit: 2019-08-08 | Discharge: 2019-08-08 | Disposition: A | Discharge status: Home / Self Care | Payer: 59 | Source: Ambulatory Visit | Attending: Emergency Medicine | Admitting: Emergency Medicine

## 2019-08-08 DIAGNOSIS — Z20822 Contact with and (suspected) exposure to covid-19: Secondary | ICD-10-CM | POA: Diagnosis not present

## 2019-08-08 DIAGNOSIS — Z01812 Encounter for preprocedural laboratory examination: Secondary | ICD-10-CM | POA: Diagnosis present

## 2019-08-08 LAB — SARS CORONAVIRUS 2 (TAT 6-24 HRS): SARS Coronavirus 2: NEGATIVE

## 2019-08-11 ENCOUNTER — Telehealth: Payer: Self-pay | Admitting: Internal Medicine

## 2019-08-11 ENCOUNTER — Ambulatory Visit (INDEPENDENT_AMBULATORY_CARE_PROVIDER_SITE_OTHER): Payer: 59 | Admitting: Pulmonary Disease

## 2019-08-11 ENCOUNTER — Ambulatory Visit: Payer: 59 | Admitting: Pulmonary Disease

## 2019-08-11 ENCOUNTER — Encounter: Payer: Self-pay | Admitting: Pulmonary Disease

## 2019-08-11 ENCOUNTER — Other Ambulatory Visit: Payer: Self-pay

## 2019-08-11 ENCOUNTER — Other Ambulatory Visit: Payer: 59

## 2019-08-11 VITALS — BP 122/72 | HR 91 | Temp 97.2°F | Ht 60.0 in | Wt 148.8 lb

## 2019-08-11 DIAGNOSIS — J849 Interstitial pulmonary disease, unspecified: Secondary | ICD-10-CM

## 2019-08-11 LAB — PULMONARY FUNCTION TEST
DL/VA % pred: 96 %
DL/VA: 4.19 ml/min/mmHg/L
DLCO cor % pred: 54 %
DLCO cor: 9.44 ml/min/mmHg
DLCO unc % pred: 54 %
DLCO unc: 9.44 ml/min/mmHg
FEF 25-75 Post: 2.99 L/sec
FEF 25-75 Pre: 3.38 L/sec
FEF2575-%Change-Post: -11 %
FEF2575-%Pred-Post: 173 %
FEF2575-%Pred-Pre: 196 %
FEV1-%Change-Post: 19 %
FEV1-%Pred-Post: 64 %
FEV1-%Pred-Pre: 53 %
FEV1-Post: 1.09 L
FEV1-Pre: 0.91 L
FEV1FVC-%Change-Post: -6 %
FEV1FVC-%Pred-Pre: 109 %
FEV6-%Change-Post: 27 %
FEV6-%Pred-Post: 64 %
FEV6-%Pred-Pre: 50 %
FEV6-Post: 1.35 L
FEV6-Pre: 1.05 L
FEV6FVC-%Change-Post: 0 %
FEV6FVC-%Pred-Post: 103 %
FEV6FVC-%Pred-Pre: 104 %
FVC-%Change-Post: 28 %
FVC-%Pred-Post: 62 %
FVC-%Pred-Pre: 48 %
FVC-Post: 1.35 L
FVC-Pre: 1.05 L
Post FEV1/FVC ratio: 81 %
Post FEV6/FVC ratio: 100 %
Pre FEV1/FVC ratio: 86 %
Pre FEV6/FVC Ratio: 100 %
RV % pred: 44 %
RV: 0.81 L
TLC % pred: 46 %
TLC: 2.06 L

## 2019-08-11 MED ORDER — AZATHIOPRINE 50 MG PO TABS: 50.0000 mg | ORAL_TABLET | Freq: Every day | ORAL | 3 refills | Status: DC

## 2019-08-11 NOTE — Progress Notes (Signed)
PFT done today. 

## 2019-08-11 NOTE — Patient Instructions (Addendum)
We will get some labs today including CK, aldolase, myositis panel, TPMT levels Start azathioprine 50 mg a day.  We will refer you to pharmacy for medication management. We will check with cardiology to see if he can get right heart catheterization done  Follow up in 2-4 weeks

## 2019-08-11 NOTE — Telephone Encounter (Signed)
Ruth Brown  For some reason the rheum notes landed on my desk but noticed she Ruth Brown is seeing you 08/11/19. CT is worse. She has RA-ILD/UIP. I am copying [name] here but she might qualify for ILD-PRO. You can give her an ICF and [name] can followup later with her  Thanks    SIGNATURE    Dr. Kalman Shan, M.D., F.C.C.P,  Pulmonary and Critical Care Medicine Staff Physician, [facility] Director - Interstitial Lung Disease  Program  Pulmonary Fibrosis [facility] [place], [place], [address]  Pager: [phone], If no answer or between  15:00h - 7:00h: call [phone] Telephone: [phone]  8:34 AM 08/11/2019

## 2019-08-11 NOTE — Progress Notes (Addendum)
Ruth Brown    [id number]    January 29, 1956  Primary Care Physician:Briscoe, Sharrie Rothman, MD  Referring Physician: Macy Mis, MD [address] [place],  [place] [address]  Chief complaint:   ILD Chronic Cough - Improved with treatment of GERD  HPI:  Ruth Brown is a 64 year old female with Rheumatoid Arthritis (+RF, neg CCP, ANA 1:1280) referred by Dr.Byrum after CT on 3/17 showed ILD. Patient first presented to Dr.Byrum 6 months ago for dyspnea and cough. MBS on 10/15/2018 showed patient consistent with GERD, cough improved with treatment of GERD. Patient followed by Elpidio Anis for possible RA.  Patient's RA being treated with Remicade and Prednisone, and reports improvement in joint pain. Her dyspnea is progressively worsened over the last 6 month.   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  Exposures:  Smoking history: Never smoker Travel history: Relevant family history: Father COPD   Outpatient Encounter Medications as of 08/11/2019  Medication Sig  . albuterol (PROVENTIL HFA;VENTOLIN HFA) 108 (90 Base) MCG/ACT inhaler Inhale 2 puffs into the lungs every 6 (six) hours as needed for wheezing or shortness of breath.  [name] albuterol (PROVENTIL) (2.5 MG/3ML) 0.083% nebulizer solution Take 3 mLs (2.5 mg total) by nebulization every 6 (six) hours as needed for wheezing or shortness of breath.  . benzonatate (TESSALON) 100 MG capsule Take 1-2 capsules (100-200 mg total) by mouth 3 (three) times daily as needed for cough.  . busPIRone (BUSPAR) 10 MG tablet Take 10 mg by mouth daily.  . citalopram (CELEXA) 20 MG tablet Take 1 tablet (20 mg total) by mouth daily.  . fluticasone (FLONASE) 50 MCG/ACT nasal spray Place 2 sprays into both nostrils daily.  . hydrochlorothiazide (HYDRODIURIL) 12.5 MG tablet Take by mouth.  . simvastatin (ZOCOR) 20 MG tablet Take 20 mg by mouth every other day.   . vitamin B-12 (CYANOCOBALAMIN) 100 MCG tablet Take  100 mcg by mouth daily.  [name] ibuprofen (ADVIL,MOTRIN) 800 MG tablet Take 800 mg by mouth every 6 (six) hours as needed for pain.  . metoprolol tartrate (LOPRESSOR) 100 MG tablet TAKE 1 TABLET 2 HR PRIOR TO CARDIAC CT PROCEDURE (Patient not taking: Reported on 06/26/2019)  . naproxen (NAPROSYN) 375 MG tablet Take 375 mg by mouth daily as needed for pain.  . pantoprazole (PROTONIX) 40 MG tablet TAKE 1 TABLET BY MOUTH EVERY DAY  . Pseudoeph-Doxylamine-DM-APAP (NYQUIL PO) Take 1 Dose by mouth at bedtime as needed (cough and cold sx).   No facility-administered encounter medications on file as of 08/11/2019.    Allergies as of 08/11/2019  . (No Known Allergies)    Past Medical History:  Diagnosis Date  . Anxiety   . Arthritis   . Depression   . Gallstone   . HTN (hypertension)   . Hyperlipidemia     Past Surgical History:  Procedure Laterality Date  . CESAREAN SECTION      Family History  Problem Relation Age of Onset  . Depression Mother   . Anxiety disorder Mother   . Alcohol abuse Mother   . Depression Maternal Aunt   . Anxiety disorder Maternal Aunt   . COPD Father   . Heart disease Father   . Hypertension Sister   . Prostate cancer Brother     Social History   Socioeconomic History  . Marital status: Married    Spouse name: Not on file  . Number of  children: Not on file  . Years of education: Not on file  . Highest education level: Not on file  Occupational History  . Occupation: mortgage collections  Tobacco Use  . Smoking status: Never Smoker  . Smokeless tobacco: Never Used  Substance and Sexual Activity  . Alcohol use: No    Alcohol/week: 0.0 standard drinks  . Drug use: No  . Sexual activity: Not on file  Other Topics Concern  . Not on file  Social History Narrative  . Not on file   Social Determinants of Health   Financial Resource Strain:   . Difficulty of Paying Living Expenses:   Food Insecurity:   . Worried About Charity fundraiser in the  Last Year:   . Arboriculturist in the Last Year:   Transportation Needs:   . Film/video editor (Medical):   [name] Lack of Transportation (Non-Medical):   Physical Activity:   . Days of Exercise per Week:   . Minutes of Exercise per Session:   Stress:   . Feeling of Stress :   Social Connections:   . Frequency of Communication with Friends and Family:   . Frequency of Social Gatherings with Friends and Family:   . Attends Religious Services:   . Active Member of Clubs or Organizations:   . Attends Archivist Meetings:   [name] Marital Status:   Intimate Partner Violence:   . Fear of Current or Ex-Partner:   . Emotionally Abused:   [name] Physically Abused:   . Sexually Abused:     Review of systems: Review of Systems  Constitutional: Negative for fever and chills.  HENT: Negative.   Eyes: Negative for blurred vision.  Respiratory: as per HPI  Cardiovascular: Negative for chest pain and palpitations.  Gastrointestinal: Negative for vomiting, diarrhea, blood per rectum. Genitourinary: Negative for dysuria, urgency, frequency and hematuria.  Musculoskeletal: Positve for myalgias and joint pain.  Skin: Negative for itching and rash.  Neurological: Negative for dizziness, tremors, focal weakness, seizures and loss of consciousness.  Endo/Heme/Allergies: Negative for environmental allergies.  Psychiatric/Behavioral: Negative for depression, suicidal ideas and hallucinations.  All other systems reviewed and are negative.  Physical Exam: Blood pressure 122/72, pulse 91, temperature (!) 97.2 F (36.2 C), temperature source Temporal, height 5' (1.524 m), weight 148 lb 12.8 oz (67.5 kg), SpO2 96 %. Gen:      No acute distress HEENT:  EOMI, sclera anicteric Neck:     No masses; no thyromegaly Lungs:   course breath sounds bilateral bases , no rales or rhonchi, normal respiratory effort CV:         Regular rate and rhythm; no murmurs Abd:      + bowel sounds; soft, non-tender; no  palpable masses, no distension Ext:    No edema; adequate peripheral perfusion Skin:      Warm and dry; no rash Neuro: alert and oriented x 3 Psych: normal mood and affect  Data Reviewed: Imaging: CT Chest High Resolution 06/25/2019-considerable respiratory motion.  Worsening peripheral septal thickening with reticulation, bronchiectasis.  I have reviewed the images personally.   PFTs: 08/11/2019 FVC 1.05(48%) FEV1 .91(53%) F/F 86 (109) TLC 2.06 (46%) DLCO 9.44 (54%) Severe restriction, moderate-severe diffusion defect  Labs:   10/06/2018 ANA 1:1280, notable reflex labs: RF 15 ( nl <14), CCP neg, Jo 1 -, Sjogren's antibodies negative,   aldolase 10  Cardiac:  EF 19-62%, grade 1 diastolic dysfunction, no regional wall motion abnormalities, right atrium moderately  dilated, RVP pressure mildly elevated 40.9 mmHg   Assessment: Interstitial lung disease CT consistent with RA-ILD.  Patient currently on prednisone and Remicade for possible rheumatoid arthritis.  RF factor low end of normal and CCP negative. ANA 1:1280 convincing of rheumatologic disease, but reflex panel unrevealing. Will order myositis panel. for further workup. Anti-synthetase could present with myalgia, joint involvement, and ILD. No ' Print production planner' on exam.   Will start Azathioprine, this may help with joint and lung involvement. Check Thiopurine methytransferase with starting Azathioprine today. If TPMT deficient, will stop and start Cellcept.   Close follow up and consider antifibrotic if there is progression.  Needs right heart cath to better evaluate elevated right heart pressures and possible PHTN.   Will check CK total and CKMB, Aldolase, Myositis panel  Plan/Recommendations: - CK total and CKMB - Aldolase - Myositis Panel - TMPT - Referral to cardiology for right heart catheterization   Thurmon Fair, MD PGY1   Chilton Greathouse MD [facility] Please see [email or web address] for pager  details.  08/11/2019, 1:50 PM  CC: Macy Mis, MD

## 2019-08-15 LAB — CK TOTAL AND CKMB (NOT AT ARMC)
CK, MB: 3.4 ng/mL (ref 0–5.0)
Relative Index: 2.2 (ref 0–4.0)
Total CK: 154 U/L — ABNORMAL HIGH (ref 29–143)

## 2019-08-15 LAB — ALDOLASE: Aldolase: 8.8 U/L — ABNORMAL HIGH (ref <=8.1)

## 2019-08-15 LAB — THIOPURINE METHYLTRANSFERASE (TPMT), RBC: Thiopurine Methyltransferase, RBC: 17 nmol/hr/mL RBC

## 2019-08-21 LAB — MYOMARKER 3 PLUS PROFILE (RDL)
Anti-EJ Ab (RDL): NEGATIVE
Anti-Jo-1 Ab (RDL): <20 Units (ref <20)
Anti-Ku Ab (RDL): NEGATIVE
Anti-MDA-5 Ab (CADM-140)(RDL): <20 Units (ref <20)
Anti-Mi-2 Ab (RDL): NEGATIVE
Anti-NXP-2 (P140) Ab (RDL): <20 Units (ref <20)
Anti-OJ Ab (RDL): NEGATIVE
Anti-PL-12 Ab (RDL: POSITIVE — AB
Anti-PL-7 Ab (RDL): NEGATIVE
Anti-PM/Scl-100 Ab (RDL): <20 Units (ref <20)
Anti-SAE1 Ab, IgG (RDL): <20 Units (ref <20)
Anti-SRP Ab (RDL): NEGATIVE
Anti-SS-A 52kD Ab, IgG (RDL): <20 Units (ref <20)
Anti-TIF-1gamma Ab (RDL): <20 Units (ref <20)
Anti-U1 RNP Ab (RDL): <20 Units (ref <20)
Anti-U2 RNP Ab (RDL): NEGATIVE
Anti-U3 RNP (Fibrillarin)(RDL): NEGATIVE

## 2019-08-31 NOTE — Progress Notes (Signed)
 This encounter was created in error - please disregard.

## 2019-09-02 ENCOUNTER — Other Ambulatory Visit: Payer: Self-pay | Admitting: Pulmonary Disease

## 2019-09-02 DIAGNOSIS — J849 Interstitial pulmonary disease, unspecified: Secondary | ICD-10-CM

## 2019-09-03 ENCOUNTER — Telehealth: Payer: Self-pay | Admitting: Pulmonary Disease

## 2019-09-03 NOTE — Telephone Encounter (Signed)
Spoke with patient. She stated that she has had a sore throat for the past few days as well as a headache and fever. She has been taking Tylenol to help with the headache. She is scheduled for an OV tomorrow and wanted to know if the appts would be converted to televisits. I advised her that we could convert both of the visits with Dr. Isaiah Serge and pharmacy to televisits.   She verbalized understanding. Did advise her if she noticed any respiratory symptoms to give our office a call back. She verbalized understanding.   Nothing further needed at time of call.

## 2019-09-04 ENCOUNTER — Ambulatory Visit (INDEPENDENT_AMBULATORY_CARE_PROVIDER_SITE_OTHER): Payer: 59 | Admitting: Pulmonary Disease

## 2019-09-04 ENCOUNTER — Other Ambulatory Visit: Payer: Self-pay

## 2019-09-04 ENCOUNTER — Encounter: Payer: Self-pay | Admitting: Pulmonary Disease

## 2019-09-04 DIAGNOSIS — J849 Interstitial pulmonary disease, unspecified: Secondary | ICD-10-CM | POA: Diagnosis not present

## 2019-09-04 NOTE — Progress Notes (Signed)
Ruth Brown    [id number]    June 12, 1955  Primary Care Physician:Briscoe, Jannifer Rodney, MD  Referring Physician: [name], MD [address] [place],  [place] [address]  Chief complaint:   ILD Chronic Cough - Improved with treatment of GERD  HPI:  Ruth Brown is a 64 year old female with Rheumatoid Arthritis (+RF, neg CCP, ANA 1:1280) referred by Dr.Byrum after CT on 3/17 showed ILD. Patient first presented to Dr.Byrum 6 months ago for dyspnea and cough. MBS on 10/15/2018 showed patient consistent with GERD, cough improved with treatment of GERD. Patient followed by Marella Chimes for possible RA.  Patient's RA being treated with Remicade and Prednisone, and reports improvement in joint pain. Her dyspnea is progressively worsened over the last 6 month.   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  Exposures:  Smoking history: Never smoker Travel history: Relevant family history: Father COPD  Virtual Visit via Telephone Note  I connected with Ruth Brown on 09/04/19 at  1:45 PM EDT by telephone and verified that I am speaking with the correct person using two identifiers.  Location: Patient: Home Provider: Office   I discussed the limitations, risks, security and privacy concerns of performing an evaluation and management service by telephone and the availability of in person appointments. I also discussed with the patient that there may be a patient responsible charge related to this service. The patient expressed understanding and agreed to proceed.  Interim history:    Outpatient Encounter Medications as of 09/04/2019  Medication Sig  . albuterol (PROVENTIL HFA;VENTOLIN HFA) 108 (90 Base) MCG/ACT inhaler Inhale 2 puffs into the lungs every 6 (six) hours as needed for wheezing or shortness of breath.  [name] albuterol (PROVENTIL) (2.5 MG/3ML) 0.083% nebulizer solution Take 3 mLs (2.5 mg total) by nebulization every 6 (six) hours  as needed for wheezing or shortness of breath.  . azaTHIOprine (IMURAN) 50 MG tablet TAKE 1 TABLET BY MOUTH EVERY DAY  . benzonatate (TESSALON) 100 MG capsule Take 1-2 capsules (100-200 mg total) by mouth 3 (three) times daily as needed for cough.  . busPIRone (BUSPAR) 10 MG tablet Take 10 mg by mouth daily.  . celecoxib (CELEBREX) 100 MG capsule Take by mouth.  . citalopram (CELEXA) 20 MG tablet Take 1 tablet (20 mg total) by mouth daily.  . fluticasone (FLONASE) 50 MCG/ACT nasal spray Place 2 sprays into both nostrils daily.  . folic acid (FOLVITE) 1 MG tablet Take 1 mg by mouth daily.  . hydrochlorothiazide (HYDRODIURIL) 12.5 MG tablet Take by mouth.  [name] ibuprofen (ADVIL,MOTRIN) 800 MG tablet Take 800 mg by mouth every 6 (six) hours as needed for pain.  [name] inFLIXimab (REMICADE) 100 MG injection Inject into the vein.  . Misc. Devices MISC Mask and tubing for SierraNeb 2 - Aero flow  . omeprazole (PRILOSEC) 20 MG capsule Take 20 mg by mouth daily.  . polyethylene glycol powder (GLYCOLAX/MIRALAX) 17 GM/SCOOP powder polyethylene glycol 3350 17 gram/dose oral powder  TAKE SEVENTEEN G BY MOUTH DAILY FOR 3 DAYS.  [name] predniSONE (DELTASONE) 5 MG tablet Take 5-10 mg by mouth daily.  . simvastatin (ZOCOR) 20 MG tablet Take 20 mg by mouth every other day.   . vitamin B-12 (CYANOCOBALAMIN) 100 MCG tablet Take 100 mcg by mouth daily.  . [DISCONTINUED] metoprolol tartrate (LOPRESSOR) 100 MG tablet TAKE 1 TABLET 2 HR PRIOR TO CARDIAC CT PROCEDURE (Patient not taking:  Reported on 06/26/2019)  . [DISCONTINUED] naproxen (NAPROSYN) 375 MG tablet Take 375 mg by mouth daily as needed for pain.  . [DISCONTINUED] pantoprazole (PROTONIX) 40 MG tablet TAKE 1 TABLET BY MOUTH EVERY DAY  . [DISCONTINUED] Pseudoeph-Doxylamine-DM-APAP (NYQUIL PO) Take 1 Dose by mouth at bedtime as needed (cough and cold sx).   No facility-administered encounter medications on file as of 09/04/2019.   Physical Exam: Televisit  Data  Reviewed: Imaging: CT Chest High Resolution 06/25/2019-considerable respiratory motion.  Worsening peripheral septal thickening with reticulation, bronchiectasis.  I have reviewed the images personally.  PFTs: 08/11/2019 FVC 1.05(48%) FEV1 .91(53%) F/F 86 (109) TLC 2.06 (46%) DLCO 9.44 (54%) Severe restriction, moderate-severe diffusion defect  Labs: 10/06/2018 ANA 1:1280, notable reflex labs: RF 15 ( nl <14), CCP neg, Jo 1 -, Sjogren's antibodies negative  CK 08/11/2019-154 Aldolase 08/11/2019-8.8 Myositis panel 08/11/2019-positive PL 12 antibody TPMT 08/11/2019-17  Cardiac: EF 60-65%, grade 1 diastolic dysfunction, no regional wall motion abnormalities, right atrium moderately dilated, RVP pressure mildly elevated 40.9 mmHg  Assessment: Interstitial lung disease CT consistent with CTD ILD.  Patient currently on prednisone and Remicade for possible rheumatoid arthritis.  RF factor low end of normal and CCP negative. ANA 1:1280 convincing of rheumatologic disease.  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 No ' Print production planner' on exam.  Noted to have elevated CK, aldolase and PL 12 antibody.  She has been started Azathioprine as it helps with joint and lung involvement.  TPMT levels are okay We will check labs and if normal can increase azathioprine to 100 mg Close follow up and consider antifibrotic if there is progression.  Refered to cardiology for right heart cath to better evaluate elevated right heart pressures and possible PHTN.   Plan/Recommendations: - Continue azathioprine.  Check metabolic panel, CBC. - Referral to cardiology for right heart catheterization   I discussed the assessment and treatment plan with the patient. The patient was provided an opportunity to ask questions and all were answered. The patient agreed with the plan and demonstrated an understanding of the instructions.   The patient was advised to call back or seek an  in-person evaluation if the symptoms worsen or if the condition fails to improve as anticipated.  I provided 25 minutes of non-face-to-face time during this encounter.  Ruth Greathouse MD [facility] Please see [email or web address] for pager details.  09/04/2019, 2:12 PM  CC: Macy Mis, MD

## 2019-09-04 NOTE — Patient Instructions (Addendum)
I am sorry you are not feeling well with nausea.  Stay well rested and hydrate yourself If it does not get better than give [place] a call We will check some labs today including comprehensive metabolic panel and CBC.  Please go to eliminate when you can get these checked Follow-up in 1 month.  Televisit okay.

## 2019-09-05 ENCOUNTER — Telehealth: Payer: Self-pay | Admitting: Cardiology

## 2019-09-05 ENCOUNTER — Other Ambulatory Visit: Payer: Self-pay

## 2019-09-05 NOTE — Telephone Encounter (Signed)
There is a referral in the [facility] for this patient to have a heart cath. The referral is from Dr. Chilton Greathouse.  Please evaluate

## 2019-09-08 NOTE — Telephone Encounter (Signed)
Can you ask her to schedule an appt with [place]? Appears that Dr. Isaiah Serge wants a right heart cath, but we will need to discuss in office and do labs/ECG. Thanks.

## 2019-09-09 NOTE — Telephone Encounter (Signed)
Called pt to schedule appointment based on PCP referral. Pt voiced she is very frustrated and did not authorize her pcp to send a referral on her behalf for another test. Pt state she does not want an in office appointment and would prefer to talk to Dr. Cristal Deer over the phone to express her frustration and concerns.   Virtual appointment scheduled for 6/9 @ 9:40 am.

## 2019-09-17 ENCOUNTER — Telehealth (INDEPENDENT_AMBULATORY_CARE_PROVIDER_SITE_OTHER): Payer: 59 | Admitting: Cardiology

## 2019-09-17 ENCOUNTER — Telehealth: Payer: 59 | Admitting: Cardiology

## 2019-09-17 VITALS — Ht 60.0 in | Wt 138.0 lb

## 2019-09-17 DIAGNOSIS — I10 Essential (primary) hypertension: Secondary | ICD-10-CM

## 2019-09-17 DIAGNOSIS — E782 Mixed hyperlipidemia: Secondary | ICD-10-CM

## 2019-09-17 DIAGNOSIS — I272 Pulmonary hypertension, unspecified: Secondary | ICD-10-CM

## 2019-09-17 DIAGNOSIS — R0602 Shortness of breath: Secondary | ICD-10-CM

## 2019-09-17 DIAGNOSIS — Z01812 Encounter for preprocedural laboratory examination: Secondary | ICD-10-CM | POA: Diagnosis not present

## 2019-09-17 NOTE — Progress Notes (Signed)
Virtual Visit via Telephone Note   This visit type was conducted due to national recommendations for restrictions regarding the COVID-19 Pandemic (e.g. social distancing) in an effort to limit this patient's exposure and mitigate transmission in our community.  Due to her co-morbid illnesses, this patient is at least at moderate risk for complications without adequate follow up.  This format is felt to be most appropriate for this patient at this time.  The patient did not have access to video technology/had technical difficulties with video requiring transitioning to audio format only (telephone).  All issues noted in this document were discussed and addressed.  No physical exam could be performed with this format.  Please refer to the patient's chart for her  consent to telehealth for [facility].   The patient was identified using 2 identifiers.  Date:  09/17/2019   ID:  Ruth Brown, DOB October 27, 1955, MRN [id number]  PCP:  [name], MD  Cardiologist:  Buford Dresser, MD  Referring MD: [name], MD   CC: follow up  History of Present Illness:    Ruth Brown is a 64 y.o. female with a hx of chronic bronchitis and asthma, hypertension, hyperlipidemia. She was seen 05/05/19 as a new consult at the request of [name], MD for the evaluation and management of chest tightness.  Cardiac history: first seen 05/05/19 for chest tightness related to shortness of breath. We planned for a CT coronary to evaluate further, but this could not be completed despite two attempts (3/3 and 3/24) due to inability to get heart rate to goal on 3/3 and hypotension on 3/24. Test was changed to a lexiscan myoview, which was normal. Echocardiogram noted elevated right sided filling pressures (RVSP 40). Follows with pulmonology for her lung disease.   Today: She was referred back to cardiology to facilitate right heart cath by Dr. Vaughan Browner. We discussed right heart cath at length.  Reviewed the indications, how it is performed, risks and benefits. She appreciated this, is willing to go forward but wanted to talk about what the test is first.  Overall feels like she is doing very well. Denies chest pain, PND, orthopnea, LE edema or unexpected weight gain. No syncope or palpitations.  Past Medical History:  Diagnosis Date  . Anxiety   . Arthritis   . Depression   . Gallstone   . HTN (hypertension)   . Hyperlipidemia     Past Surgical History:  Procedure Laterality Date  . CESAREAN SECTION      Current Medications: Current Outpatient Medications on File Prior to Visit  Medication Sig  . albuterol (PROVENTIL HFA;VENTOLIN HFA) 108 (90 Base) MCG/ACT inhaler Inhale 2 puffs into the lungs every 6 (six) hours as needed for wheezing or shortness of breath.  [name] albuterol (PROVENTIL) (2.5 MG/3ML) 0.083% nebulizer solution Take 3 mLs (2.5 mg total) by nebulization every 6 (six) hours as needed for wheezing or shortness of breath.  . azaTHIOprine (IMURAN) 50 MG tablet TAKE 1 TABLET BY MOUTH EVERY DAY  . busPIRone (BUSPAR) 10 MG tablet Take 10 mg by mouth daily.  . celecoxib (CELEBREX) 100 MG capsule Take by mouth.  . Cholecalciferol (VITAMIN D3) 1.25 MG (50000 UT) TABS Take by mouth once a week.  . citalopram (CELEXA) 20 MG tablet Take 1 tablet (20 mg total) by mouth daily.  . fluticasone (FLONASE) 50 MCG/ACT nasal spray Place 2 sprays into both nostrils daily.  . folic acid (FOLVITE) 1 MG tablet Take  1 mg by mouth daily.  . hydrochlorothiazide (HYDRODIURIL) 12.5 MG tablet Take by mouth.  . inFLIXimab (REMICADE) 100 MG injection Inject into the vein.  . Misc. Devices MISC Mask and tubing for SierraNeb 2 - Aero flow  . omeprazole (PRILOSEC) 20 MG capsule Take 20 mg by mouth daily.  . predniSONE (DELTASONE) 5 MG tablet Take 5-10 mg by mouth daily.  . simvastatin (ZOCOR) 20 MG tablet Take 20 mg by mouth every other day.   . vitamin B-12 (CYANOCOBALAMIN) 100 MCG tablet Take  100 mcg by mouth daily.   No current facility-administered medications on file prior to visit.     Allergies:   Patient has no known allergies.   Social History   Tobacco Use  . Smoking status: Never Smoker  . Smokeless tobacco: Never Used  Substance Use Topics  . Alcohol use: No    Alcohol/week: 0.0 standard drinks  . Drug use: No    Family History: family history includes Alcohol abuse in her mother; Anxiety disorder in her maternal aunt and mother; COPD in her father; Depression in her maternal aunt and mother; Heart disease in her father; Hypertension in her sister; Prostate cancer in her brother. father had COPD, heart disease  ROS:   Please see the history of present illness.  Additional pertinent ROS otherwise unremarkable.     EKGs/Labs/Other Studies Reviewed:    The following studies were reviewed today: Echo 05/15/19 1. Left ventricular ejection fraction, by visual estimation, is 60 to  65%. The left ventricle has normal function. There is mildly increased  left ventricular hypertrophy.  2. Left ventricular diastolic parameters are consistent with Grade I  diastolic dysfunction (impaired relaxation).  3. The left ventricle has no regional wall motion abnormalities.  4. Global right ventricle has normal systolic function.The right  ventricular size is normal. No increase in right ventricular wall  thickness.  5. Left atrial size was mildly dilated.  6. Right atrial size was moderately dilated.  7. The mitral valve is normal in structure. Mild mitral valve  regurgitation. No evidence of mitral stenosis.  8. The tricuspid valve is normal in structure.  9. The tricuspid valve is normal in structure. Tricuspid valve  regurgitation is moderate.  10. The aortic valve is normal in structure. Aortic valve regurgitation is  not visualized. No evidence of aortic valve sclerosis or stenosis.  11. The pulmonic valve was normal in structure. Pulmonic valve    regurgitation is trivial.  12. Moderately elevated pulmonary artery systolic pressure.  13. The tricuspid regurgitant velocity is 2.87 m/s, and with an assumed  right atrial pressure of 8 mmHg, the estimated right ventricular systolic  pressure is moderately elevated at 40.9 mmHg.  14. The inferior vena cava is normal in size with greater than 50%  respiratory variability, suggesting right atrial pressure of 3 mmHg.  15. The average left ventricular global longitudinal strain is -17.5 %.   Lexiscan myoview 07/15/19  Nuclear stress EF: 72%. The left ventricular ejection fraction is hyperdynamic (>65%).  There was no ST segment deviation noted during stress.  This is a low risk study. There is no evidence of ischemia or previous infarction.  The study is normal.  EKG:  EKG is personally reviewed.  The ekg ordered 06/02/19 demonstrates NSR  Recent Labs: 05/05/2019: BUN 16; Creatinine, Ser 0.56; Potassium 4.0; Sodium 142  Recent Lipid Panel No results found for: CHOL, TRIG, HDL, CHOLHDL, VLDL, LDLCALC, LDLDIRECT  Physical Exam:    VS:    Ht 5' (1.524 m)   Wt 138 lb (62.6 kg)   LMP  (LMP Unknown)   BMI 26.95 kg/m     Wt Readings from Last 3 Encounters:  09/17/19 138 lb (62.6 kg)  08/11/19 148 lb 12.8 oz (67.5 kg)  07/15/19 161 lb (73 kg)    Speaking comfortably on the phone, no audible wheezing In no acute distress Alert and oriented Normal affect Normal speech  ASSESSMENT:    1. Pulmonary hypertension (HCC)   2. Essential hypertension   3. SOB (shortness of breath)   4. Pre-procedure lab exam   5. Mixed hyperlipidemia    PLAN:    Interstitial lung disease, with chronic shortness of breath, echo supports pulmonary hypertension of unclear etiology -lexiscan myoview normal -echo with PASP 40 mmHg, consistent with mild pulmonary hypertension -followed by Dr. Isaiah Serge. Has requested right heart cath Risks and benefits of cardiac catheterization have been discussed with the  patient.  These include bleeding, infection, kidney damage, stroke, heart attack, death.  The patient understands these risks and is willing to proceed. -will order labs, ECG to be done prior to RHC  Hypertension: no blood pressure reading today -continue HCTZ  Hyperlipidemia: no known CAD -continue simvastatin  -lipid numbers as below  Cardiac risk counseling and prevention recommendations: -recommend heart healthy/Mediterranean diet, with whole grains, fruits, vegetable, fish, lean meats, nuts, and olive oil. Limit salt. -recommend moderate walking, 3-5 times/week for 30-50 minutes each session. Aim for at least 150 minutes.week. Goal should be pace of 3 miles/hours, or walking 1.5 miles in 30 minutes -recommend avoidance of tobacco products. Avoid excess alcohol. -Additional risk factor control:  -Diabetes risk: A1c is not available, but told she will be screened as her mom was diagnosed with diabetes. Was told her sugar level was elevated with prednisone  -Lipids: Tchol 161, TG 151, HDL 38, LDL 96.  -Blood pressure control: as above  -Weight: BMI 26  Plan for follow up: 6 mos  Total time of encounter: total time of  15 minutes spent in direct patient care over the phone.  Jodelle Red, MD, PhD [place]  [facility]   Medication Adjustments/Labs and Tests Ordered: Current medicines are reviewed at length with the patient today.  Concerns regarding medicines are outlined above.  No orders of the defined types were placed in this encounter.  No orders of the defined types were placed in this encounter.   Patient Instructions  Medication Instructions:  Your Physician recommend you continue on your current medication as directed.    *If you need a refill on your cardiac medications before your next appointment, please call your pharmacy*   Lab Work: Your physician recommends that you return for lab work Friday 09/19/19.  If you have labs (blood work) drawn  today and your tests are completely normal, you will receive your results only by: [name] MyChart Message (if you have MyChart) OR . A paper copy in the mail If you have any lab test that is abnormal or we need to change your treatment, we will call you to review the results.   Testing/Procedures: Your physician has requested that you have a cardiac catheterization. Cardiac catheterization is used to diagnose and/or treat various heart conditions. Doctors may recommend this procedure for a number of different reasons. The most common reason is to evaluate chest pain. Chest pain can be a symptom of coronary artery disease (CAD), and cardiac catheterization can show whether plaque is narrowing or blocking your heart's  arteries. This procedure is also used to evaluate the valves, as well as measure the blood flow and oxygen levels in different parts of your heart. For further information please visit [email or web address] Please follow instruction sheet, as given. [facility]    Follow-Up: At [facility], you and your health needs are our priority.  As part of our continuing mission to provide you with exceptional heart care, we have created designated Provider Care Teams.  These Care Teams include your primary Cardiologist (physician) and Advanced Practice Providers (APPs -  Physician Assistants and Nurse Practitioners) who all work together to provide you with the care you need, when you need it.  We recommend signing up for the patient portal called "MyChart".  Sign up information is provided on this After Visit Summary.  MyChart is used to connect with patients for Virtual Visits (Telemedicine).  Patients are able to view lab/test results, encounter notes, upcoming appointments, etc.  Non-urgent messages can be sent to your provider as well.   To learn more about what you can do with MyChart, go to [email or web address].    Your next appointment:   6 months  The format for your next  appointment:   In Person  Provider:   Jodelle Red, MD   EKG appointment- Friday 09/19/19 @ 2 pm      [facility] CARDIOVASCULAR DIVISION [facility] [address] [place] [address] Dept: [phone] Loc: [phone]  Ruth Brown  09/17/2019  You are scheduled for a Cardiac Catheterization on Tuesday, June 15 with Dr. Lance Muss.  1. Please arrive at the [facility] (Main Entrance A) at [facility]: [address] [place], [place] [address] at 5:30 AM (This time is two hours before your procedure to ensure your preparation). Free valet parking service is available.   Special note: Every effort is made to have your procedure done on time. Please understand that emergencies sometimes delay scheduled procedures.  2. Diet: Do not eat solid foods after midnight.  The patient may have clear liquids until 5am upon the day of the procedure.  3. Labs: You will need to have blood drawn Friday 09/19/19 ( CBC, BMP)  4. Medication instructions in preparation for your procedure:   Contrast Allergy: No  Hold Hydrochlorothiazide 12.5 mg the morning of procedure  On the morning of your procedure, take your Aspirin and any morning medicines NOT listed above.  You may use sips of water.  5. Plan for one night stay--bring personal belongings. 6. Bring a current list of your medications and current insurance cards. 7. You MUST have a responsible person to drive you home. 8. Someone MUST be with you the first 24 hours after you arrive home or your discharge will be delayed. 9. Please wear clothes that are easy to get on and off and wear slip-on shoes.  Thank you for allowing [place] to care for you!   -- [facility]     Signed, Jodelle Red, MD PhD 09/17/2019     [facility]

## 2019-09-17 NOTE — H&P (View-Only) (Signed)
Virtual Visit via Telephone Note   This visit type was conducted due to national recommendations for restrictions regarding the COVID-19 Pandemic (e.g. social distancing) in an effort to limit this patient's exposure and mitigate transmission in our community.  Due to her co-morbid illnesses, this patient is at least at moderate risk for complications without adequate follow up.  This format is felt to be most appropriate for this patient at this time.  The patient did not have access to video technology/had technical difficulties with video requiring transitioning to audio format only (telephone).  All issues noted in this document were discussed and addressed.  No physical exam could be performed with this format.  Please refer to the patient's chart for her  consent to telehealth for [facility].   The patient was identified using 2 identifiers.  Date:  09/17/2019   ID:  Ruth Brown, DOB October 27, 1955, MRN [id number]  PCP:  [name], MD  Cardiologist:  Buford Dresser, MD  Referring MD: [name], MD   CC: follow up  History of Present Illness:    Ruth Brown is a 64 y.o. female with a hx of chronic bronchitis and asthma, hypertension, hyperlipidemia. She was seen 05/05/19 as a new consult at the request of [name], MD for the evaluation and management of chest tightness.  Cardiac history: first seen 05/05/19 for chest tightness related to shortness of breath. We planned for a CT coronary to evaluate further, but this could not be completed despite two attempts (3/3 and 3/24) due to inability to get heart rate to goal on 3/3 and hypotension on 3/24. Test was changed to a lexiscan myoview, which was normal. Echocardiogram noted elevated right sided filling pressures (RVSP 40). Follows with pulmonology for her lung disease.   Today: She was referred back to cardiology to facilitate right heart cath by Dr. Vaughan Browner. We discussed right heart cath at length.  Reviewed the indications, how it is performed, risks and benefits. She appreciated this, is willing to go forward but wanted to talk about what the test is first.  Overall feels like she is doing very well. Denies chest pain, PND, orthopnea, LE edema or unexpected weight gain. No syncope or palpitations.  Past Medical History:  Diagnosis Date  . Anxiety   . Arthritis   . Depression   . Gallstone   . HTN (hypertension)   . Hyperlipidemia     Past Surgical History:  Procedure Laterality Date  . CESAREAN SECTION      Current Medications: Current Outpatient Medications on File Prior to Visit  Medication Sig  . albuterol (PROVENTIL HFA;VENTOLIN HFA) 108 (90 Base) MCG/ACT inhaler Inhale 2 puffs into the lungs every 6 (six) hours as needed for wheezing or shortness of breath.  [name] albuterol (PROVENTIL) (2.5 MG/3ML) 0.083% nebulizer solution Take 3 mLs (2.5 mg total) by nebulization every 6 (six) hours as needed for wheezing or shortness of breath.  . azaTHIOprine (IMURAN) 50 MG tablet TAKE 1 TABLET BY MOUTH EVERY DAY  . busPIRone (BUSPAR) 10 MG tablet Take 10 mg by mouth daily.  . celecoxib (CELEBREX) 100 MG capsule Take by mouth.  . Cholecalciferol (VITAMIN D3) 1.25 MG (50000 UT) TABS Take by mouth once a week.  . citalopram (CELEXA) 20 MG tablet Take 1 tablet (20 mg total) by mouth daily.  . fluticasone (FLONASE) 50 MCG/ACT nasal spray Place 2 sprays into both nostrils daily.  . folic acid (FOLVITE) 1 MG tablet Take  1 mg by mouth daily.  . hydrochlorothiazide (HYDRODIURIL) 12.5 MG tablet Take by mouth.  . inFLIXimab (REMICADE) 100 MG injection Inject into the vein.  . Misc. Devices MISC Mask and tubing for SierraNeb 2 - Aero flow  . omeprazole (PRILOSEC) 20 MG capsule Take 20 mg by mouth daily.  . predniSONE (DELTASONE) 5 MG tablet Take 5-10 mg by mouth daily.  . simvastatin (ZOCOR) 20 MG tablet Take 20 mg by mouth every other day.   . vitamin B-12 (CYANOCOBALAMIN) 100 MCG tablet Take  100 mcg by mouth daily.   No current facility-administered medications on file prior to visit.     Allergies:   Patient has no known allergies.   Social History   Tobacco Use  . Smoking status: Never Smoker  . Smokeless tobacco: Never Used  Substance Use Topics  . Alcohol use: No    Alcohol/week: 0.0 standard drinks  . Drug use: No    Family History: family history includes Alcohol abuse in her mother; Anxiety disorder in her maternal aunt and mother; COPD in her father; Depression in her maternal aunt and mother; Heart disease in her father; Hypertension in her sister; Prostate cancer in her brother. father had COPD, heart disease  ROS:   Please see the history of present illness.  Additional pertinent ROS otherwise unremarkable.     EKGs/Labs/Other Studies Reviewed:    The following studies were reviewed today: Echo 05/15/19 1. Left ventricular ejection fraction, by visual estimation, is 60 to  65%. The left ventricle has normal function. There is mildly increased  left ventricular hypertrophy.  2. Left ventricular diastolic parameters are consistent with Grade I  diastolic dysfunction (impaired relaxation).  3. The left ventricle has no regional wall motion abnormalities.  4. Global right ventricle has normal systolic function.The right  ventricular size is normal. No increase in right ventricular wall  thickness.  5. Left atrial size was mildly dilated.  6. Right atrial size was moderately dilated.  7. The mitral valve is normal in structure. Mild mitral valve  regurgitation. No evidence of mitral stenosis.  8. The tricuspid valve is normal in structure.  9. The tricuspid valve is normal in structure. Tricuspid valve  regurgitation is moderate.  10. The aortic valve is normal in structure. Aortic valve regurgitation is  not visualized. No evidence of aortic valve sclerosis or stenosis.  11. The pulmonic valve was normal in structure. Pulmonic valve    regurgitation is trivial.  12. Moderately elevated pulmonary artery systolic pressure.  13. The tricuspid regurgitant velocity is 2.87 m/s, and with an assumed  right atrial pressure of 8 mmHg, the estimated right ventricular systolic  pressure is moderately elevated at 40.9 mmHg.  14. The inferior vena cava is normal in size with greater than 50%  respiratory variability, suggesting right atrial pressure of 3 mmHg.  15. The average left ventricular global longitudinal strain is -17.5 %.   Lexiscan myoview 07/15/19  Nuclear stress EF: 72%. The left ventricular ejection fraction is hyperdynamic (>65%).  There was no ST segment deviation noted during stress.  This is a low risk study. There is no evidence of ischemia or previous infarction.  The study is normal.  EKG:  EKG is personally reviewed.  The ekg ordered 06/02/19 demonstrates NSR  Recent Labs: 05/05/2019: BUN 16; Creatinine, Ser 0.56; Potassium 4.0; Sodium 142  Recent Lipid Panel No results found for: CHOL, TRIG, HDL, CHOLHDL, VLDL, LDLCALC, LDLDIRECT  Physical Exam:    VS:  Ht 5' (1.524 m)   Wt 138 lb (62.6 kg)   LMP  (LMP Unknown)   BMI 26.95 kg/m     Wt Readings from Last 3 Encounters:  09/17/19 138 lb (62.6 kg)  08/11/19 148 lb 12.8 oz (67.5 kg)  07/15/19 161 lb (73 kg)    Speaking comfortably on the phone, no audible wheezing In no acute distress Alert and oriented Normal affect Normal speech  ASSESSMENT:    1. Pulmonary hypertension (HCC)   2. Essential hypertension   3. SOB (shortness of breath)   4. Pre-procedure lab exam   5. Mixed hyperlipidemia    PLAN:    Interstitial lung disease, with chronic shortness of breath, echo supports pulmonary hypertension of unclear etiology -lexiscan myoview normal -echo with PASP 40 mmHg, consistent with mild pulmonary hypertension -followed by Dr. Isaiah Serge. Has requested right heart cath Risks and benefits of cardiac catheterization have been discussed with the  patient.  These include bleeding, infection, kidney damage, stroke, heart attack, death.  The patient understands these risks and is willing to proceed. -will order labs, ECG to be done prior to RHC  Hypertension: no blood pressure reading today -continue HCTZ  Hyperlipidemia: no known CAD -continue simvastatin  -lipid numbers as below  Cardiac risk counseling and prevention recommendations: -recommend heart healthy/Mediterranean diet, with whole grains, fruits, vegetable, fish, lean meats, nuts, and olive oil. Limit salt. -recommend moderate walking, 3-5 times/week for 30-50 minutes each session. Aim for at least 150 minutes.week. Goal should be pace of 3 miles/hours, or walking 1.5 miles in 30 minutes -recommend avoidance of tobacco products. Avoid excess alcohol. -Additional risk factor control:  -Diabetes risk: A1c is not available, but told she will be screened as her mom was diagnosed with diabetes. Was told her sugar level was elevated with prednisone  -Lipids: Tchol 161, TG 151, HDL 38, LDL 96.  -Blood pressure control: as above  -Weight: BMI 26  Plan for follow up: 6 mos  Total time of encounter: total time of  15 minutes spent in direct patient care over the phone.  Jodelle Red, MD, PhD [place]  [facility]   Medication Adjustments/Labs and Tests Ordered: Current medicines are reviewed at length with the patient today.  Concerns regarding medicines are outlined above.  No orders of the defined types were placed in this encounter.  No orders of the defined types were placed in this encounter.   Patient Instructions  Medication Instructions:  Your Physician recommend you continue on your current medication as directed.    *If you need a refill on your cardiac medications before your next appointment, please call your pharmacy*   Lab Work: Your physician recommends that you return for lab work Friday 09/19/19.  If you have labs (blood work) drawn  today and your tests are completely normal, you will receive your results only by: [name] MyChart Message (if you have MyChart) OR . A paper copy in the mail If you have any lab test that is abnormal or we need to change your treatment, we will call you to review the results.   Testing/Procedures: Your physician has requested that you have a cardiac catheterization. Cardiac catheterization is used to diagnose and/or treat various heart conditions. Doctors may recommend this procedure for a number of different reasons. The most common reason is to evaluate chest pain. Chest pain can be a symptom of coronary artery disease (CAD), and cardiac catheterization can show whether plaque is narrowing or blocking your heart's  arteries. This procedure is also used to evaluate the valves, as well as measure the blood flow and oxygen levels in different parts of your heart. For further information please visit [email or web address] Please follow instruction sheet, as given. [facility]    Follow-Up: At [facility], you and your health needs are our priority.  As part of our continuing mission to provide you with exceptional heart care, we have created designated Provider Care Teams.  These Care Teams include your primary Cardiologist (physician) and Advanced Practice Providers (APPs -  Physician Assistants and Nurse Practitioners) who all work together to provide you with the care you need, when you need it.  We recommend signing up for the patient portal called "MyChart".  Sign up information is provided on this After Visit Summary.  MyChart is used to connect with patients for Virtual Visits (Telemedicine).  Patients are able to view lab/test results, encounter notes, upcoming appointments, etc.  Non-urgent messages can be sent to your provider as well.   To learn more about what you can do with MyChart, go to [email or web address].    Your next appointment:   6 months  The format for your next  appointment:   In Person  Provider:   Jodelle Red, MD   EKG appointment- Friday 09/19/19 @ 2 pm      [facility] CARDIOVASCULAR DIVISION [facility] [address] [place] [address] Dept: [phone] Loc: [phone]  MADISIN HASAN  09/17/2019  You are scheduled for a Cardiac Catheterization on Tuesday, June 15 with Dr. Lance Muss.  1. Please arrive at the [facility] (Main Entrance A) at [facility]: [address] [place], [place] [address] at 5:30 AM (This time is two hours before your procedure to ensure your preparation). Free valet parking service is available.   Special note: Every effort is made to have your procedure done on time. Please understand that emergencies sometimes delay scheduled procedures.  2. Diet: Do not eat solid foods after midnight.  The patient may have clear liquids until 5am upon the day of the procedure.  3. Labs: You will need to have blood drawn Friday 09/19/19 ( CBC, BMP)  4. Medication instructions in preparation for your procedure:   Contrast Allergy: No  Hold Hydrochlorothiazide 12.5 mg the morning of procedure  On the morning of your procedure, take your Aspirin and any morning medicines NOT listed above.  You may use sips of water.  5. Plan for one night stay--bring personal belongings. 6. Bring a current list of your medications and current insurance cards. 7. You MUST have a responsible person to drive you home. 8. Someone MUST be with you the first 24 hours after you arrive home or your discharge will be delayed. 9. Please wear clothes that are easy to get on and off and wear slip-on shoes.  Thank you for allowing [place] to care for you!   -- [place] [facility]     Signed, Jodelle Red, MD PhD 09/17/2019     [facility]

## 2019-09-17 NOTE — Patient Instructions (Signed)
Medication Instructions:  Your Physician recommend you continue on your current medication as directed.    *If you need a refill on your cardiac medications before your next appointment, please call your pharmacy*   Lab Work: Your physician recommends that you return for lab work Friday 09/19/19.  If you have labs (blood work) drawn today and your tests are completely normal, you will receive your results only by: [name] MyChart Message (if you have MyChart) OR . A paper copy in the mail If you have any lab test that is abnormal or we need to change your treatment, we will call you to review the results.   Testing/Procedures: Your physician has requested that you have a cardiac catheterization. Cardiac catheterization is used to diagnose and/or treat various heart conditions. Doctors may recommend this procedure for a number of different reasons. The most common reason is to evaluate chest pain. Chest pain can be a symptom of coronary artery disease (CAD), and cardiac catheterization can show whether plaque is narrowing or blocking your heart's arteries. This procedure is also used to evaluate the valves, as well as measure the blood flow and oxygen levels in different parts of your heart. For further information please visit [email or web address] Please follow instruction sheet, as given. [facility]    Follow-Up: At [facility], you and your health needs are our priority.  As part of our continuing mission to provide you with exceptional heart care, we have created designated Provider Care Teams.  These Care Teams include your primary Cardiologist (physician) and Advanced Practice Providers (APPs -  Physician Assistants and Nurse Practitioners) who all work together to provide you with the care you need, when you need it.  We recommend signing up for the patient portal called "MyChart".  Sign up information is provided on this After Visit Summary.  MyChart is used to connect with  patients for Virtual Visits (Telemedicine).  Patients are able to view lab/test results, encounter notes, upcoming appointments, etc.  Non-urgent messages can be sent to your provider as well.   To learn more about what you can do with MyChart, go to [email or web address].    Your next appointment:   6 months  The format for your next appointment:   In Person  Provider:   Jodelle Red, MD   EKG appointment- Friday 09/19/19 @ 2 pm      [facility] CARDIOVASCULAR DIVISION [facility] [address] [address] [place] [address] Dept: [phone] Loc: [phone]  ALYONA ROMACK  09/17/2019  You are scheduled for a Cardiac Catheterization on Tuesday, June 15 with Dr. Lance Muss.  1. Please arrive at the [facility] (Main Entrance A) at [facility]: [address] [place], [place] [address] at 5:30 AM (This time is two hours before your procedure to ensure your preparation). Free valet parking service is available.   Special note: Every effort is made to have your procedure done on time. Please understand that emergencies sometimes delay scheduled procedures.  2. Diet: Do not eat solid foods after midnight.  The patient may have clear liquids until 5am upon the day of the procedure.  3. Labs: You will need to have blood drawn Friday 09/19/19 ( CBC, BMP)  4. Medication instructions in preparation for your procedure:   Contrast Allergy: No  Hold Hydrochlorothiazide 12.5 mg the morning of procedure  On the morning of your procedure, take your Aspirin and any morning medicines NOT listed above.  You  may use sips of water.  5. Plan for one night stay--bring personal belongings. 6. Bring a current list of your medications and current insurance cards. 7. You MUST have a responsible person to drive you home. 8. Someone MUST be with you the first 24 hours after you arrive home or your discharge will be delayed. 9.  Please wear clothes that are easy to get on and off and wear slip-on shoes.  Thank you for allowing [place] to care for you!   -- [place] [facility]

## 2019-09-19 ENCOUNTER — Other Ambulatory Visit: Payer: Self-pay

## 2019-09-19 ENCOUNTER — Other Ambulatory Visit (INDEPENDENT_AMBULATORY_CARE_PROVIDER_SITE_OTHER): Payer: 59

## 2019-09-19 ENCOUNTER — Ambulatory Visit (INDEPENDENT_AMBULATORY_CARE_PROVIDER_SITE_OTHER): Payer: 59

## 2019-09-19 DIAGNOSIS — Z01812 Encounter for preprocedural laboratory examination: Secondary | ICD-10-CM | POA: Diagnosis not present

## 2019-09-19 DIAGNOSIS — I272 Pulmonary hypertension, unspecified: Secondary | ICD-10-CM

## 2019-09-19 DIAGNOSIS — J849 Interstitial pulmonary disease, unspecified: Secondary | ICD-10-CM | POA: Diagnosis not present

## 2019-09-19 LAB — COMPREHENSIVE METABOLIC PANEL
ALT: 11 U/L (ref 0–35)
AST: 18 U/L (ref 0–37)
Albumin: 3.5 g/dL (ref 3.5–5.2)
Alkaline Phosphatase: 64 U/L (ref 39–117)
BUN: 16 mg/dL (ref 6–23)
CO2: 37 mEq/L — ABNORMAL HIGH (ref 19–32)
Calcium: 9.5 mg/dL (ref 8.4–10.5)
Chloride: 99 mEq/L (ref 96–112)
Creatinine, Ser: 0.48 mg/dL (ref 0.40–1.20)
GFR: 158.38 mL/min (ref >60.00)
Glucose, Bld: 99 mg/dL (ref 70–99)
Potassium: 3.6 mEq/L (ref 3.5–5.1)
Sodium: 138 mEq/L (ref 135–145)
Total Bilirubin: 0.2 mg/dL (ref 0.2–1.2)
Total Protein: 7.4 g/dL (ref 6.0–8.3)

## 2019-09-19 LAB — CBC WITH DIFFERENTIAL/PLATELET
Basophils Absolute: 0 10*3/uL (ref 0.0–0.1)
Basophils Relative: 0.6 % (ref 0.0–3.0)
Eosinophils Absolute: 0.2 10*3/uL (ref 0.0–0.7)
Eosinophils Relative: 3.3 % (ref 0.0–5.0)
HCT: 33.4 % — ABNORMAL LOW (ref 36.0–46.0)
Hemoglobin: 10.8 g/dL — ABNORMAL LOW (ref 12.0–15.0)
Lymphocytes Relative: 16.9 % (ref 12.0–46.0)
Lymphs Abs: 1 10*3/uL (ref 0.7–4.0)
MCHC: 32.2 g/dL (ref 30.0–36.0)
MCV: 85.9 fl (ref 78.0–100.0)
Monocytes Absolute: 0.4 10*3/uL (ref 0.1–1.0)
Monocytes Relative: 6.4 % (ref 3.0–12.0)
Neutro Abs: 4.3 10*3/uL (ref 1.4–7.7)
Neutrophils Relative %: 72.8 % (ref 43.0–77.0)
Platelets: 490 10*3/uL — ABNORMAL HIGH (ref 150.0–400.0)
RBC: 3.89 Mil/uL (ref 3.87–5.11)
RDW: 14.4 % (ref 11.5–15.5)
WBC: 6 10*3/uL (ref 4.0–10.5)

## 2019-09-19 NOTE — Progress Notes (Signed)
1. Reason for visit: EKK prior to cath procedure scheduled for 09/23/19   2. Name of MD requesting visit: Dr. Cristal Deer    3. Assessment and plan per MD: EKG reviewed/signed by Dr. [place] (DOD) and sent to medical record to scan into system

## 2019-09-20 ENCOUNTER — Other Ambulatory Visit (HOSPITAL_COMMUNITY): Payer: 59

## 2019-09-22 ENCOUNTER — Telehealth: Payer: Self-pay | Admitting: *Deleted

## 2019-09-22 ENCOUNTER — Encounter: Payer: Self-pay | Admitting: Cardiology

## 2019-09-22 NOTE — Telephone Encounter (Signed)
Pt contacted pre-right heart catheterization scheduled at [facility] for: Tuesday September 23, 2019 7:30 AM Verified arrival time and place: [facility] Main Entrance A [facility]) at: 5:30 AM   No solid food after midnight prior to cath, clear liquids until 5 AM day of procedure.  Hold: HCTZ-AM of procedure   Except hold medication AM meds can be  taken pre-cath with sips of water   Confirmed patient has responsible adult to drive home post procedure and observe 24 hours after arriving home: yes  You are allowed ONE visitor in the waiting room during your procedure. Both you and your visitor must wear masks.      COVID-19 Pre-Screening Questions:  . In the past 7 to 10 days have you had a cough,  shortness of breath, headache, congestion, fever (100 or greater) body aches, chills, sore throat, or sudden loss of taste or sense of smell? no . In the past 7 to 10 days have you been around anyone with known Covid 19? no    Reviewed procedure/mask/visitor instructions, COVID-19 screening questions with patient.

## 2019-09-23 ENCOUNTER — Encounter (HOSPITAL_COMMUNITY)
Admission: RE | Disposition: A | Discharge status: Home / Self Care | Payer: Self-pay | Source: Home / Self Care | Attending: Interventional Cardiology

## 2019-09-23 ENCOUNTER — Other Ambulatory Visit: Payer: Self-pay

## 2019-09-23 ENCOUNTER — Ambulatory Visit (HOSPITAL_COMMUNITY)
Admission: RE | Admit: 2019-09-23 | Discharge: 2019-09-23 | Disposition: A | Discharge status: Home / Self Care | Payer: 59 | Attending: Interventional Cardiology | Admitting: Interventional Cardiology

## 2019-09-23 DIAGNOSIS — R0789 Other chest pain: Secondary | ICD-10-CM | POA: Diagnosis not present

## 2019-09-23 DIAGNOSIS — E782 Mixed hyperlipidemia: Secondary | ICD-10-CM | POA: Insufficient documentation

## 2019-09-23 DIAGNOSIS — Z79899 Other long term (current) drug therapy: Secondary | ICD-10-CM | POA: Insufficient documentation

## 2019-09-23 DIAGNOSIS — I272 Pulmonary hypertension, unspecified: Secondary | ICD-10-CM | POA: Insufficient documentation

## 2019-09-23 DIAGNOSIS — M1990 Unspecified osteoarthritis, unspecified site: Secondary | ICD-10-CM | POA: Diagnosis not present

## 2019-09-23 DIAGNOSIS — Z8249 Family history of ischemic heart disease and other diseases of the circulatory system: Secondary | ICD-10-CM | POA: Diagnosis not present

## 2019-09-23 DIAGNOSIS — R931 Abnormal findings on diagnostic imaging of heart and coronary circulation: Secondary | ICD-10-CM | POA: Diagnosis present

## 2019-09-23 DIAGNOSIS — F419 Anxiety disorder, unspecified: Secondary | ICD-10-CM | POA: Diagnosis not present

## 2019-09-23 DIAGNOSIS — F329 Major depressive disorder, single episode, unspecified: Secondary | ICD-10-CM | POA: Insufficient documentation

## 2019-09-23 DIAGNOSIS — Z825 Family history of asthma and other chronic lower respiratory diseases: Secondary | ICD-10-CM | POA: Insufficient documentation

## 2019-09-23 DIAGNOSIS — R0602 Shortness of breath: Secondary | ICD-10-CM | POA: Diagnosis not present

## 2019-09-23 DIAGNOSIS — J45909 Unspecified asthma, uncomplicated: Secondary | ICD-10-CM | POA: Diagnosis not present

## 2019-09-23 DIAGNOSIS — I10 Essential (primary) hypertension: Secondary | ICD-10-CM | POA: Diagnosis not present

## 2019-09-23 HISTORY — PX: RIGHT HEART CATH: CATH118263

## 2019-09-23 LAB — POCT I-STAT EG7
Acid-Base Excess: 7 mmol/L — ABNORMAL HIGH (ref 0.0–2.0)
Acid-Base Excess: 7 mmol/L — ABNORMAL HIGH (ref 0.0–2.0)
Bicarbonate: 33.5 mmol/L — ABNORMAL HIGH (ref 20.0–28.0)
Bicarbonate: 33.5 mmol/L — ABNORMAL HIGH (ref 20.0–28.0)
Calcium, Ion: 1.33 mmol/L (ref 1.15–1.40)
Calcium, Ion: 1.3 mmol/L (ref 1.15–1.40)
HCT: 30 % — ABNORMAL LOW (ref 36.0–46.0)
HCT: 30 % — ABNORMAL LOW (ref 36.0–46.0)
Hemoglobin: 10.2 g/dL — ABNORMAL LOW (ref 12.0–15.0)
Hemoglobin: 10.2 g/dL — ABNORMAL LOW (ref 12.0–15.0)
O2 Saturation: 69 %
O2 Saturation: 73 %
Potassium: 3.2 mmol/L — ABNORMAL LOW (ref 3.5–5.1)
Potassium: 3.2 mmol/L — ABNORMAL LOW (ref 3.5–5.1)
Sodium: 145 mmol/L (ref 135–145)
Sodium: 144 mmol/L (ref 135–145)
TCO2: 35 mmol/L — ABNORMAL HIGH (ref 22–32)
TCO2: 35 mmol/L — ABNORMAL HIGH (ref 22–32)
pCO2, Ven: 57.3 mmHg (ref 44.0–60.0)
pCO2, Ven: 54.9 mmHg (ref 44.0–60.0)
pH, Ven: 7.375 (ref 7.250–7.430)
pH, Ven: 7.394 (ref 7.250–7.430)
pO2, Ven: 38 mmHg (ref 32.0–45.0)
pO2, Ven: 40 mmHg (ref 32.0–45.0)

## 2019-09-23 SURGERY — RIGHT HEART CATH

## 2019-09-23 MED ORDER — LABETALOL HCL 5 MG/ML IV SOLN: 10.0000 mg | INTRAVENOUS | Status: DC | PRN

## 2019-09-23 MED ORDER — LIDOCAINE HCL (PF) 1 % IJ SOLN
INTRAMUSCULAR | Status: DC | PRN
  Administered 2019-09-23: 2 mL

## 2019-09-23 MED ORDER — SODIUM CHLORIDE 0.9 % IV SOLN: INTRAVENOUS | Status: DC

## 2019-09-23 MED ORDER — MIDAZOLAM HCL 2 MG/2ML IJ SOLN
INTRAMUSCULAR | Status: DC | PRN
  Administered 2019-09-23: 1 mg via INTRAVENOUS

## 2019-09-23 MED ORDER — HEPARIN (PORCINE) IN NACL 1000-0.9 UT/500ML-% IV SOLN
INTRAVENOUS | Status: AC
  Filled 2019-09-23: qty 500

## 2019-09-23 MED ORDER — SODIUM CHLORIDE 0.9% FLUSH: 3.0000 mL | Freq: Two times a day (BID) | INTRAVENOUS | Status: DC

## 2019-09-23 MED ORDER — SODIUM CHLORIDE 0.9% FLUSH: 3.0000 mL | INTRAVENOUS | Status: DC | PRN

## 2019-09-23 MED ORDER — FENTANYL CITRATE (PF) 100 MCG/2ML IJ SOLN
INTRAMUSCULAR | Status: AC
  Filled 2019-09-23: qty 2

## 2019-09-23 MED ORDER — HYDRALAZINE HCL 20 MG/ML IJ SOLN: 10.0000 mg | INTRAMUSCULAR | Status: DC | PRN

## 2019-09-23 MED ORDER — SODIUM CHLORIDE 0.9 % IV SOLN: 250.0000 mL | INTRAVENOUS | Status: DC | PRN

## 2019-09-23 MED ORDER — ACETAMINOPHEN 325 MG PO TABS: 650.0000 mg | ORAL_TABLET | ORAL | Status: DC | PRN

## 2019-09-23 MED ORDER — FENTANYL CITRATE (PF) 100 MCG/2ML IJ SOLN
INTRAMUSCULAR | Status: DC | PRN
  Administered 2019-09-23: 25 ug via INTRAVENOUS

## 2019-09-23 MED ORDER — LIDOCAINE HCL (PF) 1 % IJ SOLN
INTRAMUSCULAR | Status: AC
  Filled 2019-09-23: qty 30

## 2019-09-23 MED ORDER — ONDANSETRON HCL 4 MG/2ML IJ SOLN: 4.0000 mg | Freq: Four times a day (QID) | INTRAMUSCULAR | Status: DC | PRN

## 2019-09-23 MED ORDER — HEPARIN (PORCINE) IN NACL 1000-0.9 UT/500ML-% IV SOLN
INTRAVENOUS | Status: DC | PRN
  Administered 2019-09-23: 500 mL

## 2019-09-23 MED ORDER — MIDAZOLAM HCL 2 MG/2ML IJ SOLN
INTRAMUSCULAR | Status: AC
  Filled 2019-09-23: qty 2

## 2019-09-23 SURGICAL SUPPLY — 7 items
CATH BALLN WEDGE 5F 110CM (CATHETERS) ×2 IMPLANT
KIT HEART LEFT (KITS) IMPLANT
SHEATH GLIDE SLENDER 4/5FR (SHEATH) ×2 IMPLANT
TRANSDUCER W/STOPCOCK (MISCELLANEOUS) ×2 IMPLANT
TRAY CARDIAC CATHETERIZATION (CUSTOM PROCEDURE TRAY) ×2 IMPLANT
TUBING CIL FLEX 10 FLL-RA (TUBING) ×2 IMPLANT
WIRE EMERALD 3MM-J .025X260CM (WIRE) ×2 IMPLANT

## 2019-09-23 NOTE — Interval H&P Note (Signed)
History and Physical Interval Note:  09/23/2019 7:30 AM  Ruth Brown  has presented today for surgery, with the diagnosis of hp.  The various methods of treatment have been discussed with the patient and family. After consideration of risks, benefits and other options for treatment, the patient has consented to  Procedure(s): RIGHT HEART CATH (N/A) as a surgical intervention.  The patient's history has been reviewed, patient examined, no change in status, stable for surgery.  I have reviewed the patient's chart and labs.  Questions were answered to the patient's satisfaction.     Lance Muss

## 2019-09-23 NOTE — Discharge Instructions (Signed)
Venogram A venogram, or venography, is a procedure that uses an X-ray and dye (contrast) to examine how well the veins work and how blood flows through them. Contrast helps the veins show up on X-rays. A venogram may be done:  To evaluate abnormalities in the vein.  To identify clots within veins, such as deep vein thrombosis (DVT).  To map out the veins that might be needed for another procedure. Tell a health care provider about:  Any allergies you have, especially to medicines, shellfish, iodine, and contrast.  All medicines you are taking, including vitamins, herbs, eye drops, creams, and over-the-counter medicines.  Any problems you or family members have had with anesthetic medicines.  Any blood disorders you have.  Any surgeries you have had and any complications that occurred.  Any medical conditions you have.  Whether you are pregnant, may be pregnant, or are breastfeeding.  Any history of smoking or tobacco use. What are the risks? Generally, this is a safe procedure. However, problems may occur, including:  Infection.  Bleeding.  Blood clots.  Allergic reaction to medicines or contrast.  Damage to other structures or organs.  Kidney problems.  Increased risk of cancer. Being exposed to too much radiation over a lifetime can increase the risk of cancer. The risk is small. What happens before the procedure? Medicines Ask your health care provider about:  Changing or stopping your regular medicines. This is especially important if you are taking diabetes medicines or blood thinners.  Taking medicines such as aspirin and ibuprofen. These medicines can thin your blood. Do not take these medicines unless your health care provider tells you to take them.  Taking over-the-counter medicines, vitamins, herbs, and supplements. General instructions  Follow instructions from your health care provider about eating or drinking restrictions.  You may have blood tests  to check how well your kidneys and liver are working and how well your blood can clot.  Plan to have someone take you home from the hospital or clinic. What happens during the procedure?   An IV will be inserted into one of your veins.  You may be given a medicine to help you relax (sedative).  You will lie down on an X-ray table. The table may be tilted in different directions during the procedure to help the contrast move throughout your body. Safety straps will keep you secure if the table is tilted.  If veins in your arm or leg will be examined, a band may be wrapped around that arm or leg to keep the veins full of blood. This may cause your arm or leg to feel numb.  The contrast will be injected into your IV. You may have a hot, flushed feeling as it moves throughout your body. You may also have a metallic taste in your mouth. Both of these sensations will go away after the test is complete.  You may be asked to lie in different positions or place your legs or arms in different positions.  At the end of the procedure, you may be given IV fluids to help wash or flush the contrast out of your veins.  The IV will be removed, and pressure will be applied to the IV site to prevent bleeding. A bandage (dressing) may be applied to the IV site. The exact procedure may vary among health care providers and hospitals. What can I expect after the procedure?  Your blood pressure, heart rate, breathing rate, and blood oxygen level will be monitored until you   leave the hospital or clinic.  You may be given something to eat and drink.  You may have bruising or mild discomfort in the area where the IV was inserted. Follow these instructions at home: Eating and drinking   Follow instructions from your health care provider about eating or drinking restrictions.  Drink a lot of water for the first several days after the procedure, as directed by your health care provider. This helps to flush the  contrast out of your body. Activity  Rest as told by your health care provider.  Return to your normal activities as told by your health care provider. Ask your health care provider what activities are safe for you.  If you were given a sedative during your procedure, do not drive for 24 hours or until your health care provider approves. General instructions  Check your IV insertion area every day for signs of infection. Check for: ? Redness, swelling, or pain. ? Fluid or blood. ? Warmth. ? Pus or a bad smell.  Take over-the-counter and prescription medicines only as told by your health care provider.  Keep all follow-up visits as told by your health care provider. This is important. Contact a health care provider if:  Your skin becomes itchy or you develop a rash or hives.  You have a fever that does not get better with medicine.  You feel nauseous or you vomit.  You have redness, swelling, or pain around the insertion site.  You have fluid or blood coming from the insertion site.  Your insertion area feels warm to the touch.  You have pus or a bad smell coming from the insertion site. Get help right away if you:  Have shortness of breath or difficulty breathing.  Develop chest pain.  Faint.  Feel very dizzy. These symptoms may represent a serious problem that is an emergency. Do not wait to see if the symptoms will go away. Get medical help right away. Call your local emergency services (911 in the [place] Do not drive yourself to the hospital. Summary  A venogram, or venography, is a procedure that uses an X-ray and contrast dye to check how well the veins work and how blood flows through them.  An IV will be inserted into one of your veins in order to inject the contrast.  During the exam, you will lie on an X-ray table. The table may be tilted in different directions during the procedure to help the contrast move throughout your body. Safety straps will keep you  secure.  After the procedure, you will need to drink a lot of water to help wash or flush the contrast out of your body. This information is not intended to replace advice given to you by your health care provider. Make sure you discuss any questions you have with your health care provider. Document Revised: 11/02/2018 Document Reviewed: 11/02/2018 Elsevier Patient Education  2020 [employer]  

## 2019-09-24 ENCOUNTER — Encounter (HOSPITAL_COMMUNITY): Payer: Self-pay | Admitting: Interventional Cardiology

## 2019-10-06 ENCOUNTER — Telehealth: Payer: Self-pay | Admitting: Pulmonary Disease

## 2019-10-06 NOTE — Telephone Encounter (Signed)
LMTCB x 1 

## 2019-10-07 NOTE — Telephone Encounter (Signed)
Patient called, she thinks she might need oxygen. She was advised to complete a qualifying walk at her next office visit in July. Patient verbalized understanding of plan.

## 2019-10-15 ENCOUNTER — Telehealth: Payer: 59 | Admitting: Cardiology

## 2019-10-22 ENCOUNTER — Ambulatory Visit: Payer: 59 | Admitting: Pulmonary Disease

## 2019-10-22 ENCOUNTER — Other Ambulatory Visit: Payer: Self-pay

## 2019-10-22 ENCOUNTER — Encounter: Payer: Self-pay | Admitting: Pulmonary Disease

## 2019-10-22 ENCOUNTER — Ambulatory Visit (INDEPENDENT_AMBULATORY_CARE_PROVIDER_SITE_OTHER): Payer: 59 | Admitting: Pulmonary Disease

## 2019-10-22 VITALS — BP 122/64 | HR 110 | Temp 98.6°F | Ht 60.0 in | Wt 130.0 lb

## 2019-10-22 DIAGNOSIS — R059 Cough, unspecified: Secondary | ICD-10-CM

## 2019-10-22 DIAGNOSIS — R05 Cough: Secondary | ICD-10-CM | POA: Diagnosis not present

## 2019-10-22 DIAGNOSIS — J849 Interstitial pulmonary disease, unspecified: Secondary | ICD-10-CM

## 2019-10-22 DIAGNOSIS — Z9189 Other specified personal risk factors, not elsewhere classified: Secondary | ICD-10-CM

## 2019-10-22 DIAGNOSIS — Z5181 Encounter for therapeutic drug level monitoring: Secondary | ICD-10-CM

## 2019-10-22 LAB — CBC WITH DIFFERENTIAL/PLATELET
Basophils Absolute: 0.1 10*3/uL (ref 0.0–0.1)
Basophils Relative: 0.4 % (ref 0.0–3.0)
Eosinophils Absolute: 0.1 10*3/uL (ref 0.0–0.7)
Eosinophils Relative: 0.9 % (ref 0.0–5.0)
HCT: 32.5 % — ABNORMAL LOW (ref 36.0–46.0)
Hemoglobin: 10.6 g/dL — ABNORMAL LOW (ref 12.0–15.0)
Lymphocytes Relative: 6.6 % — ABNORMAL LOW (ref 12.0–46.0)
Lymphs Abs: 1 10*3/uL (ref 0.7–4.0)
MCHC: 32.5 g/dL (ref 30.0–36.0)
MCV: 85.7 fl (ref 78.0–100.0)
Monocytes Absolute: 0.7 10*3/uL (ref 0.1–1.0)
Monocytes Relative: 4.8 % (ref 3.0–12.0)
Neutro Abs: 12.8 10*3/uL — ABNORMAL HIGH (ref 1.4–7.7)
Neutrophils Relative %: 87.3 % — ABNORMAL HIGH (ref 43.0–77.0)
Platelets: 685 10*3/uL — ABNORMAL HIGH (ref 150.0–400.0)
RBC: 3.79 Mil/uL — ABNORMAL LOW (ref 3.87–5.11)
RDW: 15.1 % (ref 11.5–15.5)
WBC: 14.7 10*3/uL — ABNORMAL HIGH (ref 4.0–10.5)

## 2019-10-22 LAB — COMPREHENSIVE METABOLIC PANEL
ALT: 7 U/L (ref 0–35)
AST: 17 U/L (ref 0–37)
Albumin: 3.6 g/dL (ref 3.5–5.2)
Alkaline Phosphatase: 85 U/L (ref 39–117)
BUN: 7 mg/dL (ref 6–23)
CO2: 33 mEq/L — ABNORMAL HIGH (ref 19–32)
Calcium: 9.6 mg/dL (ref 8.4–10.5)
Chloride: 94 mEq/L — ABNORMAL LOW (ref 96–112)
Creatinine, Ser: 0.55 mg/dL (ref 0.40–1.20)
GFR: 135.31 mL/min (ref >60.00)
Glucose, Bld: 113 mg/dL — ABNORMAL HIGH (ref 70–99)
Potassium: 3.5 mEq/L (ref 3.5–5.1)
Sodium: 135 mEq/L (ref 135–145)
Total Bilirubin: 0.3 mg/dL (ref 0.2–1.2)
Total Protein: 8.3 g/dL (ref 6.0–8.3)

## 2019-10-22 LAB — HEPATIC FUNCTION PANEL
ALT: 7 U/L (ref 0–35)
AST: 17 U/L (ref 0–37)
Albumin: 3.6 g/dL (ref 3.5–5.2)
Alkaline Phosphatase: 85 U/L (ref 39–117)
Bilirubin, Direct: 0.1 mg/dL (ref 0.0–0.3)
Total Bilirubin: 0.3 mg/dL (ref 0.2–1.2)
Total Protein: 8.3 g/dL (ref 6.0–8.3)

## 2019-10-22 NOTE — Patient Instructions (Addendum)
Check labs today including CBC with differential, comprehensive metabolic panel, QuantiFERON gold and hepatitis panel We will start you on supplemental oxygen Increase azathioprine to 100 mg/day  Follow-up in 2 to 4 weeks.

## 2019-10-22 NOTE — Addendum Note (Signed)
Addended by: Jacquiline Doe on: 10/22/2019 09:21 AM   Modules accepted: Orders

## 2019-10-22 NOTE — Addendum Note (Signed)
Addended by: Demetrio Lapping E on: 10/22/2019 09:18 AM   Modules accepted: Orders

## 2019-10-22 NOTE — Addendum Note (Signed)
Addended by: Jacquiline Doe on: 10/22/2019 10:57 AM   Modules accepted: Orders

## 2019-10-22 NOTE — Progress Notes (Signed)
Ruth Brown    [id number]    1955-10-04  Primary Care Physician:Richter, Marcos Eke, MD  Referring Physician: Macy Mis, MD [address] [place],  [place] [address]  Chief complaint:   ILD Chronic Cough - Improved with treatment of GERD  HPI:  Ruth Brown is a 64 year old female with Rheumatoid Arthritis (+RF, neg CCP, ANA 1:1280) referred by Dr.Byrum after CT on 3/17 showed ILD. Patient first presented to Dr.Byrum 6 months ago for dyspnea and cough. MBS on 10/15/2018 showed patient consistent with GERD, cough improved with treatment of GERD. Patient followed by Elpidio Anis for possible RA.  Patient's RA being treated with Remicade and Prednisone, and reports improvement in joint pain. Her dyspnea is progressively worsened over the last 6 month.   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  Exposures:  Smoking history: Never smoker Travel history: Relevant family history: Father COPD  Interim history: Seen initially in the ILD clinic in early May and started on azathioprine 50 mg/day. At last visit it was supposed to be increased to 100 mg but it appears that she is still continuing at the lower dose States that dyspnea is stable  Qualified for supplemental oxygen today Recently had a right heart catheterization which did not show any evidence of pulmonary hypertension.  Outpatient Encounter Medications as of 10/22/2019  Medication Sig  . acetaminophen (TYLENOL) 500 MG tablet Take 1,000 mg by mouth every 6 (six) hours as needed for mild pain.   [name] albuterol (PROVENTIL) (2.5 MG/3ML) 0.083% nebulizer solution Take 3 mLs (2.5 mg total) by nebulization every 6 (six) hours as needed for wheezing or shortness of breath. (Patient taking differently: Take 2.5 mg by nebulization in the morning and at bedtime. )  . azaTHIOprine (IMURAN) 50 MG tablet TAKE 1 TABLET BY MOUTH EVERY DAY (Patient taking differently: Take 50 mg by mouth daily.  )  . budesonide (PULMICORT) 0.5 MG/2ML nebulizer solution Take 2 mLs by nebulization 2 (two) times daily.  . busPIRone (BUSPAR) 10 MG tablet Take 10 mg by mouth daily.  . citalopram (CELEXA) 20 MG tablet Take 1 tablet (20 mg total) by mouth daily.  . folic acid (FOLVITE) 1 MG tablet Take 1 mg by mouth daily.  . hydrochlorothiazide (HYDRODIURIL) 12.5 MG tablet Take 12.5 mg by mouth daily.   [name] inFLIXimab (REMICADE) 100 MG injection Inject 100 mg into the vein every 30 (thirty) days.   . Misc. Devices MISC Mask and tubing for SierraNeb 2 - Aero flow  . omeprazole (PRILOSEC) 20 MG capsule Take 20 mg by mouth daily as needed (Heartburn).   . simvastatin (ZOCOR) 20 MG tablet Take 20 mg by mouth daily. Takes 2 tabs (40mg ) a day  . vitamin B-12 (CYANOCOBALAMIN) 100 MCG tablet Take 100 mcg by mouth daily.  . Cholecalciferol (VITAMIN D3) 1.25 MG (50000 UT) TABS Take 50,000 Units by mouth once a week. Monday (Patient not taking: Reported on 10/22/2019)   No facility-administered encounter medications on file as of 10/22/2019.   Physical Exam: Gen:      No acute distress HEENT:  EOMI, sclera anicteric Neck:     No masses; no thyromegaly Lungs:    Clear to auscultation bilaterally; normal respiratory effort CV:         Regular rate and rhythm; no murmurs Abd:      + bowel sounds; soft, non-tender; no palpable masses, no distension Ext:  No edema; adequate peripheral perfusion Skin:      Warm and dry; no rash Neuro: alert and oriented x 3 Psych: normal mood and affect  Data Reviewed: Imaging: CT Chest High Resolution 06/25/2019-considerable respiratory motion.  Worsening peripheral septal thickening with reticulation, bronchiectasis.  I have reviewed the images personally.  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  Ao 98%, PA 71%, PA 30/14, mean 21 mm Hg; mean PCWP 9 mm Hg; CO 5.3 L/min; CI 3.3  Assessment: Interstitial lung disease CT consistent with CTD ILD.  Patient currently on prednisone and Remicade for possible rheumatoid arthritis.  RF factor low end of normal and CCP negative. ANA 1:1280 convincing of rheumatologic disease.  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 No ' Print production planner' on exam.  Noted to have elevated CK, aldolase and PL 12 antibody.  She has been started Azathioprine as it helps with joint and lung involvement.  TPMT levels are okay We will check labs and if normal can increase azathioprine to 100 mg Close follow up and consider antifibrotic if there is progression.  Concern for pulmonary HTN Refered to cardiology for right heart cath to better evaluate elevated right heart pressures and possible PHTN.  Thankfully the cardiac catheterization did not show any pulmonary hypertension  Plan/Recommendations: - Continue azathioprine.  Increased dose 100 mg/day - Check metabolic panel, CBC, QuantiFERON gold and hepatitis panel - Start supplemental oxygen with exertion and at night.   Chilton Greathouse MD [facility] Please see [email or web address] for pager details.  10/22/2019, 8:45 AM  CC: Macy Mis, MD

## 2019-10-24 ENCOUNTER — Other Ambulatory Visit: Payer: Self-pay

## 2019-10-24 ENCOUNTER — Telehealth: Payer: Self-pay | Admitting: Pulmonary Disease

## 2019-10-24 DIAGNOSIS — J849 Interstitial pulmonary disease, unspecified: Secondary | ICD-10-CM

## 2019-10-24 LAB — QUANTIFERON-TB GOLD PLUS
Mitogen-NIL: 0.7 IU/mL
NIL: 0.04 IU/mL
QuantiFERON-TB Gold Plus: NEGATIVE
TB1-NIL: <0 IU/mL
TB2-NIL: <0 IU/mL

## 2019-10-24 MED ORDER — AZATHIOPRINE 50 MG PO TABS: 100.0000 mg | ORAL_TABLET | Freq: Every day | ORAL | 3 refills | Status: DC

## 2019-10-24 NOTE — Telephone Encounter (Signed)
  Plan/Recommendations: - Continue azathioprine.  Increased dose 100 mg/day - Check metabolic panel, CBC, QuantiFERON gold and hepatitis panel - Start supplemental oxygen with exertion and at night.   Rx for 100mg  Imuran has been sent to preferred pharmacy for pt. Called and spoke with pt letting her know this had been done and she verbalized understanding. Nothing further needed.

## 2019-10-24 NOTE — Research (Signed)
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   Protocol #: IPF-PRO-SUB, Clinical Trials # [id number], Sponsor: [employer]  Protocol Version Amendment 4 dated 12Sep2019  and confirmed current on 10/24/2019 Consent Version for today's visit date of 10/24/2019  is Version 5 dated 05Dec2019  Objectives:  [name] Describe current approaches to diagnosis and treatment of chronic fibrosing ILDs with progressive phenotype  . Describe the natural history of chronic fibrosing ILDs with progressive phenotype  . Assess quality of life from self-administered participant reported questionnaires for each disease group  . Describe participant interactions with the healthcare system, describe treatment practices across multiple institutions for each disease group  . Collect biological samples linked to well characterized chronic fibrosing ILDs with progressive phenotype to identify disease biomarkers  . Collect data and biological samples that will support future research studies.                                            Key Inclusion Criteria: Willing and able to provide informed consent  Age ? 30 years  Diagnosis of a non-IPF ILD of any duration, including, but not limited to Idiopathic Non-Specific Interstitial Pneumonia (INSIP), Unclassifiable Idiopathic Interstitial Pneumonias (IIPs), Interstitial Pneumonia with Autoimmune Features (IPAF), Autoimmune ILDs such as Rheumatoid Arthritis (RA-ILD) and Systemic Sclerosis (SSC-ILD), Chronic Hypersensitivity Pneumonitis (HP), Sarcoidosis or Exposure-related ILDs such as asbestosis.  Chronic fibrosing ILD defined by reticular abnormality with traction bronchiectasis with or without honeycombing confirmed by chest HRCT scan and/or lung biopsy.  Progressive phenotype as defined by fulfilling at least one of the criteria below of fibrotic changes (progression set point) within  the last 24 months regardless of treatment considered appropriate in individual ILDs:  . decline in FVC % predicted (% pred) based on >10% relative decline  . decline in FVC % pred based on ? 5 - <10% relative decline in FVC combined with worsening of respiratory symptoms as assessed by the site investigator  . decline in FVC % pred based on ? 5 - <10% relative decline in FVC combined with increasing extent of fibrotic changes on chest imaging (HRCT scan) as assessed by the site investigator  . decline in DLCO % pred based on ? 10% relative decline  . worsening of respiratory symptoms as well as increasing extent of fibrotic changes on chest imaging (HRCT scan) as assessed by the site investigator independent of FVC change.    Key Exclusion Criteria: Malignancy, treated or untreated, other than skin or early stage prostate cancer, within the past 5 years  Currently listed for lung transplantation at the time of enrollment  Currently enrolled in a clinical trial at the time of enrollment in this registry    Clinical Research Coordinator / Research RN note : This visit for Subject Ruth Brown with DOB: [birth year] on 10/24/2019 for the above protocol is Visit/Encounter # Enrollment/Baseline and is for purpose of research. The consent for this encounter is under Protocol Version Amendment 4 (12Sep2019)  and is currently IRB approved. Subject expressed continued interest and consent in continuing as a study subject. Subject confirmed that there was no change in contact information (e.g. address, telephone, email). Subject thanked for participation in research and contribution to science.   Duringthis visiton07/16/2021,the subject met withme in the office to discuss the protocol ICF and sign.The study coordinatorwent over the entire ICF  with the subject and the subject was given ample time to read and review the ICF. After review of the ICF,all questions were answered to the subject's  satisfaction.Sub Investigator, Tammy Parrett, NPreviewed the ICF with the patient, explained the purpose of research, and asked if there were any additional questions.Shestated that the study coordinatorexplained the study to herthoroughly and stated she had no additional questions. The subject,study coordinator,and Sub Investigator signed the ICF and the subject was given a signed copy of the ICF for herrecords. After consent all study related procedures were conducted as per theabovestated protocol. For additional information on today's visit,please refer to subject's paper source binder.   Signed by  Gwendolyn Grant Research Assistant [facility]  [place], [place] 11:33 AM 10/24/2019

## 2019-10-27 ENCOUNTER — Telehealth: Payer: Self-pay | Admitting: Pulmonary Disease

## 2019-10-28 NOTE — Telephone Encounter (Signed)
Order has been sent to [facility] °

## 2019-10-30 ENCOUNTER — Ambulatory Visit: Payer: 59 | Admitting: Pulmonary Disease

## 2019-11-19 ENCOUNTER — Ambulatory Visit: Payer: 59

## 2019-11-19 ENCOUNTER — Other Ambulatory Visit: Payer: Self-pay

## 2019-11-19 DIAGNOSIS — G4733 Obstructive sleep apnea (adult) (pediatric): Secondary | ICD-10-CM | POA: Diagnosis not present

## 2019-11-19 DIAGNOSIS — Z9189 Other specified personal risk factors, not elsewhere classified: Secondary | ICD-10-CM

## 2019-11-25 DIAGNOSIS — G4733 Obstructive sleep apnea (adult) (pediatric): Secondary | ICD-10-CM | POA: Diagnosis not present

## 2019-11-28 ENCOUNTER — Telehealth: Payer: Self-pay | Admitting: Pulmonary Disease

## 2019-11-28 NOTE — Telephone Encounter (Signed)
Called and spoke with Patient.  Patient stated she was just checking to see if STD paperwork had been faxed.  She was told by her insurance company they were faxing it 11/26/19.   At this time I have not received any STD paperwork on Patient. Patient stated she would check back next week.

## 2019-12-05 ENCOUNTER — Telehealth: Payer: Self-pay | Admitting: Pulmonary Disease

## 2019-12-05 NOTE — Telephone Encounter (Signed)
Spoke with patient. She was calling to check on the status of her disability paperwork. [employer] sent over the paperwork on August 18th. I checked Dr. Shirlee More look-at folder, I did not see any forms.   Patrice, have you seen any forms for her?

## 2019-12-09 NOTE — Telephone Encounter (Signed)
Called and left message on pt vm to advise I do not have any disability forms - advised to call me back to have forms refaxed or advise if only records are needing to be sent -pr

## 2019-12-10 ENCOUNTER — Other Ambulatory Visit: Payer: Self-pay

## 2019-12-10 ENCOUNTER — Ambulatory Visit (INDEPENDENT_AMBULATORY_CARE_PROVIDER_SITE_OTHER): Payer: 59 | Admitting: Pulmonary Disease

## 2019-12-10 ENCOUNTER — Encounter: Payer: Self-pay | Admitting: Pulmonary Disease

## 2019-12-10 VITALS — BP 122/70 | HR 90 | Temp 97.4°F | Ht 60.0 in | Wt 121.0 lb

## 2019-12-10 DIAGNOSIS — Z5181 Encounter for therapeutic drug level monitoring: Secondary | ICD-10-CM

## 2019-12-10 DIAGNOSIS — J849 Interstitial pulmonary disease, unspecified: Secondary | ICD-10-CM

## 2019-12-10 LAB — CBC WITH DIFFERENTIAL/PLATELET
Basophils Absolute: 0.1 10*3/uL (ref 0.0–0.1)
Basophils Relative: 0.5 % (ref 0.0–3.0)
Eosinophils Absolute: 0.1 10*3/uL (ref 0.0–0.7)
Eosinophils Relative: 0.5 % (ref 0.0–5.0)
HCT: 31 % — ABNORMAL LOW (ref 36.0–46.0)
Hemoglobin: 10.1 g/dL — ABNORMAL LOW (ref 12.0–15.0)
Lymphocytes Relative: 5.5 % — ABNORMAL LOW (ref 12.0–46.0)
Lymphs Abs: 0.8 10*3/uL (ref 0.7–4.0)
MCHC: 32.4 g/dL (ref 30.0–36.0)
MCV: 88.3 fl (ref 78.0–100.0)
Monocytes Absolute: 0.6 10*3/uL (ref 0.1–1.0)
Monocytes Relative: 4 % (ref 3.0–12.0)
Neutro Abs: 13.9 10*3/uL — ABNORMAL HIGH (ref 1.4–7.7)
Neutrophils Relative %: 89.5 % — ABNORMAL HIGH (ref 43.0–77.0)
Platelets: 667 10*3/uL — ABNORMAL HIGH (ref 150.0–400.0)
RBC: 3.51 Mil/uL — ABNORMAL LOW (ref 3.87–5.11)
RDW: 17.1 % — ABNORMAL HIGH (ref 11.5–15.5)
WBC: 15.5 10*3/uL — ABNORMAL HIGH (ref 4.0–10.5)

## 2019-12-10 LAB — COMPREHENSIVE METABOLIC PANEL
ALT: 5 U/L (ref 0–35)
AST: 14 U/L (ref 0–37)
Albumin: 3.3 g/dL — ABNORMAL LOW (ref 3.5–5.2)
Alkaline Phosphatase: 68 U/L (ref 39–117)
BUN: 5 mg/dL — ABNORMAL LOW (ref 6–23)
CO2: 33 mEq/L — ABNORMAL HIGH (ref 19–32)
Calcium: 9.4 mg/dL (ref 8.4–10.5)
Chloride: 97 mEq/L (ref 96–112)
Creatinine, Ser: 0.48 mg/dL (ref 0.40–1.20)
GFR: 158.26 mL/min (ref >60.00)
Glucose, Bld: 104 mg/dL — ABNORMAL HIGH (ref 70–99)
Potassium: 3.5 mEq/L (ref 3.5–5.1)
Sodium: 135 mEq/L (ref 135–145)
Total Bilirubin: 0.3 mg/dL (ref 0.2–1.2)
Total Protein: 8.2 g/dL (ref 6.0–8.3)

## 2019-12-10 NOTE — Addendum Note (Signed)
Addended by: Dorisann Frames R on: 12/10/2019 09:52 AM   Modules accepted: Orders

## 2019-12-10 NOTE — Patient Instructions (Addendum)
We will check a CBC, CMP today Based on the results we may increase azathioprine dose.  I will call you and let you know Schedule in 3 months and follow-up in clinic after that.

## 2019-12-10 NOTE — Progress Notes (Addendum)
Ruth Brown    [id number]    [birth year]  Primary Care Physician:Richter, Marcos Eke, MD  Referring Physician: Dois Davenport, MD [address] [place],  [place] [address]  Chief complaint:   ILD Chronic Cough - Improved with treatment of GERD  HPI:  Ruth Brown is a 64 year old female with Rheumatoid Arthritis (+RF, neg CCP, ANA 1:1280) referred by Dr.Byrum after CT on 3/17 showed ILD. Patient first presented to Dr.Byrum 6 months ago for dyspnea and cough. MBS on 10/15/2018 showed patient consistent with GERD, cough improved with treatment of GERD. Patient followed by Elpidio Anis for possible RA.  Patient's RA being treated with Remicade and Prednisone, and reports improvement in joint pain. Her dyspnea is progressively worsened over the last 6 month.   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  Exposures:  Smoking history: Never smoker Travel history: Relevant family history: Father COPD  Interim history: Continues on azathioprine.  He is tolerating this dose Complains of chronic dyspnea on exertion.  Continues on supplemental oxygen Continues Remicade with Dr. Nickola Major.  She has had 10 to 15 pound weight loss recently with indigestion and is planning to follow-up with GI   Outpatient Encounter Medications as of 12/10/2019  Medication Sig  . acetaminophen (TYLENOL) 500 MG tablet Take 1,000 mg by mouth every 6 (six) hours as needed for mild pain.   [name] albuterol (PROVENTIL) (2.5 MG/3ML) 0.083% nebulizer solution Take 3 mLs (2.5 mg total) by nebulization every 6 (six) hours as needed for wheezing or shortness of breath. (Patient taking differently: Take 2.5 mg by nebulization in the morning and at bedtime. )  . azaTHIOprine (IMURAN) 50 MG tablet Take 2 tablets (100 mg total) by mouth daily.  . budesonide (PULMICORT) 0.5 MG/2ML  nebulizer solution Take 2 mLs by nebulization 2 (two) times daily.  . busPIRone (BUSPAR) 10 MG tablet Take 10 mg by mouth daily.  . citalopram (CELEXA) 20 MG tablet Take 1 tablet (20 mg total) by mouth daily.  . folic acid (FOLVITE) 1 MG tablet Take 1 mg by mouth daily.  . hydrochlorothiazide (HYDRODIURIL) 12.5 MG tablet Take 12.5 mg by mouth daily.   [name] inFLIXimab (REMICADE) 100 MG injection Inject 100 mg into the vein every 30 (thirty) days.   . Misc. Devices MISC Mask and tubing for SierraNeb 2 - Aero flow  . omeprazole (PRILOSEC) 20 MG capsule Take 20 mg by mouth daily as needed (Heartburn).   . simvastatin (ZOCOR) 20 MG tablet Take 20 mg by mouth daily. Takes 2 tabs (40mg ) a day  . vitamin B-12 (CYANOCOBALAMIN) 100 MCG tablet Take 100 mcg by mouth daily.  . Cholecalciferol (VITAMIN D3) 1.25 MG (50000 UT) TABS Take 50,000 Units by mouth once a week. Monday (Patient not taking: Reported on 10/22/2019)   No facility-administered encounter medications on file as of 12/10/2019.   Physical Exam: Blood pressure 122/70, pulse 90, temperature (!) 97.4 F (36.3 C), height 5' (1.524 m), weight 121 lb (54.9 kg), SpO2 99 %. Gen:      No acute distress HEENT:  EOMI, sclera anicteric Neck:     No masses; no thyromegaly Lungs:    Clear to auscultation bilaterally; normal respiratory effort CV:         Regular rate and rhythm; no murmurs Abd:      +  bowel sounds; soft, non-tender; no palpable masses, no distension Ext:    No edema; adequate peripheral perfusion Skin:      Warm and dry; no rash Neuro: alert and oriented x 3 Psych: normal mood and affect  Data Reviewed: Imaging: CT Chest High Resolution 06/25/2019-considerable respiratory motion.  Worsening peripheral septal thickening with reticulation, bronchiectasis.  I have reviewed the images personally.  PFTs: 08/11/2019 FVC 1.05(48%) FEV1 .91(53%) F/F 86 (109) TLC 2.06 (46%) DLCO 9.44 (54%) Severe restriction, moderate-severe diffusion  defect  Labs: 10/06/2018 ANA 1:1280, notable reflex labs: RF 15 ( nl <14), CCP neg, Jo 1 -, Sjogren's antibodies negative  CK 08/11/2019-154 Aldolase 08/11/2019-8.8 Myositis panel 08/11/2019-positive PL 12 antibody TPMT 08/11/2019-17  QuantiFERON gold 714/21-negative  Cardiac: EF 60-65%, grade 1 diastolic dysfunction, no regional wall motion abnormalities, right atrium moderately dilated, RVP pressure mildly elevated 40.9 mmHg  Right heart catheterization 09/23/2019  Normal right heart pressures.  Ao 98%, PA 71%, PA 30/14, mean 21 mm Hg; mean PCWP 9 mm Hg; CO 5.3 L/min; CI 3.3  Assessment: Interstitial lung disease CT consistent with CTD ILD.  Patient currently on prednisone and Remicade for possible rheumatoid arthritis.  RF factor low end of normal and CCP negative. ANA 1:1280 convincing of rheumatologic disease.  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 No ' Print production planner' on exam.  Noted to have elevated CK, aldolase and PL 12 antibody.  She has been started Azathioprine as it helps with joint and lung involvement.  TPMT levels are okay We will check labs and if normal can increase azathioprine to 150 mg PFTs and follow-up in 3 months Close follow up and consider antifibrotic if there is progression.  Concern for pulmonary HTN Refered to cardiology for right heart cath to better evaluate elevated right heart pressures and possible PHTN.  Thankfully the cardiac catheterization did not show any pulmonary hypertension  Plan/Recommendations: - Continue azathioprine.  Increased dose 150 mg/day - Start supplemental oxygen with exertion and at night.   Chilton Greathouse MD [facility] Please see [email or web address] for pager details.  12/10/2019, 9:28 AM  CC: Dois Davenport, MD

## 2019-12-10 NOTE — Addendum Note (Signed)
Addended by: Demetrio Lapping E on: 12/10/2019 09:52 AM   Modules accepted: Orders

## 2019-12-17 NOTE — Progress Notes (Signed)
Labs show elevation in WBC count.Please inquire if she has any fevers or new cough with sputum or any signs of infection such as redness of the skin, swelling or burning sensation when she urinatesOrder x-ray.

## 2019-12-17 NOTE — Telephone Encounter (Signed)
Rec'd FMLA paperwork via fax from [place] - pr

## 2019-12-18 ENCOUNTER — Telehealth: Payer: Self-pay | Admitting: Pulmonary Disease

## 2019-12-18 DIAGNOSIS — J849 Interstitial pulmonary disease, unspecified: Secondary | ICD-10-CM

## 2019-12-18 NOTE — Telephone Encounter (Signed)
Called and spoke with patient about her lab results. She states that since last Sunday 9/5 she has been having fevers and dry cough. Highest her temp has been so far is 100.5. She states that she is supposed to go Sunday 9/12 to get Covid tested. I asked her why she has to wait to long she states they are booked up and that's when she could get in. Informed her that Dr. Isaiah Serge would like for her to have a CXR but with her symptoms and her waiting to get covid tested she could not come into the office. She expressed understanding. I asked patient to call and let us know the results of her covid test when she got them and If it was negative then she could come in and get CXR. She will call back with covid test results and we will go from there.   Dr. Joetta Manners

## 2019-12-18 NOTE — Telephone Encounter (Signed)
Completed FMLA paperwork - holding until 12/22/2019 to give to Dr. Isaiah Serge for signature-pr

## 2019-12-24 NOTE — Telephone Encounter (Signed)
Rec'd signed paperwork back from Dr. Isaiah Serge, pt has paid and signed necessary documents - emailed documents to [place] as fax is not working - called and spoke to patient and made her aware -pr

## 2019-12-31 ENCOUNTER — Encounter: Payer: Self-pay | Admitting: Pulmonary Disease

## 2019-12-31 ENCOUNTER — Other Ambulatory Visit: Payer: Self-pay

## 2019-12-31 ENCOUNTER — Ambulatory Visit (INDEPENDENT_AMBULATORY_CARE_PROVIDER_SITE_OTHER): Payer: 59

## 2019-12-31 ENCOUNTER — Ambulatory Visit (INDEPENDENT_AMBULATORY_CARE_PROVIDER_SITE_OTHER): Payer: 59 | Admitting: Pulmonary Disease

## 2019-12-31 VITALS — HR 109 | Temp 98.4°F | Ht 60.0 in | Wt 116.0 lb

## 2019-12-31 DIAGNOSIS — J849 Interstitial pulmonary disease, unspecified: Secondary | ICD-10-CM

## 2019-12-31 DIAGNOSIS — Z5181 Encounter for therapeutic drug level monitoring: Secondary | ICD-10-CM | POA: Diagnosis not present

## 2019-12-31 DIAGNOSIS — M351 Other overlap syndromes: Secondary | ICD-10-CM | POA: Insufficient documentation

## 2019-12-31 MED ORDER — BUDESONIDE 0.5 MG/2ML IN SUSP: 2.0000 mL | Freq: Two times a day (BID) | RESPIRATORY_TRACT | 3 refills | Status: DC

## 2019-12-31 NOTE — Patient Instructions (Addendum)
You were seen today by Coral Ceo, NP  for:   1. ILD (interstitial lung disease) (HCC)  - DG Chest 2 View; Future  Continue your current medications  We will refill your budesonide nebulized medications today  We will continue to plan on a pulmonary function test-spirometry with DLCO in December/2021  We recommend today:  Orders Placed This Encounter  Procedures  . DG Chest 2 View    Standing Status:   Future    Standing Expiration Date:   05/01/2020    Order Specific Question:   Reason for Exam (SYMPTOM  OR DIAGNOSIS REQUIRED)    Answer:   ild    Order Specific Question:   Preferred imaging location?    Answer:   Internal    Order Specific Question:   Radiology Contrast Protocol - do NOT remove file path    Answer:   [email or web address]   Orders Placed This Encounter  Procedures  . DG Chest 2 View   No orders of the defined types were placed in this encounter.   Follow Up:    Return in about 12 weeks (around 03/24/2020), or if symptoms worsen or fail to improve, for Follow up with Dr. Isaiah Serge, Follow up for PFT - , Spiro with DLCO.   Notification of test results are managed in the following manner: If there are  any recommendations or changes to the  plan of care discussed in office today,  we will contact you and let you know what they are. If you do not hear from [place], then your results are normal and you can view them through your  MyChart account , or a letter will be sent to you. Thank you again for trusting [place] with your care  - Thank you, [facility]    It is flu season:   >>> Best ways to protect herself from the flu: Receive the yearly flu vaccine, practice good hand hygiene washing with soap and also using hand sanitizer when available, eat a nutritious meals, get adequate rest, hydrate appropriately       Please contact the office if your symptoms worsen or you have concerns that you are not  improving.   Thank you for choosing [facility] for your healthcare, and for allowing [place] to partner with you on your healthcare journey. I am thankful to be able to provide care to you today.   Elisha Headland FNP-C

## 2019-12-31 NOTE — Assessment & Plan Note (Signed)
Plan: Continue Remicade Continue Imuran Continue follow-up with rheumatology After chest x-ray today May need to consider increasing Imuran to planned 150 mg daily, will discuss case with Dr. Isaiah Serge

## 2019-12-31 NOTE — Assessment & Plan Note (Signed)
Plan: Chest x-ray today Continue Imuran Continue Remicade Spirometry with DLCO in December/2021 Continue oxygen therapy as prescribed

## 2019-12-31 NOTE — Progress Notes (Signed)
@Patient  ID: , female    DOB: 03/02/1956, 64 y.o.   MRN: [id number]  Chief Complaint  Patient presents with   Follow-up    reports symptoms of fever, loss of taste/smell, and fevers have improved since onset late AUG 21. c/o still losing weight    Referring provider: 22, MD  HPI:   64 year old female never smoker followed in our office for connective tissue ILD  PMH: Hypertension, anxiety, hyperlipidemia, depression Smoker/ Smoking History: Never smoker Maintenance:   Pt of: Dr. 68  12/31/2019  - Visit   64 year old female never smoker followed in our office for connective tissue ILD.  She is followed by Dr. 68.  She was last seen by Dr. Isaiah Serge on 12/10/2019.  Plan of care from that office visit was as follows: Continue Imuran, lab work today May need to increase to 150 mg daily.  Start supplemental oxygen with exertion and at night.  Pulmonary function test should be obtained in 3 months.  If there is progression need to consider antifibrotic's.  Patient's lab work at last office visit showed elevated white blood cell count.  Patient was acutely ill.  Felt to have a viral illness.  Patient was contacted to schedule a chest x-ray and she reported that she was in the process of obtaining a Covid test.  Patient's Covid test results are listed below:  12/23/2019-SARS-CoV-2-negative  Patient arrived today on room air.  She did have her oxygen with her just in case she needed it.  Oxygen levels were 93 percent on room air.  She is requesting help with understanding how to use her oxygen tank today.  We will evaluate this.  Patient is hopeful to obtain a chest x-ray today.  She reports that she is feeling significantly better than how she was last week.  She reports that she was acutely ill since the end of August/2021.     Questionaires / Pulmonary Flowsheets:   ACT:  No flowsheet data found.  MMRC: mMRC Dyspnea Scale mMRC Score  10/22/2019 3   06/26/2019 3    Epworth:  No flowsheet data found.  Tests:   Imaging: CT Chest High Resolution 06/25/2019-considerable respiratory motion.  Worsening peripheral septal thickening with reticulation, bronchiectasis.    PFTs: 08/11/2019 FVC 1.05(48%) FEV1 .91(53%) F/F 86 (109) TLC 2.06 (46%) DLCO 9.44 (54%) Severe restriction, moderate-severe diffusion defect  Labs: 10/06/2018 ANA 1:1280, notable reflex labs: RF 15 ( nl <14), CCP neg, Jo 1 -, Sjogren's antibodies negative  CK 08/11/2019-154 Aldolase 08/11/2019-8.8 Myositis panel 08/11/2019-positive PL 12 antibody TPMT 08/11/2019-17  QuantiFERON gold 714/21-negative  Cardiac: EF 60-65%, grade 1 diastolic dysfunction, no regional wall motion abnormalities, right atrium moderately dilated, RVP pressure mildly elevated 40.9 mmHg  Right heart catheterization 09/23/2019  Normal right heart pressures.  Ao 98%, PA 71%, PA 30/14, mean 21 mm Hg; mean PCWP 9 mm Hg; CO 5.3 L/min; CI 3.3   FENO:  No results found for: NITRICOXIDE  PFT: PFT Results Latest Ref Rng & Units 08/11/2019  FVC-Pre L 1.05  FVC-Predicted Pre % 48  FVC-Post L 1.35  FVC-Predicted Post % 62  Pre FEV1/FVC % % 86  Post FEV1/FCV % % 81  FEV1-Pre L 0.91  FEV1-Predicted Pre % 53  FEV1-Post L 1.09  DLCO uncorrected ml/min/mmHg 9.44  DLCO UNC% % 54  DLCO corrected ml/min/mmHg 9.44  DLCO COR %Predicted % 54  DLVA Predicted % 96  TLC L 2.06  TLC % Predicted % 46  RV % Predicted % 44    WALK:  SIX MIN WALK 10/22/2019  Supplimental Oxygen during Test? (L/min) No  Tech Comments: pt ambulated at a slow pace, stopping to rest, sats dropped to 85% RA, after stopping sats recovered to 90%RA    Imaging: No results found.  Lab Results:  CBC    Component Value Date/Time   WBC 15.5 (H) 12/10/2019 0952   RBC 3.51 (L) 12/10/2019 0952   HGB 10.1 (L) 12/10/2019 0952   HCT 31.0 (L) 12/10/2019 0952   PLT 667.0 (H) 12/10/2019 0952   MCV 88.3 12/10/2019 0952   MCH  28.2 06/29/2018 1619   MCHC 32.4 12/10/2019 0952   RDW 17.1 (H) 12/10/2019 0952   LYMPHSABS 0.8 12/10/2019 0952   MONOABS 0.6 12/10/2019 0952   EOSABS 0.1 12/10/2019 0952   BASOSABS 0.1 12/10/2019 0952    BMET    Component Value Date/Time   NA 135 12/10/2019 0952   NA 142 05/05/2019 1116   K 3.5 12/10/2019 0952   CL 97 12/10/2019 0952   CO2 33 (H) 12/10/2019 0952   GLUCOSE 104 (H) 12/10/2019 0952   BUN 5 (L) 12/10/2019 0952   BUN 16 05/05/2019 1116   CREATININE 0.48 12/10/2019 0952   CALCIUM 9.4 12/10/2019 0952   GFRNONAA 101 05/05/2019 1116   GFRAA 116 05/05/2019 1116    BNP    Component Value Date/Time   BNP 8.1 06/29/2018 1619    ProBNP No results found for: PROBNP  Specialty Problems      Pulmonary Problems   Cough   Dyspnea   ILD (interstitial lung disease) (HCC)      No Known Allergies  Immunization History  Administered Date(s) Administered   Influenza,inj,Quad PF,6+ Mos 02/21/2016   Influenza-Unspecified 02/12/2017   PFIZER SARS-COV-2 Vaccination 07/17/2019, 08/11/2019   Tdap 02/21/2016    Past Medical History:  Diagnosis Date   Anxiety    Arthritis    Depression    Gallstone    HTN (hypertension)    Hyperlipidemia     Tobacco History: Social History   Tobacco Use  Smoking Status Never Smoker  Smokeless Tobacco Never Used   Counseling given: Yes   Continue to not smoke  Outpatient Encounter Medications as of 12/31/2019  Medication Sig   acetaminophen (TYLENOL) 500 MG tablet Take 1,000 mg by mouth every 6 (six) hours as needed for mild pain.    albuterol (PROVENTIL) (2.5 MG/3ML) 0.083% nebulizer solution Take 3 mLs (2.5 mg total) by nebulization every 6 (six) hours as needed for wheezing or shortness of breath. (Patient taking differently: Take 2.5 mg by nebulization in the morning and at bedtime. )   azaTHIOprine (IMURAN) 50 MG tablet Take 2 tablets (100 mg total) by mouth daily.   budesonide (PULMICORT) 0.5 MG/2ML  nebulizer solution Take 2 mLs by nebulization 2 (two) times daily.   busPIRone (BUSPAR) 10 MG tablet Take 10 mg by mouth daily.   Cholecalciferol (VITAMIN D3) 1.25 MG (50000 UT) TABS Take 50,000 Units by mouth once a week. Monday (Patient not taking: Reported on 10/22/2019)   citalopram (CELEXA) 20 MG tablet Take 1 tablet (20 mg total) by mouth daily.   folic acid (FOLVITE) 1 MG tablet Take 1 mg by mouth daily.   hydrochlorothiazide (HYDRODIURIL) 12.5 MG tablet Take 12.5 mg by mouth daily.    inFLIXimab (REMICADE) 100 MG injection Inject 100 mg into the vein every 30 (thirty) days.    Misc. Devices MISC Mask and tubing  for SierraNeb 2 - Aero flow   omeprazole (PRILOSEC) 20 MG capsule Take 20 mg by mouth daily as needed (Heartburn).    simvastatin (ZOCOR) 20 MG tablet Take 20 mg by mouth daily. Takes 2 tabs (40mg ) a day   vitamin B-12 (CYANOCOBALAMIN) 100 MCG tablet Take 100 mcg by mouth daily.   No facility-administered encounter medications on file as of 12/31/2019.     Review of Systems  Review of Systems  Constitutional: Positive for fatigue. Negative for activity change and fever.  HENT: Negative for sinus pressure, sinus pain and sore throat.   Respiratory: Positive for shortness of breath. Negative for cough and wheezing.   Cardiovascular: Negative for chest pain and palpitations.  Gastrointestinal: Negative for diarrhea, nausea and vomiting.  Musculoskeletal: Negative for arthralgias.  Neurological: Negative for dizziness.  Psychiatric/Behavioral: Negative for sleep disturbance. The patient is not nervous/anxious.      Physical Exam  Pulse (!) 109    Temp 98.4 F (36.9 C)    Ht 5' (1.524 m)    Wt 116 lb (52.6 kg)    LMP  (LMP Unknown)    SpO2 93%    BMI 22.65 kg/m   Wt Readings from Last 5 Encounters:  12/31/19 116 lb (52.6 kg)  12/10/19 121 lb (54.9 kg)  10/22/19 130 lb (59 kg)  09/23/19 138 lb (62.6 kg)  09/17/19 138 lb (62.6 kg)    BMI Readings from Last 5  Encounters:  12/31/19 22.65 kg/m  12/10/19 23.63 kg/m  10/22/19 25.39 kg/m  09/23/19 26.95 kg/m  09/17/19 26.95 kg/m     Physical Exam Vitals and nursing note reviewed.  Constitutional:      General: She is not in acute distress.    Appearance: Normal appearance.  HENT:     Head: Normocephalic and atraumatic.     Right Ear: Tympanic membrane, ear canal and external ear normal. There is no impacted cerumen.     Left Ear: Tympanic membrane, ear canal and external ear normal. There is no impacted cerumen.     Nose: Nose normal. No congestion or rhinorrhea.     Mouth/Throat:     Mouth: Mucous membranes are moist.     Pharynx: Oropharynx is clear.  Eyes:     Pupils: Pupils are equal, round, and reactive to light.  Cardiovascular:     Rate and Rhythm: Normal rate and regular rhythm.     Pulses: Normal pulses.     Heart sounds: Normal heart sounds. No murmur heard.   Pulmonary:     Effort: Pulmonary effort is normal. No respiratory distress.     Breath sounds: Normal breath sounds. No decreased air movement. No decreased breath sounds, wheezing or rales.  Musculoskeletal:     Cervical back: Normal range of motion.  Skin:    General: Skin is warm and dry.     Capillary Refill: Capillary refill takes less than 2 seconds.  Neurological:     General: No focal deficit present.     Mental Status: She is alert and oriented to person, place, and time. Mental status is at baseline.     Gait: Gait normal.  Psychiatric:        Mood and Affect: Mood normal.        Behavior: Behavior normal.        Thought Content: Thought content normal.        Judgment: Judgment normal.       Assessment & Plan:   ILD (interstitial  lung disease) (HCC) Plan: Chest x-ray today Continue Imuran Continue Remicade Spirometry with DLCO in December/2021 Continue oxygen therapy as prescribed   Connective tissue disease overlap syndrome (HCC) Plan: Continue Remicade Continue Imuran Continue  follow-up with rheumatology After chest x-ray today May need to consider increasing Imuran to planned 150 mg daily, will discuss case with Dr. Isaiah SergeMannam    Return in about 12 weeks (around 03/24/2020), or if symptoms worsen or fail to improve, for Follow up with Dr. Isaiah SergeMannam, Follow up for PFT - 30min, Spiro with DLCO.   Coral CeoBrian P Kittie Krizan, NP 12/31/2019   This appointment required 32 minutes of patient care (this includes precharting, chart review, review of results, face-to-face care, etc.).

## 2020-01-06 ENCOUNTER — Telehealth: Payer: Self-pay | Admitting: Pulmonary Disease

## 2020-01-06 NOTE — Telephone Encounter (Signed)
01/06/2020  Please contact the patient and let her know that I have discussed her case with Dr. Isaiah Serge.  He would like for her to increase her Imuran dosing as previously recommended to 150 mg daily.  Please change her prescription to reflect this new recommendation.  Imuran 50 mg tablet Please take 3 tablets total (150 mg total) daily  Patient needs to keep follow-up with our office.  Ruth Headland, FNP

## 2020-01-07 NOTE — Telephone Encounter (Signed)
I tried to call patient to go over change in meds. There was no answer, left VM for callback.

## 2020-01-08 ENCOUNTER — Other Ambulatory Visit: Payer: Self-pay | Admitting: Pulmonary Disease

## 2020-01-08 DIAGNOSIS — J849 Interstitial pulmonary disease, unspecified: Secondary | ICD-10-CM

## 2020-01-08 MED ORDER — AZATHIOPRINE 50 MG PO TABS: 150.0000 mg | ORAL_TABLET | Freq: Every day | ORAL | 3 refills | Status: DC

## 2020-01-08 NOTE — Telephone Encounter (Signed)
I called and spoke with patient regarding Imuran and increasing the dose. She verbalized understanding and requested a refill due to the increase. Sent in  Refill to the preferred pharmacy and nothing else was needed.

## 2020-02-04 ENCOUNTER — Ambulatory Visit (INDEPENDENT_AMBULATORY_CARE_PROVIDER_SITE_OTHER): Payer: 59 | Admitting: Pulmonary Disease

## 2020-02-04 ENCOUNTER — Other Ambulatory Visit: Payer: Self-pay

## 2020-02-04 ENCOUNTER — Encounter: Payer: Self-pay | Admitting: Pulmonary Disease

## 2020-02-04 VITALS — BP 112/80 | HR 88 | Temp 97.3°F | Ht 60.0 in | Wt 118.0 lb

## 2020-02-04 DIAGNOSIS — J849 Interstitial pulmonary disease, unspecified: Secondary | ICD-10-CM | POA: Diagnosis not present

## 2020-02-04 DIAGNOSIS — R059 Cough, unspecified: Secondary | ICD-10-CM | POA: Diagnosis not present

## 2020-02-04 DIAGNOSIS — M351 Other overlap syndromes: Secondary | ICD-10-CM

## 2020-02-04 DIAGNOSIS — Z0000 Encounter for general adult medical examination without abnormal findings: Secondary | ICD-10-CM | POA: Insufficient documentation

## 2020-02-04 DIAGNOSIS — J310 Chronic rhinitis: Secondary | ICD-10-CM | POA: Diagnosis not present

## 2020-02-04 MED ORDER — FLUTICASONE PROPIONATE 50 MCG/ACT NA SUSP: 1.0000 | Freq: Every day | NASAL | 3 refills | Status: DC

## 2020-02-04 MED ORDER — CHLORPHENIRAMINE MALEATE 4 MG PO TABS: 4.0000 mg | ORAL_TABLET | Freq: Every evening | ORAL | 3 refills | Status: DC | PRN

## 2020-02-04 NOTE — Assessment & Plan Note (Signed)
Plan: Continue daily loratadine Resume Flonase use Can use chlor tabs at night

## 2020-02-04 NOTE — Progress Notes (Signed)
@Patient  ID: Ruth Brown, female    DOB: 09/16/1955, 64 y.o.   MRN: [id number]  Chief Complaint  Patient presents with  . Follow-up    SOB/ cough improved     Referring provider: Dois Davenportichter, Karen L, MD  HPI:  64 year old female never smoker followed in our office for connective tissue ILD  PMH: Hypertension, anxiety, hyperlipidemia, depression Smoker/ Smoking History: Never smoker Maintenance: Remicade, Imuran, Pulmicort Pt of: Dr. Isaiah SergeMannam  02/04/2020  - Visit   64 year old female never smoker found our office for connective tissue ILD.  She is followed by Dr. Isaiah SergeMannam.  She was last seen in our office in September/2021.  At that time patient was following up after a viral illness.  Her Covid test was negative in September/2021.  She was feeling better at last evaluation.  Plan of care from last office visit was for her to continue follow-up with rheumatology as well as continue Imuran and Remicade.  Follow-up lab work and chest x-ray at last visit was stable the patient's Imuran was increased.  It was planned for the patient have spirometry with DLCO in December/2021.   Patient reports that her cough and shortness of breath continue to improve.  She reports some occasional myalgias.  Nothing out of the ordinary.  She is following up with rheumatology next week.  She has tolerated the increase of her Imuran.  She continues to be on Remicade.  She continues to use Pulmicort nebs.  She is wondering if she can have a work note so that she is able to utilize Pulmicort nebs during the day at work if need be.  Questionaires / Pulmonary Flowsheets:   MMRC: mMRC Dyspnea Scale mMRC Score  02/04/2020 2  10/22/2019 3  06/26/2019 3    Tests:   Imaging: CT Chest High Resolution 06/25/2019-considerable respiratory motion.  Worsening peripheral septal thickening with reticulation, bronchiectasis.    PFTs: 08/11/2019 FVC 1.05(48%) FEV1 .91(53%) F/F 86 (109) TLC 2.06 (46%) DLCO 9.44  (54%) Severe restriction, moderate-severe diffusion defect  Labs: 10/06/2018 ANA 1:1280, notable reflex labs: RF 15 ( nl <14), CCP neg, Jo 1 -, Sjogren's antibodies negative  CK 08/11/2019-154 Aldolase 08/11/2019-8.8 Myositis panel 08/11/2019-positive PL 12 antibody TPMT 08/11/2019-17  QuantiFERON gold 714/21-negative  Cardiac: EF 60-65%, grade 1 diastolic dysfunction, no regional wall motion abnormalities, right atrium moderately dilated, RVP pressure mildly elevated 40.9 mmHg  Right heart catheterization 09/23/2019  Normal right heart pressures.  Ao 98%, PA 71%, PA 30/14, mean 21 mm Hg; mean PCWP 9 mm Hg; CO 5.3 L/min; CI 3.3    FENO:  No results found for: NITRICOXIDE  PFT: PFT Results Latest Ref Rng & Units 08/11/2019  FVC-Pre L 1.05  FVC-Predicted Pre % 48  FVC-Post L 1.35  FVC-Predicted Post % 62  Pre FEV1/FVC % % 86  Post FEV1/FCV % % 81  FEV1-Pre L 0.91  FEV1-Predicted Pre % 53  FEV1-Post L 1.09  DLCO uncorrected ml/min/mmHg 9.44  DLCO UNC% % 54  DLCO corrected ml/min/mmHg 9.44  DLCO COR %Predicted % 54  DLVA Predicted % 96  TLC L 2.06  TLC % Predicted % 46  RV % Predicted % 44    WALK:  SIX MIN WALK 10/22/2019  Supplimental Oxygen during Test? (L/min) No  Tech Comments: pt ambulated at a slow pace, stopping to rest, sats dropped to 85% RA, after stopping sats recovered to 90%RA    Imaging: No results found.  Lab Results:  CBC    Component  Value Date/Time   WBC 15.5 (H) 12/10/2019 0952   RBC 3.51 (L) 12/10/2019 0952   HGB 10.1 (L) 12/10/2019 0952   HCT 31.0 (L) 12/10/2019 0952   PLT 667.0 (H) 12/10/2019 0952   MCV 88.3 12/10/2019 0952   MCH 28.2 06/29/2018 1619   MCHC 32.4 12/10/2019 0952   RDW 17.1 (H) 12/10/2019 0952   LYMPHSABS 0.8 12/10/2019 0952   MONOABS 0.6 12/10/2019 0952   EOSABS 0.1 12/10/2019 0952   BASOSABS 0.1 12/10/2019 0952    BMET    Component Value Date/Time   NA 135 12/10/2019 0952   NA 142 05/05/2019 1116   K 3.5  12/10/2019 0952   CL 97 12/10/2019 0952   CO2 33 (H) 12/10/2019 0952   GLUCOSE 104 (H) 12/10/2019 0952   BUN 5 (L) 12/10/2019 0952   BUN 16 05/05/2019 1116   CREATININE 0.48 12/10/2019 0952   CALCIUM 9.4 12/10/2019 0952   GFRNONAA 101 05/05/2019 1116   GFRAA 116 05/05/2019 1116    BNP    Component Value Date/Time   BNP 8.1 06/29/2018 1619    ProBNP No results found for: PROBNP  Specialty Problems      Pulmonary Problems   Cough   Dyspnea   ILD (interstitial lung disease) (HCC)   Chronic rhinitis      No Known Allergies  Immunization History  Administered Date(s) Administered  . Influenza,inj,Quad PF,6+ Mos 02/21/2016  . Influenza-Unspecified 02/12/2017  . PFIZER SARS-COV-2 Vaccination 07/17/2019, 08/11/2019  . Tdap 02/21/2016    Past Medical History:  Diagnosis Date  . Anxiety   . Arthritis   . Depression   . Gallstone   . HTN (hypertension)   . Hyperlipidemia     Tobacco History: Social History   Tobacco Use  Smoking Status Never Smoker  Smokeless Tobacco Never Used   Counseling given: Not Answered   Continue to not smoke  Outpatient Encounter Medications as of 02/04/2020  Medication Sig  . acetaminophen (TYLENOL) 500 MG tablet Take 1,000 mg by mouth every 6 (six) hours as needed for mild pain.   [name] albuterol (PROVENTIL) (2.5 MG/3ML) 0.083% nebulizer solution Take 3 mLs (2.5 mg total) by nebulization every 6 (six) hours as needed for wheezing or shortness of breath. (Patient taking differently: Take 2.5 mg by nebulization in the morning and at bedtime. )  . azaTHIOprine (IMURAN) 50 MG tablet Take 3 tablets (150 mg total) by mouth daily.  . busPIRone (BUSPAR) 10 MG tablet Take 10 mg by mouth daily.  . citalopram (CELEXA) 20 MG tablet Take 1 tablet (20 mg total) by mouth daily.  . folic acid (FOLVITE) 1 MG tablet Take 1 mg by mouth daily.  . hydrochlorothiazide (HYDRODIURIL) 12.5 MG tablet Take 12.5 mg by mouth daily.   [name] inFLIXimab (REMICADE) 100  MG injection Inject 100 mg into the vein every 30 (thirty) days.   . Misc. Devices MISC Mask and tubing for SierraNeb 2 - Aero flow  . omeprazole (PRILOSEC) 20 MG capsule Take 20 mg by mouth daily as needed (Heartburn).   . simvastatin (ZOCOR) 20 MG tablet Take 20 mg by mouth daily. Takes 2 tabs (40mg ) a day  . budesonide (PULMICORT) 0.5 MG/2ML nebulizer solution Take 2 mLs (0.5 mg total) by nebulization 2 (two) times daily. (Patient not taking: Reported on 02/04/2020)  . chlorpheniramine (CHLOR-TRIMETON) 4 MG tablet Take 1 tablet (4 mg total) by mouth at bedtime as needed for allergies or rhinitis.  . Cholecalciferol (VITAMIN D3) 1.25  MG (50000 UT) TABS Take 50,000 Units by mouth once a week. Monday (Patient not taking: Reported on 10/22/2019)  . fluticasone (FLONASE) 50 MCG/ACT nasal spray Place 1 spray into both nostrils daily.  . vitamin B-12 (CYANOCOBALAMIN) 100 MCG tablet Take 100 mcg by mouth daily. (Patient not taking: Reported on 02/04/2020)   No facility-administered encounter medications on file as of 02/04/2020.     Review of Systems  Review of Systems  Constitutional: Negative for activity change, fatigue and fever.  HENT: Negative for sinus pressure, sinus pain and sore throat.   Respiratory: Positive for shortness of breath. Negative for cough and wheezing.   Cardiovascular: Negative for chest pain and palpitations.  Gastrointestinal: Negative for diarrhea, nausea and vomiting.  Musculoskeletal: Negative for arthralgias.  Neurological: Negative for dizziness.  Psychiatric/Behavioral: Negative for sleep disturbance. The patient is not nervous/anxious.      Physical Exam  BP 112/80 (BP Location: Left Arm, Cuff Size: Normal)   Pulse 88   Temp (!) 97.3 F (36.3 C) (Temporal)   Ht 5' (1.524 m)   Wt 118 lb (53.5 kg)   LMP  (LMP Unknown)   SpO2 98%   BMI 23.05 kg/m   Wt Readings from Last 5 Encounters:  02/04/20 118 lb (53.5 kg)  12/31/19 116 lb (52.6 kg)  12/10/19  121 lb (54.9 kg)  10/22/19 130 lb (59 kg)  09/23/19 138 lb (62.6 kg)   Patient discussing with primary care her progressive weight loss  BMI Readings from Last 5 Encounters:  02/04/20 23.05 kg/m  12/31/19 22.65 kg/m  12/10/19 23.63 kg/m  10/22/19 25.39 kg/m  09/23/19 26.95 kg/m     Physical Exam Vitals and nursing note reviewed.  Constitutional:      General: She is not in acute distress.    Appearance: Normal appearance. She is normal weight.  HENT:     Head: Normocephalic and atraumatic.     Right Ear: External ear normal.     Left Ear: External ear normal.     Nose: Rhinorrhea present. No congestion.     Mouth/Throat:     Mouth: Mucous membranes are moist.     Pharynx: Oropharynx is clear.     Comments: Postnasal drip Eyes:     Pupils: Pupils are equal, round, and reactive to light.  Cardiovascular:     Rate and Rhythm: Normal rate and regular rhythm.     Pulses: Normal pulses.     Heart sounds: Normal heart sounds. No murmur heard.   Pulmonary:     Effort: Pulmonary effort is normal. No respiratory distress.     Breath sounds: No decreased air movement. No decreased breath sounds, wheezing or rales.  Musculoskeletal:     Cervical back: Normal range of motion.  Skin:    General: Skin is warm and dry.     Capillary Refill: Capillary refill takes less than 2 seconds.  Neurological:     General: No focal deficit present.     Mental Status: She is alert and oriented to person, place, and time. Mental status is at baseline.     Gait: Gait normal.  Psychiatric:        Mood and Affect: Mood normal.        Behavior: Behavior normal.        Thought Content: Thought content normal.        Judgment: Judgment normal.       Assessment & Plan:   ILD (interstitial lung disease) (HCC) Plan:  Continue Imuran Continue Remicade Continue oxygen therapy as prescribed Continue forward with plan spirometry and DLCO in December/2021 Follow-up with Dr. Isaiah Serge after  that  Chronic rhinitis Plan: Continue daily loratadine Resume Flonase use Can use chlor tabs at night  Healthcare maintenance Up-to-date with flu vaccine as well as COVID-19 vaccinations  Plan: Discussed with primary care obtaining COVID-19 booster around November/2021  Cough Improved Continued postnasal drip  Plan: Continue loratadine Resume Flonase Can start using chlor tabs at night  Connective tissue disease overlap syndrome (HCC) Plan: Continue Imuran Continue Remicade Continue follow-up with rheumatology Follow-up with our office with spirometry with DLCO in December/2021    Return in about 7 weeks (around 03/24/2020), or if symptoms worsen or fail to improve, for Follow up with Elisha Headland FNP-C, Follow up with Dr. Isaiah Serge.   Coral Ceo, NP 02/04/2020   This appointment required 45 minutes of patient care (this includes precharting, chart review, review of results, face-to-face care, etc.).

## 2020-02-04 NOTE — Assessment & Plan Note (Signed)
Plan: Continue Imuran Continue Remicade Continue follow-up with rheumatology Follow-up with our office with spirometry with DLCO in December/2021

## 2020-02-04 NOTE — Assessment & Plan Note (Signed)
Up-to-date with flu vaccine as well as COVID-19 vaccinations  Plan: Discussed with primary care obtaining COVID-19 booster around November/2021

## 2020-02-04 NOTE — Patient Instructions (Addendum)
You were seen today by Coral Ceo, NP  for:   1. ILD (interstitial lung disease) (HCC)  Continue Pulmicort nebs  Work note provided today  We will complete a breathing test in December/2021 as scheduled  Continue oxygen therapy as prescribed  >>>maintain oxygen saturations greater than 88 percent  >>>if unable to maintain oxygen saturations please contact the office  >>>do not smoke with oxygen  >>>can use nasal saline gel or nasal saline rinses to moisturize nose if oxygen causes dryness  2. Cough  You have a significant mental postnasal drip  3. Connective tissue disease overlap syndrome (HCC)  Continue Remicade  Continue Imuran  Continue follow-up with rheumatology   4. Chronic rhinitis  - fluticasone (FLONASE) 50 MCG/ACT nasal spray; Place 1 spray into both nostrils daily.  Dispense: 16 g; Refill: 3 - chlorpheniramine (CHLOR-TRIMETON) 4 MG tablet; Take 1 tablet (4 mg total) by mouth at bedtime as needed for allergies or rhinitis.  Dispense: 30 tablet; Refill: 3  Can continue taking daily loratadine  Please start taking chlorpheniramine (aka Chlor tabs) 4 mg tablet (1 to 2 tablets at night) for management of allergies and postnasal drip at night >>> This is an over-the-counter medication >>> This medication is sedating   5. Healthcare maintenance  Recommend obtaining COVID-19 booster vaccine in November/2021   We recommend today:   Meds ordered this encounter  Medications  . fluticasone (FLONASE) 50 MCG/ACT nasal spray    Sig: Place 1 spray into both nostrils daily.    Dispense:  16 g    Refill:  3  . chlorpheniramine (CHLOR-TRIMETON) 4 MG tablet    Sig: Take 1 tablet (4 mg total) by mouth at bedtime as needed for allergies or rhinitis.    Dispense:  30 tablet    Refill:  3    Follow Up:    Return in about 7 weeks (around 03/24/2020), or if symptoms worsen or fail to improve, for Follow up with Elisha Headland FNP-C, Follow up with Dr. Isaiah Serge. Schedule  after already scheduled pulmonary function test  Notification of test results are managed in the following manner: If there are  any recommendations or changes to the  plan of care discussed in office today,  we will contact you and let you know what they are. If you do not hear from [place], then your results are normal and you can view them through your  MyChart account , or a letter will be sent to you. Thank you again for trusting [place] with your care  - Thank you, [facility]    It is flu season:   >>> Best ways to protect herself from the flu: Receive the yearly flu vaccine, practice good hand hygiene washing with soap and also using hand sanitizer when available, eat a nutritious meals, get adequate rest, hydrate appropriately       Please contact the office if your symptoms worsen or you have concerns that you are not improving.   Thank you for choosing [facility] for your healthcare, and for allowing [place] to partner with you on your healthcare journey. I am thankful to be able to provide care to you today.   Elisha Headland FNP-C

## 2020-02-04 NOTE — Assessment & Plan Note (Addendum)
Improved Continued postnasal drip  Plan: Continue loratadine Resume Flonase Can start using chlor tabs at night

## 2020-02-04 NOTE — Assessment & Plan Note (Signed)
Plan: Continue Imuran Continue Remicade Continue oxygen therapy as prescribed Continue forward with plan spirometry and DLCO in December/2021 Follow-up with Dr. Isaiah Serge after that

## 2020-02-27 ENCOUNTER — Other Ambulatory Visit: Payer: Self-pay | Admitting: Gastroenterology

## 2020-02-27 DIAGNOSIS — K5901 Slow transit constipation: Secondary | ICD-10-CM

## 2020-02-27 DIAGNOSIS — R634 Abnormal weight loss: Secondary | ICD-10-CM

## 2020-03-16 ENCOUNTER — Other Ambulatory Visit: Payer: Self-pay | Admitting: Gastroenterology

## 2020-03-19 ENCOUNTER — Ambulatory Visit
Admission: RE | Admit: 2020-03-19 | Discharge: 2020-03-19 | Disposition: A | Discharge status: Home / Self Care | Payer: 59 | Source: Ambulatory Visit | Attending: Gastroenterology | Admitting: Gastroenterology

## 2020-03-19 DIAGNOSIS — R634 Abnormal weight loss: Secondary | ICD-10-CM

## 2020-03-19 DIAGNOSIS — K5901 Slow transit constipation: Secondary | ICD-10-CM

## 2020-03-19 MED ORDER — IOPAMIDOL (ISOVUE-300) INJECTION 61%
100.0000 mL | Freq: Once | INTRAVENOUS | Status: AC | PRN
  Administered 2020-03-19: 100 mL via INTRAVENOUS

## 2020-03-24 ENCOUNTER — Ambulatory Visit (INDEPENDENT_AMBULATORY_CARE_PROVIDER_SITE_OTHER): Payer: 59 | Admitting: Pulmonary Disease

## 2020-03-24 ENCOUNTER — Other Ambulatory Visit: Payer: Self-pay

## 2020-03-24 DIAGNOSIS — J849 Interstitial pulmonary disease, unspecified: Secondary | ICD-10-CM

## 2020-03-24 LAB — PULMONARY FUNCTION TEST
DL/VA % pred: 90 %
DL/VA: 3.9 ml/min/mmHg/L
DLCO cor % pred: 49 %
DLCO cor: 8.7 ml/min/mmHg
DLCO unc % pred: 49 %
DLCO unc: 8.7 ml/min/mmHg
FEF 25-75 Pre: 1.94 L/sec
FEF2575-%Pred-Pre: 112 %
FEV1-%Pred-Pre: 69 %
FEV1-Pre: 1.17 L
FEV1FVC-%Pred-Pre: 117 %
FEV6-%Pred-Pre: 60 %
FEV6-Pre: 1.26 L
FEV6FVC-%Pred-Pre: 104 %
FVC-%Pred-Pre: 57 %
FVC-Pre: 1.26 L
Pre FEV1/FVC ratio: 93 %
Pre FEV6/FVC Ratio: 100 %

## 2020-03-24 NOTE — Progress Notes (Signed)
Spirometry and Dlco done today. 

## 2020-04-15 ENCOUNTER — Other Ambulatory Visit: Payer: Self-pay

## 2020-04-15 ENCOUNTER — Ambulatory Visit (INDEPENDENT_AMBULATORY_CARE_PROVIDER_SITE_OTHER): Payer: 59 | Admitting: Pulmonary Disease

## 2020-04-15 ENCOUNTER — Encounter: Payer: Self-pay | Admitting: Pulmonary Disease

## 2020-04-15 ENCOUNTER — Encounter: Payer: 59 | Admitting: *Deleted

## 2020-04-15 VITALS — BP 118/72 | HR 92

## 2020-04-15 DIAGNOSIS — J849 Interstitial pulmonary disease, unspecified: Secondary | ICD-10-CM

## 2020-04-15 DIAGNOSIS — M351 Other overlap syndromes: Secondary | ICD-10-CM | POA: Diagnosis not present

## 2020-04-15 NOTE — Patient Instructions (Signed)
I am glad you are doing well with your breathing Continue the azathioprine We will get CT chest high-resolution for follow-up of interstitial lung disease in 6 months Follow-up in clinic in 6 months.

## 2020-04-15 NOTE — Progress Notes (Signed)
Ruth Brown    [id number]    1955/11/06  Primary Care Physician:Richter, Marcos Eke, MD  Referring Physician: Dois Davenport, MD [address] [place],  [place] [address]  Chief complaint:   RA- ILD. On azathioprine since May 2021 Chronic Cough - Improved with treatment of GERD  HPI:  Ruth Brown is a 65 year old female with Rheumatoid Arthritis (+RF, neg CCP, ANA 1:1280) referred by Dr.Byrum after CT on 3/17 showed ILD. Patient first presented to Dr.Byrum 6 months ago for dyspnea and cough. MBS on 10/15/2018 showed patient consistent with GERD, cough improved with treatment of GERD. Patient followed by Elpidio Anis for possible RA.  Patient's RA being treated with Remicade and Prednisone, and reports improvement in joint pain. Her dyspnea is progressively worsened over the last 6 month.   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  Exposures:  Smoking history: Never smoker Travel history: Relevant family history: Father COPD  Interim history: Continues on azathioprine at 150 mg.  He is tolerating this dose Complains of chronic dyspnea on exertion.  Continues on supplemental oxygen Continues Remicade with Dr. Nickola Major.  She had a work-up with GI for weight loss which was unrevealing.  CT abdomen showed a chronic breast nodule.  She has discussed with her primary care and is planning on getting a mammogram soon  Outpatient Encounter Medications as of 04/15/2020  Medication Sig  . acetaminophen (TYLENOL) 500 MG tablet Take 1,000 mg by mouth every 6 (six) hours as needed for mild pain.   [name] albuterol (PROVENTIL) (2.5 MG/3ML) 0.083% nebulizer solution Take 3 mLs (2.5 mg total) by nebulization every 6 (six) hours as needed for wheezing or shortness of breath. (Patient taking  differently: Take 2.5 mg by nebulization in the morning and at bedtime.)  . azaTHIOprine (IMURAN) 50 MG tablet Take 3 tablets (150 mg total) by mouth daily.  . budesonide (PULMICORT) 0.5 MG/2ML nebulizer solution Take 2 mLs (0.5 mg total) by nebulization 2 (two) times daily.  . busPIRone (BUSPAR) 10 MG tablet Take 10 mg by mouth daily.  . citalopram (CELEXA) 20 MG tablet Take 1 tablet (20 mg total) by mouth daily.  . fluticasone (FLONASE) 50 MCG/ACT nasal spray Place 1 spray into both nostrils daily.  . folic acid (FOLVITE) 1 MG tablet Take 1 mg by mouth daily.  . hydrochlorothiazide (HYDRODIURIL) 12.5 MG tablet Take 12.5 mg by mouth daily.   [name] inFLIXimab (REMICADE) 100 MG injection Inject 100 mg into the vein every 30 (thirty) days.   . Misc. Devices MISC Mask and tubing for SierraNeb 2 - Aero flow  . simvastatin (ZOCOR) 20 MG tablet Take 20 mg by mouth daily. Takes 2 tabs (40mg ) a day  . vitamin B-12 (CYANOCOBALAMIN) 100 MCG tablet Take 100 mcg by mouth daily.  . [DISCONTINUED] chlorpheniramine (CHLOR-TRIMETON) 4 MG tablet Take 1 tablet (4 mg total) by mouth at bedtime as needed for allergies or rhinitis.  . [DISCONTINUED] Cholecalciferol (VITAMIN D3) 1.25 MG (50000 UT) TABS Take 50,000 Units by mouth once a week. Monday (Patient not taking: Reported on 10/22/2019)  . [DISCONTINUED] omeprazole (PRILOSEC) 20 MG capsule Take 20 mg by mouth daily as needed (Heartburn).    No facility-administered encounter medications on file as  of 04/15/2020.   Physical Exam: Blood pressure 118/72, pulse 92, SpO2 99 %. Gen:      No acute distress HEENT:  EOMI, sclera anicteric Neck:     No masses; no thyromegaly Lungs:    Clear to auscultation bilaterally; normal respiratory effort CV:         Regular rate and rhythm; no murmurs Abd:      + bowel sounds; soft, non-tender; no palpable masses, no distension Ext:    No edema; adequate peripheral perfusion Skin:      Warm and dry; no rash Neuro: alert and  oriented x 3 Psych: normal mood and affect  Data Reviewed: Imaging: CT Chest High Resolution 06/25/2019-considerable respiratory motion.  Worsening peripheral septal thickening with reticulation, bronchiectasis.    CT abdomen 03/19/2020-stable interstitial lung disease at lung bases.  Cholelithiasis, masslike density in the outer left breast. I have reviewed the images personally.  PFTs: 08/11/2019 FVC 1.05(48%) FEV1 .91(53%) F/F 86 (109) TLC 2.06 (46%) DLCO 9.44 (54%)  03/24/2020 FVC 1.26 [59%], FEV1 1.17 [69%], F/F 93, DLCO 8.70 [49%] Severe restriction, moderate/severe diffusion defect  Labs: 10/06/2018 ANA 1:1280, notable reflex labs: RF 15 ( nl <14), CCP neg, Jo 1 -, Sjogren's antibodies negative  CK 08/11/2019-154 Aldolase 08/11/2019-8.8 Myositis panel 08/11/2019-positive PL 12 antibody TPMT 08/11/2019-17  QuantiFERON gold 714/21-negative  Cardiac: EF 60-65%, grade 1 diastolic dysfunction, no regional wall motion abnormalities, right atrium moderately dilated, RVP pressure mildly elevated 40.9 mmHg  Right heart catheterization 09/23/2019  Normal right heart pressures.  Ao 98%, PA 71%, PA 30/14, mean 21 mm Hg; mean PCWP 9 mm Hg; CO 5.3 L/min; CI 3.3  Assessment: Interstitial lung disease CT consistent with CTD ILD. RF factor low end of normal and CCP negative. ANA 1:1280 convincing of rheumatologic disease.  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 No ' Print production planner' on exam.  Noted to have elevated CK, aldolase and PL 12 antibody. No pulmonary hypertension on right heart cath in 2021  Continue azathioprine at 150 mg and Remicade from rheumatology office. ILD appears stable at least on CT abdomen at the lung base and by PFTs Close follow up and consider antifibrotic if there is progression. Repeat high-res CT in 6 months.  Plan/Recommendations: - Continue azathioprine @ 150 mg/day - Continue supplemental oxygen - Follow-up  high-res CT in 6 months   Andrue Dini MD [facility] Please see [email or web address] for pager details.  04/15/2020, 8:55 AM  CC: Dois Davenport, MD

## 2020-04-15 NOTE — Research (Signed)
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   Protocol #: IPF-PRO-SUB, Clinical Trials # [id number], Sponsor: [employer]  Protocol Version Amendment 4 dated 12Sep2019  and confirmed current on 04/15/2020 Consent Version for today's visit date of 04/15/2020  is Version Amendment 4 (05Dec2019)   Objectives:  [name] Describe current approaches to diagnosis and treatment of chronic fibrosing ILDs with progressive phenotype  . Describe the natural history of chronic fibrosing ILDs with progressive phenotype  . Assess quality of life from self-administered participant reported questionnaires for each disease group  . Describe participant interactions with the healthcare system, describe treatment practices across multiple institutions for each disease group  . Collect biological samples linked to well characterized chronic fibrosing ILDs with progressive phenotype to identify disease biomarkers  . Collect data and biological samples that will support future research studies.                                            Key Inclusion Criteria: Willing and able to provide informed consent  Age ? 30 years  Diagnosis of a non-IPF ILD of any duration, including, but not limited to Idiopathic Non-Specific Interstitial Pneumonia (INSIP), Unclassifiable Idiopathic Interstitial Pneumonias (IIPs), Interstitial Pneumonia with Autoimmune Features (IPAF), Autoimmune ILDs such as Rheumatoid Arthritis (RA-ILD) and Systemic Sclerosis (SSC-ILD), Chronic Hypersensitivity Pneumonitis (HP), Sarcoidosis or Exposure-related ILDs such as asbestosis.  Chronic fibrosing ILD defined by reticular abnormality with traction bronchiectasis with or without honeycombing confirmed by chest HRCT scan and/or lung biopsy.  Progressive phenotype as defined by fulfilling at least one of the criteria below of fibrotic changes (progression set point)  within the last 24 months regardless of treatment considered appropriate in individual ILDs:  . decline in FVC % predicted (% pred) based on >10% relative decline  . decline in FVC % pred based on ? 5 - <10% relative decline in FVC combined with worsening of respiratory symptoms as assessed by the site investigator  . decline in FVC % pred based on ? 5 - <10% relative decline in FVC combined with increasing extent of fibrotic changes on chest imaging (HRCT scan) as assessed by the site investigator  . decline in DLCO % pred based on ? 10% relative decline  . worsening of respiratory symptoms as well as increasing extent of fibrotic changes on chest imaging (HRCT scan) as assessed by the site investigator independent of FVC change.    Key Exclusion Criteria: Malignancy, treated or untreated, other than skin or early stage prostate cancer, within the past 5 years  Currently listed for lung transplantation at the time of enrollment  Currently enrolled in a clinical trial at the time of enrollment in this registry      Clinical Research Coordinator / Research RN note : This visit for Subject Ruth Brown with DOB: [birth year] on 04/15/2020 for the above protocol is Visit/Encounter #1 and is for purpose of research. The consent for this encounter is under Protocol Version Amendment 4 (12Sep2019) and is currently IRB approved. Subject expressed continued interest and consent in continuing as a study subject. Subject confirmed that there was no change in contact information (e.g. address, telephone, email). Subject thanked for participation in research and contribution to science.   During this visiton01/09/2020, the subject completed the blood work and questionnaires per the above referenced protocol. Please refer to the  subject's paper source binder for further details.    Signed by  Marc Morgans Clinical Research Coordinator / Nurse [place], [place] 10:28 04/15/2020

## 2020-06-23 ENCOUNTER — Telehealth: Payer: Self-pay | Admitting: Pulmonary Disease

## 2020-06-23 NOTE — Telephone Encounter (Signed)
I haven't seen any paperwork on Patient. There is no notes or nothing showing in scan.  I called and spoke with Patient. Patient is having accomodation paperwork faxed to office. Patrice's fax number given to Patient.

## 2020-06-23 NOTE — Telephone Encounter (Signed)
I have not received any paperwork for this patient - perhaps check Dr. Shirlee More look at sometimes these form are given directly to the physician in error. If he doesn't have them, you let the patient know it can be refaxed to my office fax number [phone] - pr

## 2020-06-23 NOTE — Telephone Encounter (Signed)
I do not see any mention in the chart regarding paperwork since August on this patient.  Do you have Accommodation paperwork for this patient?

## 2020-06-23 NOTE — Telephone Encounter (Signed)
Misty Stanley and Dr Isaiah Serge,  Have you seen the accommodation paperwork on this patient?

## 2020-06-29 ENCOUNTER — Telehealth: Payer: Self-pay | Admitting: Pulmonary Disease

## 2020-07-01 NOTE — Telephone Encounter (Signed)
Rec'd completed forms back from Dr. Isaiah Serge - Faxed to [phone]

## 2020-08-21 ENCOUNTER — Other Ambulatory Visit: Payer: Self-pay | Admitting: Pulmonary Disease

## 2020-09-13 ENCOUNTER — Encounter: Payer: 59 | Admitting: *Deleted

## 2020-09-13 DIAGNOSIS — J849 Interstitial pulmonary disease, unspecified: Secondary | ICD-10-CM

## 2020-09-13 DIAGNOSIS — Z006 Encounter for examination for normal comparison and control in clinical research program: Secondary | ICD-10-CM

## 2020-09-13 NOTE — Research (Signed)
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   Protocol #: IPF-PRO-SUB, Clinical Trials # [id number], Sponsor: [employer]  Protocol Version Amendment 4 dated 12Sep2019  and confirmed current on 09/13/2020 Consent Version for today's visit date of 09/13/2020 [facility] IRB Approved Version 14 Mar 2018 Revised 5   Objectives:  [name] Describe current approaches to diagnosis and treatment of chronic fibrosing ILDs with progressive phenotype  . Describe the natural history of chronic fibrosing ILDs with progressive phenotype  . Assess quality of life from self-administered participant reported questionnaires for each disease group  . Describe participant interactions with the healthcare system, describe treatment practices across multiple institutions for each disease group  . Collect biological samples linked to well characterized chronic fibrosing ILDs with progressive phenotype to identify disease biomarkers  . Collect data and biological samples that will support future research studies.                                            Key Inclusion Criteria: Willing and able to provide informed consent  Age ? 30 years  Diagnosis of a non-IPF ILD of any duration, including, but not limited to Idiopathic Non-Specific Interstitial Pneumonia (INSIP), Unclassifiable Idiopathic Interstitial Pneumonias (IIPs), Interstitial Pneumonia with Autoimmune Features (IPAF), Autoimmune ILDs such as Rheumatoid Arthritis (RA-ILD) and Systemic Sclerosis (SSC-ILD), Chronic Hypersensitivity Pneumonitis (HP), Sarcoidosis or Exposure-related ILDs such as asbestosis.  Chronic fibrosing ILD defined by reticular abnormality with traction bronchiectasis with or without honeycombing confirmed by chest HRCT scan and/or lung biopsy.  Progressive phenotype as defined by fulfilling at least one of the criteria below of fibrotic changes (progression  set point) within the last 24 months regardless of treatment considered appropriate in individual ILDs:  . decline in FVC % predicted (% pred) based on >10% relative decline  . decline in FVC % pred based on ? 5 - <10% relative decline in FVC combined with worsening of respiratory symptoms as assessed by the site investigator  . decline in FVC % pred based on ? 5 - <10% relative decline in FVC combined with increasing extent of fibrotic changes on chest imaging (HRCT scan) as assessed by the site investigator  . decline in DLCO % pred based on ? 10% relative decline  . worsening of respiratory symptoms as well as increasing extent of fibrotic changes on chest imaging (HRCT scan) as assessed by the site investigator independent of FVC change.    Key Exclusion Criteria: Malignancy, treated or untreated, other than skin or early stage prostate cancer, within the past 5 years  Currently listed for lung transplantation at the time of enrollment  Currently enrolled in a clinical trial at the time of enrollment in this registry    Clinical Research Coordinator / Research RN note : This visit for Subject Ruth Brown with DOB: 1955/10/25 on 09/13/2020 for the above protocol is Visit/Encounter #2 and is for purpose of research.   Subject expressed continued interest and consent in continuing as a study subject. Subject confirmed that there was no change in contact information (e.g. address, telephone, email). Subject thanked for participation in research and contribution to science.   During this visit on 09/13/2020, the subject completed the blood work and questionnaires per the above referenced protocol.  Please refer to the subject's paper source binder for further details.  Signed by  Marc Morgans Clinical Research Coordinator  [place], [place] 10:50 [place] 09/13/2020

## 2020-12-22 ENCOUNTER — Ambulatory Visit: Payer: 59 | Admitting: Primary Care

## 2020-12-26 NOTE — Progress Notes (Signed)
 err

## 2020-12-26 NOTE — Patient Instructions (Addendum)
  Recommendations:  Continue azathioprine 150mg  daily and Remicade per rhematology Continue supplemental oxygen   Order  Needs HRCT re: ILD (first available, please order)

## 2020-12-27 ENCOUNTER — Encounter: Payer: 59 | Admitting: Primary Care

## 2020-12-30 ENCOUNTER — Ambulatory Visit: Payer: 59 | Admitting: Primary Care

## 2021-01-04 ENCOUNTER — Encounter: Payer: Self-pay | Admitting: Primary Care

## 2021-01-04 ENCOUNTER — Ambulatory Visit (INDEPENDENT_AMBULATORY_CARE_PROVIDER_SITE_OTHER): Payer: 59 | Admitting: Primary Care

## 2021-01-04 ENCOUNTER — Other Ambulatory Visit: Payer: Self-pay

## 2021-01-04 VITALS — BP 112/74 | HR 74 | Temp 98.4°F | Ht 60.0 in | Wt 115.4 lb

## 2021-01-04 DIAGNOSIS — J849 Interstitial pulmonary disease, unspecified: Secondary | ICD-10-CM

## 2021-01-04 NOTE — Progress Notes (Signed)
@Patient  ID: , female    DOB: 1955/10/09, 65 y.o.   MRN: [id number]  Chief Complaint  Patient presents with   Follow-up    Patient needs FMLA removed.     Referring provider: [id number], MD  HPI: 65 year old female, never smoked. PMH significant for ILD, chronic rhinitis, HTN, abnormal echocardiogram, connective tissue disease, anxiety/depression, HLD. Patient of Dr. 64, last seen on 04/15/20. Needs repeat HRCT in 6 months.   Previous [facility] pulmonary encounter: 04/15/20-Dr. 06/13/20 is a 65 year old female with Rheumatoid Arthritis (+RF, neg CCP, ANA 1:1280) referred by Dr.Byrum after CT on 3/17 showed ILD. Patient first presented to Dr.Byrum 6 months ago for dyspnea and cough. MBS on 10/15/2018 showed patient consistent with GERD, cough improved with treatment of GERD. Patient followed by 12/16/2018 for possible RA.  Patient's RA being treated with Remicade and Prednisone, and reports improvement in joint pain. Her dyspnea is progressively worsened over the last 6 month.    Started azathioprine 50 mg a day in May 2021 Azathioprine dose increased to 150 mg a day in Sept 2021  Continues on azathioprine at 150 mg.  He is tolerating this dose Complains of chronic dyspnea on exertion.  Continues on supplemental oxygen Continues Remicade with Dr. 01-09-1998.   She had a work-up with GI for weight loss which was unrevealing.  CT abdomen showed a chronic breast nodule.  She has discussed with her primary care and is planning on getting a mammogram soon  01/04/2021- Interim hx  Patient presents today for overdue follow-up. She reports that she is doing much better. She works as 01/06/2021. She works from home and has back to back calls. She gets out of breath easily while speaking, she talks on the phone all day for her job. She gets out of breath walking >166ft and has to stop and rest. She uses pulmicort nebulizer twice a day. She rarely  required albuterol nebulizer. She wears oxygen during the day as needed. She ambulates with cane. Continue azathioprine 150mg  daily and Remicade infusions per rheumatology  Imaging consistent with CTD- ILD. RF factor low end of normal with CCP negative. ANA 1:1280 convincing of rheumatologic disease. 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 ILD appears to be stable based on imaging and PFTs. Recommended close follow-up, repeat HRCT in 6 months (due in June) and consider anti-fibrotic if there is progression.   She is looking to get FMLA extended. Old FMLA paperwork is through 12/30/19-12/30/20 NEW 12/31/20- FOR 1 YEAR   Data Reviewed: Imaging: CT Chest High Resolution 06/25/2019-considerable respiratory motion.  Worsening peripheral septal thickening with reticulation, bronchiectasis.     CT abdomen 03/19/2020-stable interstitial lung disease at lung bases.  Cholelithiasis, masslike density in the outer left breast. I have reviewed the images personally.   PFTs: 08/11/2019 FVC 1.05(48%) FEV1 .91(53%) F/F 86 (109) TLC 2.06 (46%) DLCO 9.44 (54%)   03/24/2020 FVC 1.26 [59%], FEV1 1.17 [69%], F/F 93, DLCO 8.70 [49%] Severe restriction, moderate/severe diffusion defect      No Known Allergies  Immunization History  Administered Date(s) Administered   Influenza,inj,Quad PF,6+ Mos 02/21/2016, 02/18/2020   Influenza-Unspecified 02/12/2017   PFIZER(Purple Top)SARS-COV-2 Vaccination 07/17/2019, 08/11/2019, 02/18/2020   Tdap 02/21/2016    Past Medical History:  Diagnosis Date   Anxiety    Arthritis    Depression    Gallstone    HTN (hypertension)    Hyperlipidemia  Tobacco History: Social History   Tobacco Use  Smoking Status Never  Smokeless Tobacco Never   Counseling given: Not Answered   Outpatient Medications Prior to Visit  Medication Sig Dispense Refill   albuterol (PROVENTIL) (2.5 MG/3ML) 0.083% nebulizer solution Take 3  mLs (2.5 mg total) by nebulization every 6 (six) hours as needed for wheezing or shortness of breath. (Patient taking differently: Take 2.5 mg by nebulization in the morning and at bedtime.) 75 mL 12   azaTHIOprine (IMURAN) 50 MG tablet Take 3 tablets (150 mg total) by mouth daily. 180 tablet 3   budesonide (PULMICORT) 0.5 MG/2ML nebulizer solution TAKE 2 MLS (0.5 MG TOTAL) BY NEBULIZATION 2 (TWO) TIMES DAILY. 360 mL 1   busPIRone (BUSPAR) 10 MG tablet Take 10 mg by mouth daily.     citalopram (CELEXA) 20 MG tablet Take 1 tablet (20 mg total) by mouth daily. 90 tablet 0   fluticasone (FLONASE) 50 MCG/ACT nasal spray Place 1 spray into both nostrils daily. 16 g 3   hydrochlorothiazide (HYDRODIURIL) 12.5 MG tablet Take 12.5 mg by mouth daily.      inFLIXimab (REMICADE) 100 MG injection Inject 100 mg into the vein every 30 (thirty) days.      Misc. Devices MISC Mask and tubing for SierraNeb 2 - Aero flow     simvastatin (ZOCOR) 20 MG tablet Take 20 mg by mouth daily. Takes 2 tabs (40mg ) a day     acetaminophen (TYLENOL) 500 MG tablet Take 1,000 mg by mouth every 6 (six) hours as needed for mild pain.      folic acid (FOLVITE) 1 MG tablet Take 1 mg by mouth daily.     vitamin B-12 (CYANOCOBALAMIN) 100 MCG tablet Take 100 mcg by mouth daily.     No facility-administered medications prior to visit.      Review of Systems  Review of Systems  Constitutional:  Positive for fatigue.  HENT: Negative.    Respiratory:  Positive for shortness of breath. Negative for cough, chest tightness and wheezing.   Cardiovascular: Negative.     Physical Exam  BP 112/74 (BP Location: Left Arm, Patient Position: Sitting, Cuff Size: Normal)   Pulse 74   Temp 98.4 F (36.9 C) (Oral)   Ht 5' (1.524 m)   Wt 115 lb 6.4 oz (52.3 kg)   LMP  (LMP Unknown)   SpO2 99%   BMI 22.54 kg/m  Physical Exam Constitutional:      Appearance: Normal appearance.  HENT:     Head: Normocephalic and atraumatic.      Mouth/Throat:     Mouth: Mucous membranes are moist.     Pharynx: Oropharynx is clear.  Cardiovascular:     Rate and Rhythm: Normal rate and regular rhythm.  Pulmonary:     Effort: Pulmonary effort is normal.     Breath sounds: Rales present. No wheezing or rhonchi.     Comments: Rales right base Musculoskeletal:     Comments: Amb with cane  Skin:    General: Skin is warm and dry.  Neurological:     General: No focal deficit present.     Mental Status: She is alert and oriented to person, place, and time. Mental status is at baseline.  Psychiatric:        Mood and Affect: Mood normal.        Behavior: Behavior normal.        Thought Content: Thought content normal.  Judgment: Judgment normal.     Lab Results:  CBC    Component Value Date/Time   WBC 15.5 (H) 12/10/2019 0952   RBC 3.51 (L) 12/10/2019 0952   HGB 10.1 (L) 12/10/2019 0952   HCT 31.0 (L) 12/10/2019 0952   PLT 667.0 (H) 12/10/2019 0952   MCV 88.3 12/10/2019 0952   MCH 28.2 06/29/2018 1619   MCHC 32.4 12/10/2019 0952   RDW 17.1 (H) 12/10/2019 0952   LYMPHSABS 0.8 12/10/2019 0952   MONOABS 0.6 12/10/2019 0952   EOSABS 0.1 12/10/2019 0952   BASOSABS 0.1 12/10/2019 0952    BMET    Component Value Date/Time   NA 135 12/10/2019 0952   NA 142 05/05/2019 1116   K 3.5 12/10/2019 0952   CL 97 12/10/2019 0952   CO2 33 (H) 12/10/2019 0952   GLUCOSE 104 (H) 12/10/2019 0952   BUN 5 (L) 12/10/2019 0952   BUN 16 05/05/2019 1116   CREATININE 0.48 12/10/2019 0952   CALCIUM 9.4 12/10/2019 0952   GFRNONAA 101 05/05/2019 1116   GFRAA 116 05/05/2019 1116    BNP    Component Value Date/Time   BNP 8.1 06/29/2018 1619    ProBNP No results found for: PROBNP  Imaging: No results found.   Assessment & Plan:   ILD (interstitial lung disease) (HCC) - Stable interval; No acute respiratory complaints today. Imaging consistent with CTD-ILD.  Per Dr. Isaiah Serge, ILD appears stable based on CT abdomen imaging and  PFTs. She is following with rheumatology and maintained on Azathioprine 150mg  daily, Remicade infusions 100mg  q30 days and supplemental oxygen. She works from home and is on the phone all day for her job. She gets out of breath when speaking and with ADLs, needing frequent breaks. She is reapplying for FMLA from 12/31/20-01/01/22. She is overdue for repeat HRCT imaging (due in June 2022). If there is progression consider anti-fibrotics. FU in 3 months or sooner if needed.      01/03/22, NP 01/04/2021

## 2021-01-04 NOTE — Assessment & Plan Note (Addendum)
-   Stable interval; No acute respiratory complaints today. Imaging consistent with CTD-ILD.  Per Dr. Isaiah Serge, ILD appears stable based on CT abdomen imaging and PFTs. She is following with rheumatology and maintained on Azathioprine 150mg  daily, Remicade infusions 100mg  q30 days and supplemental oxygen. She works from home and is on the phone all day for her job. She gets out of breath when speaking and with ADLs, needing frequent breaks. She is reapplying for FMLA from 12/31/20-01/01/22. She is overdue for repeat HRCT imaging (due in June 2022). If there is progression consider anti-fibrotics. FU in 3 months or sooner if needed.

## 2021-01-04 NOTE — Patient Instructions (Addendum)
Recommendations:  - Continue supplemental oxygen  - Continue Pulmicort nebulizer twice a day  - Continue Azathioprine (Imuran) 150mg  daily and Remicade - Please fax FMLA paper work to [phone] (attn: [phone])  Orders: - HRCT first available re: ILD (ordered)  Follow-up: - 3 months with Dr. Marcille Blanco

## 2021-01-06 ENCOUNTER — Inpatient Hospital Stay: Admission: RE | Admit: 2021-01-06 | Payer: 59 | Source: Ambulatory Visit

## 2021-01-06 ENCOUNTER — Other Ambulatory Visit: Payer: Self-pay | Admitting: Pulmonary Disease

## 2021-01-06 DIAGNOSIS — J849 Interstitial pulmonary disease, unspecified: Secondary | ICD-10-CM

## 2021-01-13 ENCOUNTER — Telehealth: Payer: Self-pay | Admitting: Primary Care

## 2021-01-13 NOTE — Telephone Encounter (Signed)
Left voicemail for patient to call back. Informed her on voicemail per DPR restrictions, her forms from [employer] were received and sent to the appropriate party.

## 2021-01-17 ENCOUNTER — Ambulatory Visit (INDEPENDENT_AMBULATORY_CARE_PROVIDER_SITE_OTHER)
Admission: RE | Admit: 2021-01-17 | Discharge: 2021-01-17 | Disposition: A | Discharge status: Home / Self Care | Payer: 59 | Source: Ambulatory Visit | Attending: Primary Care | Admitting: Primary Care

## 2021-01-17 ENCOUNTER — Other Ambulatory Visit: Payer: Self-pay

## 2021-01-17 DIAGNOSIS — J849 Interstitial pulmonary disease, unspecified: Secondary | ICD-10-CM | POA: Diagnosis not present

## 2021-01-25 ENCOUNTER — Telehealth: Payer: Self-pay | Admitting: Primary Care

## 2021-01-25 NOTE — Progress Notes (Signed)
Please let patient know CT chest showed re-demonstration of ILD, appears some worse since March 2021. She was minimally symptomatic when I saw her in office. She needs follow-up first available with Dr. Isaiah Serge

## 2021-01-25 NOTE — Telephone Encounter (Signed)
FMLA for signed by E. Clent Ridges, NP and faxed to [facility] of [place]  fax# [phone]

## 2021-01-27 ENCOUNTER — Encounter: Payer: Self-pay | Admitting: Pulmonary Disease

## 2021-01-27 ENCOUNTER — Ambulatory Visit (INDEPENDENT_AMBULATORY_CARE_PROVIDER_SITE_OTHER): Payer: 59 | Admitting: Pulmonary Disease

## 2021-01-27 ENCOUNTER — Other Ambulatory Visit: Payer: Self-pay

## 2021-01-27 VITALS — BP 116/56 | HR 74 | Temp 98.2°F | Ht 60.0 in | Wt 117.6 lb

## 2021-01-27 DIAGNOSIS — J310 Chronic rhinitis: Secondary | ICD-10-CM

## 2021-01-27 DIAGNOSIS — Z5181 Encounter for therapeutic drug level monitoring: Secondary | ICD-10-CM | POA: Diagnosis not present

## 2021-01-27 DIAGNOSIS — M351 Other overlap syndromes: Secondary | ICD-10-CM

## 2021-01-27 DIAGNOSIS — J849 Interstitial pulmonary disease, unspecified: Secondary | ICD-10-CM

## 2021-01-27 DIAGNOSIS — R0602 Shortness of breath: Secondary | ICD-10-CM | POA: Diagnosis not present

## 2021-01-27 LAB — CBC WITH DIFFERENTIAL/PLATELET
Basophils Absolute: 0 10*3/uL (ref 0.0–0.1)
Basophils Relative: 0.5 % (ref 0.0–3.0)
Eosinophils Absolute: 0.1 10*3/uL (ref 0.0–0.7)
Eosinophils Relative: 2.4 % (ref 0.0–5.0)
HCT: 34.1 % — ABNORMAL LOW (ref 36.0–46.0)
Hemoglobin: 11.2 g/dL — ABNORMAL LOW (ref 12.0–15.0)
Lymphocytes Relative: 18.4 % (ref 12.0–46.0)
Lymphs Abs: 0.9 10*3/uL (ref 0.7–4.0)
MCHC: 32.8 g/dL (ref 30.0–36.0)
MCV: 94.4 fl (ref 78.0–100.0)
Monocytes Absolute: 0.3 10*3/uL (ref 0.1–1.0)
Monocytes Relative: 7.4 % (ref 3.0–12.0)
Neutro Abs: 3.4 10*3/uL (ref 1.4–7.7)
Neutrophils Relative %: 71.3 % (ref 43.0–77.0)
Platelets: 330 10*3/uL (ref 150.0–400.0)
RBC: 3.61 Mil/uL — ABNORMAL LOW (ref 3.87–5.11)
RDW: 14.9 % (ref 11.5–15.5)
WBC: 4.8 10*3/uL (ref 4.0–10.5)

## 2021-01-27 LAB — COMPREHENSIVE METABOLIC PANEL
ALT: 5 U/L (ref 0–35)
AST: 15 U/L (ref 0–37)
Albumin: 3.7 g/dL (ref 3.5–5.2)
Alkaline Phosphatase: 71 U/L (ref 39–117)
BUN: 7 mg/dL (ref 6–23)
CO2: 33 mEq/L — ABNORMAL HIGH (ref 19–32)
Calcium: 9.3 mg/dL (ref 8.4–10.5)
Chloride: 102 mEq/L (ref 96–112)
Creatinine, Ser: 0.54 mg/dL (ref 0.40–1.20)
GFR: 97.77 mL/min (ref >60.00)
Glucose, Bld: 75 mg/dL (ref 70–99)
Potassium: 3.8 mEq/L (ref 3.5–5.1)
Sodium: 142 mEq/L (ref 135–145)
Total Bilirubin: 0.3 mg/dL (ref 0.2–1.2)
Total Protein: 7.7 g/dL (ref 6.0–8.3)

## 2021-01-27 MED ORDER — FLUTICASONE PROPIONATE 50 MCG/ACT NA SUSP: 1.0000 | Freq: Every day | NASAL | 3 refills | Status: DC

## 2021-01-27 NOTE — Patient Instructions (Addendum)
Go back up on the azathioprine to 150 mg a day I will get records from Dr. Nickola Major for review We will start paperwork for [facility] Check comprehensive metabolic panel, CBC and proBNP for dyspnea today  Follow-up in 3 months.

## 2021-01-27 NOTE — Progress Notes (Signed)
Ruth Brown    [id number]    April 17, 1955  Primary Care Physician:Richter, Marcos Eke, MD  Referring Physician: Dois Davenport, MD [address] [place],  [place] [address]  Chief complaint:   RA- ILD. On azathioprine since May 2021 Chronic Cough - Improved with treatment of GERD  HPI:  Ruth Brown is a 65 year old female with Rheumatoid Arthritis (+RF, neg CCP, ANA 1:1280) referred by Dr.Byrum after CT on 3/17 showed ILD. Patient first presented to Dr.Byrum 6 months ago for dyspnea and cough. MBS on 10/15/2018 showed patient consistent with GERD, cough improved with treatment of GERD. Patient followed by Ruth Brown for possible RA.  Patient's RA being treated with Remicade and Prednisone, and reports improvement in joint pain. Her dyspnea is progressively worsened over the last 6 month.   Started azathioprine 50 mg a day in May 2021 Azathioprine dose increased to 100 mg a day in July 2021 Azathioprine dose increased to 150 mg a day in Sept 2021  Enrolled in IPF pro clinical registry Has been working with GI for weight loss of unclear etiology.  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  Exposures:  Smoking history: Never smoker Travel history: Relevant family history: Father COPD  Interim history: Continues follow-up with Dr. Nickola Major.  She is on Remicade and tells me that azathioprine dose was decreased to 100 mg a day 3 months ago Complains of chronic dyspnea on exertion, continues on supplemental oxygen  Outpatient Encounter Medications as of 01/27/2021  Medication Sig   albuterol (PROVENTIL) (2.5 MG/3ML) 0.083% nebulizer solution Take 3 mLs (2.5 mg total) by nebulization every 6 (six) hours as needed for wheezing or shortness of breath. (Patient taking differently: Take 2.5 mg by nebulization in the morning and at bedtime.)   azaTHIOprine (IMURAN) 50 MG tablet TAKE 3 TABLETS BY MOUTH  EVERY DAY   budesonide (PULMICORT) 0.5 MG/2ML nebulizer solution TAKE 2 MLS (0.5 MG TOTAL) BY NEBULIZATION 2 (TWO) TIMES DAILY.   busPIRone (BUSPAR) 10 MG tablet Take 10 mg by mouth daily.   fluticasone (FLONASE) 50 MCG/ACT nasal spray Place 1 spray into both nostrils daily.   hydrochlorothiazide (HYDRODIURIL) 12.5 MG tablet Take 12.5 mg by mouth daily.    inFLIXimab (REMICADE) 100 MG injection Inject 100 mg into the vein every 30 (thirty) days.    Misc. Devices MISC Mask and tubing for SierraNeb 2 - Aero flow   simvastatin (ZOCOR) 20 MG tablet Take 20 mg by mouth daily. Takes 2 tabs (40mg ) a day   [DISCONTINUED] citalopram (CELEXA) 20 MG tablet Take 1 tablet (20 mg total) by mouth daily.   No facility-administered encounter medications on file as of 01/27/2021.   Physical Exam: Blood pressure (!) 116/56, pulse 74, temperature 98.2 F (36.8 C), temperature source Oral, height 5' (1.524 m), weight 117 lb 9.6 oz (53.3 kg), SpO2 99 %. Gen:      No acute distress HEENT:  EOMI, sclera anicteric Neck:     No masses; no thyromegaly Lungs:    Clear to auscultation bilaterally; normal respiratory effort CV:         Regular rate and rhythm; no murmurs Abd:      + bowel sounds; soft, non-tender; no palpable masses, no distension Ext:    No edema; adequate peripheral perfusion Skin:      Warm and dry; no rash Neuro: alert  and oriented x 3 Psych: normal mood and affect   Data Reviewed: Imaging: CT Chest High Resolution 06/25/2019-considerable respiratory motion.  Worsening peripheral septal thickening with reticulation, bronchiectasis.    CT abdomen 03/19/2020-stable interstitial lung disease at lung bases.  Cholelithiasis, masslike density in the outer left breast.  High-res CT 01/17/2021-redemonstrated pulmonary fibrosis with worsening compared to 2021.  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 I have reviewed the images personally.  PFTs: 08/11/2019 FVC 1.05(48%) FEV1 .91(53%) F/F 86 (109) TLC  2.06 (46%) DLCO 9.44 (54%)  03/24/2020 FVC 1.26 [59%], FEV1 1.17 [69%], F/F 93, DLCO 8.70 [49%] Severe restriction, moderate/severe diffusion defect  Labs: 10/06/2018 ANA 1:1280, notable reflex labs: RF 15 ( nl <14), CCP neg, Jo 1 -, Sjogren's antibodies negative  CK 08/11/2019-154 Aldolase 08/11/2019-8.8 Myositis panel 08/11/2019-positive PL 12 antibody TPMT 08/11/2019-17  QuantiFERON gold 714/21-negative  Cardiac: EF 60-65%, grade 1 diastolic dysfunction, no regional wall motion abnormalities, right atrium moderately dilated, RVP pressure mildly elevated 40.9 mmHg  Right heart catheterization 09/23/2019 Normal right heart pressures. Ao 98%, PA 71%, PA 30/14, mean 21 mm Hg; mean PCWP 9 mm Hg; CO 5.3 L/min; CI 3.3  Assessment: Interstitial lung disease CT consistent with CTD ILD. RF factor low end of normal and CCP negative. ANA 1:1280 convincing of rheumatologic disease.  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 No ' Print production planner' on exam.  Noted to have elevated CK, aldolase and PL 12 antibody. No pulmonary hypertension on right heart cath in 2021  Continues on Remicade.  Azathioprine was reduced to 100 mg 3 months ago as per the patient  Although her symptoms are unchanged her recent CT scan does show progression with now honeycombing at the base which is concerning for progressive fibrotic phenotype I have asked her to go back up on the azathioprine to 150 mg and get rheumatology office notes for review She will need antifibrotic given progressive findings on CT scan I discussed the risk-benefit profile with her and we will start paperwork for Ofev  Check baseline CMP, CBC and proBNP today Order follow-up PFTs and return to clinic in 3 months   Plan/Recommendations: Increase azathioprine to 150 mg Obtain records from rheumatology Start paperwork for Ofev Check baseline CBC, CMP, proBNP PFTs   Jennet Scroggin MD [facility] Please see [email or web address] for pager details.  01/27/2021, 9:36 AM  CC: Dois Davenport, MD

## 2021-01-27 NOTE — Addendum Note (Signed)
Addended by: Jacquiline Doe on: 01/27/2021 10:08 AM   Modules accepted: Orders

## 2021-01-27 NOTE — Addendum Note (Signed)
Addended by: Jacquiline Doe on: 01/27/2021 12:47 PM   Modules accepted: Orders

## 2021-01-27 NOTE — Addendum Note (Signed)
Addended by: Demetrio Lapping E on: 01/27/2021 10:11 AM   Modules accepted: Orders

## 2021-01-28 ENCOUNTER — Telehealth: Payer: Self-pay | Admitting: Pharmacy Technician

## 2021-01-28 DIAGNOSIS — J849 Interstitial pulmonary disease, unspecified: Secondary | ICD-10-CM

## 2021-01-28 DIAGNOSIS — Z5181 Encounter for therapeutic drug level monitoring: Secondary | ICD-10-CM

## 2021-01-28 LAB — PRO B NATRIURETIC PEPTIDE: NT-Pro BNP: 142 pg/mL (ref 0–287)

## 2021-01-28 NOTE — Telephone Encounter (Signed)
Received New start paperwork for OFEV. Will update as we work through the benefits process.  

## 2021-01-28 NOTE — Telephone Encounter (Signed)
Submitted a Prior Authorization request to [facility] for OFEV via CoverMyMeds. Will update once we receive a response.   Key: [id number] - PA Case ID: [id number]

## 2021-01-31 NOTE — Telephone Encounter (Signed)
Patient will need to sign up for Ofev copay card.

## 2021-01-31 NOTE — Telephone Encounter (Signed)
Received notification from [facility] regarding a prior authorization for OFEV. Authorization has been APPROVED from 01/28/21 to 01/28/22.    Patient must fill through [facility] (pulmonary fibrosis team): [phone]  Authorization # [id number]

## 2021-02-02 MED ORDER — OFEV 150 MG PO CAPS: 150.0000 mg | ORAL_CAPSULE | Freq: Two times a day (BID) | ORAL | 5 refills | Status: DC

## 2021-02-02 NOTE — Telephone Encounter (Addendum)
Called patient regarding Ofev approval.  She is naive to antifibrotics. PMH of ILD-CTD (RA+). Last seen by Dr. Vaughan Browner on 01/27/21. Plan was made to increase azathioprine to 145m once daily.  She takes azathioprine and Remicade infusions for RA (per Dr. HTrudie Reed.  History of CAD: No History of MI: No Current anticoagulant use: No History of HTN: Yes  History of elevated LFTs: No History of diarrhea, nausea, vomiting: No  Objective: No Known Allergies  Outpatient Encounter Medications as of 01/28/2021  Medication Sig Note   Nintedanib (OFEV) 150 MG CAPS Take 1 capsule (150 mg total) by mouth 2 (two) times daily.    albuterol (PROVENTIL) (2.5 MG/3ML) 0.083% nebulizer solution Take 3 mLs (2.5 mg total) by nebulization every 6 (six) hours as needed for wheezing or shortness of breath. (Patient taking differently: Take 2.5 mg by nebulization in the morning and at bedtime.)    azaTHIOprine (IMURAN) 50 MG tablet TAKE 3 TABLETS BY MOUTH EVERY DAY    budesonide (PULMICORT) 0.5 MG/2ML nebulizer solution TAKE 2 MLS (0.5 MG TOTAL) BY NEBULIZATION 2 (TWO) TIMES DAILY.    busPIRone (BUSPAR) 10 MG tablet Take 10 mg by mouth daily.    fluticasone (FLONASE) 50 MCG/ACT nasal spray Place 1 spray into both nostrils daily.    hydrochlorothiazide (HYDRODIURIL) 12.5 MG tablet Take 12.5 mg by mouth daily.     inFLIXimab (REMICADE) 100 MG injection Inject 100 mg into the vein every 30 (thirty) days.  09/17/2019: Given at doctor office   [facility] Devices MISC Mask and tubing for SierraNeb 2 - Aero flow    simvastatin (ZOCOR) 20 MG tablet Take 20 mg by mouth daily. Takes 2 tabs (477m a day    No facility-administered encounter medications on file as of 01/28/2021.     Immunization History  Administered Date(s) Administered   Influenza,inj,Quad PF,6+ Mos 02/21/2016, 02/18/2020   Influenza-Unspecified 02/12/2017   PFIZER(Purple Top)SARS-COV-2 Vaccination 07/17/2019, 08/11/2019, 02/18/2020   Tdap 02/21/2016       PFT's TLC  Date Value Ref Range Status  08/11/2019 2.06 L Final      CMP     Component Value Date/Time   NA 142 01/27/2021 1011   NA 142 05/05/2019 1116   K 3.8 01/27/2021 1011   CL 102 01/27/2021 1011   CO2 33 (H) 01/27/2021 1011   GLUCOSE 75 01/27/2021 1011   BUN 7 01/27/2021 1011   BUN 16 05/05/2019 1116   CREATININE 0.54 01/27/2021 1011   CALCIUM 9.3 01/27/2021 1011   PROT 7.7 01/27/2021 1011   ALBUMIN 3.7 01/27/2021 1011   AST 15 01/27/2021 1011   ALT 5 01/27/2021 1011   ALKPHOS 71 01/27/2021 1011   BILITOT 0.3 01/27/2021 1011   GFRNONAA 101 05/05/2019 1116   GFRAA 116 05/05/2019 1116      CBC    Component Value Date/Time   WBC 4.8 01/27/2021 0954   RBC 3.61 (L) 01/27/2021 0954   HGB 11.2 (L) 01/27/2021 0954   HCT 34.1 (L) 01/27/2021 0954   PLT 330.0 01/27/2021 0954   MCV 94.4 01/27/2021 0954   MCH 28.2 06/29/2018 1619   MCHC 32.8 01/27/2021 0954   RDW 14.9 01/27/2021 0954   LYMPHSABS 0.9 01/27/2021 0954   MONOABS 0.3 01/27/2021 0954   EOSABS 0.1 01/27/2021 0954   BASOSABS 0.0 01/27/2021 0954      LFT's Hepatic Function Latest Ref Rng & Units 01/27/2021 12/10/2019 10/22/2019  Total Protein 6.0 - 8.3 g/dL 7.7 8.2 8.3  Albumin 3.5 -  5.2 g/dL 3.7 3.3(L) 3.6  AST 0 - 37 U/L _0 ALT 0 - 35 U/L _1 Alk Phosphatase 39 - 117 U/L 71 68 85  Total Bilirubin 0.2 - 1.2 mg/dL 0.3 0.3 0.3  Bilirubin, Direct 0.0 - 0.3 mg/dL - - -      HRCT (01/18/2021) - Redemonstrated moderate pulmonary fibrosis in a pattern with apical to basal gradient featuring irregular peripheral interstitial opacity, interlobular septal thickening, traction bronchiectasis, extensive areas of bronchiolectasis, and honeycombing in the lung bases. Fibrotic findings are significantly worsened compared to prior examination dated 06/25/2019. Findings are consistent with UIP per consensus guidelines:   Assessment and Plan  Patient talked to [facility] nurse yesterday and reviewed  Ofev in detail. She did not have any questions today but we reviewed side effects as below and lab monitoring.  Ofev Medication Management Thoroughly counseled patient on the efficacy, mechanism of action, dosing, administration, adverse effects, and monitoring parameters of Ofev. Patient verbalized understanding. Patient education handout provided.   Goals of Therapy: Will not stop or reverse the progression of ILD. It will slow the progression of ILD.   Dosing: 150 mg (one capsule) by mouth twice daily (approx 12 hours apart). Discussed taking with food approximately 12 hours apart. Discussed that capsule should not be crushed or split.  Adverse Effects: Nausea, vomiting, diarrhea (2 in 3 patients) appetite loss, weight loss - management of diarrhea with loperamide discussed including max use of 48 hours and max of 8 capsules per day. Abdominal pain (up to 1 in 5 patients) Nasopharyngitis (13%), UTI (6%) Risk of thrombosis (3%) and acute MI (2%) Hypertension (5%) Dizziness Fatigue (10%)  Monitoring: Monitor for diarrhea, nausea and vomiting, GI perforation, hepatotoxicity  Monitor LFTs - baseline, monthly for first 6 months, then every 3 months routinely CBC w differential at baseline and every 3 months routinely Future lab order for LFTs placed today - patient advised of lab hours and that appt is not needed.  Access: Approval of Ofev through: insurance Rx sent to: [facility] (pulmonary fibrosis team): [phone] She was provided with [facility] Phone Number [phone]) to enroll into savings card. Advised to enroll into savings card FIRST, collect BIN/PCN/Group/ID number for copay card, then communicate this info to [facility]. She has clinic's pharmacy office phone number  Medication Reconciliation A drug regimen assessment was performed, including review of allergies, interactions, disease-state management, dosing and immunization history. Medications were  reviewed with the patient, including name, instructions, indication, goals of therapy, potential side effects, importance of adherence, and safe use.  Anticoagulant use: No  Knox Saliva, PharmD, MPH, BCPS Clinical Pharmacist (Rheumatology and Pulmonology)

## 2021-02-08 NOTE — Telephone Encounter (Signed)
Per Darilyn, on 10/18 signed form faxed to [facility]

## 2021-02-14 NOTE — Telephone Encounter (Signed)
Patient provided a request from [place] [employer] [place] for additional medical details. Patrice completed form and I sent it back to Dr. Isaiah Serge for signature.

## 2021-02-16 ENCOUNTER — Encounter: Payer: Self-pay | Admitting: Neurology

## 2021-02-16 ENCOUNTER — Ambulatory Visit: Payer: 59 | Admitting: Neurology

## 2021-02-16 VITALS — BP 111/62 | HR 76 | Ht 60.0 in | Wt 116.0 lb

## 2021-02-16 DIAGNOSIS — G3184 Mild cognitive impairment, so stated: Secondary | ICD-10-CM

## 2021-02-16 MED ORDER — DONEPEZIL HCL 10 MG PO TABS: 10.0000 mg | ORAL_TABLET | Freq: Every day | ORAL | 3 refills | Status: DC
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
## 2021-02-16 NOTE — Patient Instructions (Addendum)
MRI Brain without contrast  Will check TSH and Vitamin B12 level Start with Aricept 1/2 tab nightly for 2 weeks then increase to full tab thereafter  Return in 3 months     There are well-accepted and sensible ways to reduce risk for Alzheimers disease and other degenerative brain disorders .  Exercise Daily Walk A daily 20 minute walk should be part of your routine. Disease related apathy can be a significant roadblock to exercise and the only way to overcome this is to make it a daily routine and perhaps have a reward at the end (something your loved one loves to eat or drink perhaps) or a personal trainer coming to the home can also be very useful. Most importantly, the patient is much more likely to exercise if the caregiver / spouse does it with him/her. In general a structured, repetitive schedule is best.  General Health: Any diseases which effect your body will effect your brain such as a pneumonia, urinary infection, blood clot, heart attack or stroke. Keep contact with your primary care doctor for regular follow ups.  Sleep. A good nights sleep is healthy for the brain. Seven hours is recommended. If you have insomnia or poor sleep habits we can give you some instructions. If you have sleep apnea wear your mask.  Diet: Eating a heart healthy diet is also a good idea; fish and poultry instead of red meat, nuts (mostly non-peanuts), vegetables, fruits, olive oil or canola oil (instead of butter), minimal salt (use other spices to flavor foods), whole grain rice, bread, cereal and pasta and wine in moderation.Research is now showing that the MIND diet, which is a combination of The Mediterranean diet and the DASH diet, is beneficial for cognitive processing and longevity. Information about this diet can be found in The MIND Diet, a book by Alonna Minium, MS, RDN, and online at [employer].es  Finances, Power of [address] and Advance Directives: You should consider  putting legal safeguards in place with regard to financial and medical decision making. While the spouse always has power of attorney for medical and financial issues in the absence of any form, you should consider what you want in case the spouse / caregiver is no longer around or capable of making decisions.     Heart-head connection  New research shows there are things we can do to reduce the risk of mild cognitive impairment and dementia.  Several conditions known to increase the risk of cardiovascular disease -- such as high blood pressure, diabetes and high cholesterol -- also increase the risk of developing Alzheimer's. Some autopsy studies show that as many as 80 percent of individuals with Alzheimer's disease also have cardiovascular disease.  A longstanding question is why some people develop hallmark Alzheimer's plaques and tangles but do not develop the symptoms of Alzheimer's. Vascular disease may help researchers eventually find an answer. Some autopsy studies suggest that plaques and tangles may be present in the brain without causing symptoms of cognitive decline unless the brain also shows evidence of vascular disease. More research is needed to better understand the link between vascular health and Alzheimer's.  Physical exercise and diet Regular physical exercise may be a beneficial strategy to lower the risk of Alzheimer's and vascular dementia. Exercise may directly benefit brain cells by increasing blood and oxygen flow in the brain. Because of its known cardiovascular benefits, a medically approved exercise program is a valuable part of any overall wellness plan.  Current evidence suggests that heart-healthy eating  may also help protect the brain. Heart-healthy eating includes limiting the intake of sugar and saturated fats and making sure to eat plenty of fruits, vegetables, and whole grains. No one diet is best. Two diets that have been studied and may be beneficial are the  DASH (Dietary Approaches to Stop Hypertension) diet and the Mediterranean diet. The DASH diet emphasizes vegetables, fruits and fat-free or low-fat dairy products; includes whole grains, fish, poultry, beans, seeds, nuts and vegetable oils; and limits sodium, sweets, sugary beverages and red meats. A Mediterranean diet includes relatively little red meat and emphasizes whole grains, fruits and vegetables, fish and shellfish, and nuts, olive oil and other healthy fats.  Social connections and intellectual activity A number of studies indicate that maintaining strong social connections and keeping mentally active as we age might lower the risk of cognitive decline and Alzheimer's. Experts are not certain about the reason for this association. It may be due to direct mechanisms through which social and mental stimulation strengthen connections between nerve cells in the brain.  Head trauma There appears to be a strong link between future risk of Alzheimer's and serious head trauma, especially when injury involves loss of consciousness. You can help reduce your risk of Alzheimer's by protecting your head.  Wear a seat belt  Use a helmet when participating in sports  "Fall-proof" your home   What you can do now While research is not yet conclusive, certain lifestyle choices, such as physical activity and diet, may help support brain health and prevent Alzheimer's. Many of these lifestyle changes have been shown to lower the risk of other diseases, like heart disease and diabetes, which have been linked to Alzheimer's. With few drawbacks and plenty of known benefits, healthy lifestyle choices can improve your health and possibly protect your brain.  Learn more about brain health. You can help increase our knowledge by considering participation in a clinical study. Our free clinical trial matching services, TrialMatch, can help you find clinical trials in your area that are seeking volunteers.

## 2021-02-17 LAB — TSH: TSH: 0.606 u[IU]/mL (ref 0.450–4.500)

## 2021-02-17 LAB — VITAMIN B12: Vitamin B-12: 374 pg/mL (ref 232–1245)

## 2021-02-21 NOTE — Telephone Encounter (Signed)
Signed forms faxed to [facility] at [phone] along with OV notes.

## 2021-02-22 ENCOUNTER — Other Ambulatory Visit: Payer: Self-pay | Admitting: Pulmonary Disease

## 2021-03-02 ENCOUNTER — Telehealth: Payer: Self-pay

## 2021-03-02 ENCOUNTER — Telehealth: Payer: Self-pay | Admitting: Pulmonary Disease

## 2021-03-02 NOTE — Telephone Encounter (Signed)
Received fax from [facility] stating that "patient has declined medication due to high copay. We currently have no open Founations to assist with copay. Patient can apply with the manufacturer PAP BI Cares."  As patient has commercial insurance rather than Medicare, Ofev copay card through [facility] is the most appropriate copay assistance that patient is eligible for. Reached out to [facility] and rep informed me that pt had already made contact and is in process of obtaining copay card (pending pt's signature on required documentation). I asked if there was anything we could do on the provider side in order to help expedite process, however rep stated there was not.  Nothing further needed from [place] at this time, encounter created as FYI for future inquiries.

## 2021-03-02 NOTE — Telephone Encounter (Signed)
Noted. Will await F/U from [facility] or [employer].   Please let us know if there is anything we need to do to assist.

## 2021-03-06 ENCOUNTER — Other Ambulatory Visit: Payer: 59

## 2021-03-10 NOTE — Telephone Encounter (Signed)
Mychart message from Darilyn:  Ruth Brown,  Dr. Isaiah Serge has signed the updated FMLA form and we have faxed it to [facility] at fax# [phone].  I have also put a copy of the form in the mail to you.  Nothing further needed.

## 2021-03-22 ENCOUNTER — Other Ambulatory Visit: Payer: Self-pay

## 2021-03-22 ENCOUNTER — Ambulatory Visit
Admission: RE | Admit: 2021-03-22 | Discharge: 2021-03-22 | Disposition: A | Discharge status: Home / Self Care | Payer: 59 | Source: Ambulatory Visit | Attending: Neurology | Admitting: Neurology

## 2021-03-22 DIAGNOSIS — G3184 Mild cognitive impairment, so stated: Secondary | ICD-10-CM

## 2021-03-29 ENCOUNTER — Telehealth: Payer: Self-pay | Admitting: Primary Care

## 2021-03-29 NOTE — Telephone Encounter (Signed)
Will route to Darilyn to be on the look out for these faxed forms.

## 2021-03-31 NOTE — Telephone Encounter (Signed)
Nothing noted in message. Will close encounter.  

## 2021-04-01 NOTE — Telephone Encounter (Signed)
Called patient to let her know we have not received the new forms to update her disability information (per her phone call on 12/20). She stated she spoke to someone in the [facility] office and that person was supposed to fax it to [place].  Ruth Brown also said she has been unable to upload the form in MyChart because it may be too big.  She will try again, and will also call the [place] office next week to have it faxed again.  I let her know to ask for Marcille Blanco when she calls our office.

## 2021-04-05 NOTE — Telephone Encounter (Signed)
Attn:   Fredric Mare [employer] [place])   Happy Holidays and hope this a great morning Update:  I spoke with Darilyn from [place] on Thursday 03-2021. She instructed me to send fax:  C/O:  Tasia Catchings / [phone]. Ms. Felix Pacini of [employer] [place] will be sending the paperwork via fax.     Thanks for your assistance  Mrs. Dierolf [phone]

## 2021-04-06 NOTE — Telephone Encounter (Signed)
Ruth Brown  P [facility] (supporting Glenford Bayley, NP) 16 hours ago (4:39 PM)   Fredric Mare,    Thanks-- I will let the [employer] Specialist and my manager know.  I appreciate you getting this in for me.   Happy New Year to you and your family and may 2023 bring many memorable moments.   [name]  [phone]

## 2021-04-15 NOTE — Telephone Encounter (Signed)
Form was rec'd from [place] [employer] [place] on 04/05/21 via fax.  I have emailed it to [facility] for completion.

## 2021-04-28 ENCOUNTER — Other Ambulatory Visit: Payer: Self-pay

## 2021-04-28 ENCOUNTER — Ambulatory Visit: Payer: 59 | Admitting: Pulmonary Disease

## 2021-04-28 ENCOUNTER — Encounter: Payer: Self-pay | Admitting: Pulmonary Disease

## 2021-04-28 VITALS — BP 106/56 | HR 100 | Temp 98.0°F | Ht 60.0 in | Wt 108.6 lb

## 2021-04-28 DIAGNOSIS — Z5181 Encounter for therapeutic drug level monitoring: Secondary | ICD-10-CM | POA: Diagnosis not present

## 2021-04-28 DIAGNOSIS — J849 Interstitial pulmonary disease, unspecified: Secondary | ICD-10-CM

## 2021-04-28 NOTE — Patient Instructions (Signed)
We will hold Ofev as its causing diarrhea and weight loss Follow-up in 1 month

## 2021-04-28 NOTE — Progress Notes (Signed)
Ruth Brown    [id number]    12/09/1955  Primary Care Physician:Richter, Maebelle Munroe, MD  Referring Physician: Hayden Rasmussen, MD [address] [place],  [place] [address]  Chief complaint:   RA- ILD. On azathioprine since May 2021 Chronic Cough - Improved with treatment of GERD  HPI:  Ruth Brown is a 66 year old female with Rheumatoid Arthritis (+RF, neg CCP, ANA 1:1280) referred by Ruth Brown after CT on 3/17 showed ILD. Patient first presented to [facility] 6 months ago for dyspnea and cough. MBS on 10/15/2018 showed patient consistent with GERD, cough improved with treatment of GERD. Patient followed by Ruth Brown for possible RA.  Patient's RA being treated with Remicade and Prednisone, and reports improvement in joint pain. Her dyspnea is progressively worsened over the last 6 month.   Started azathioprine 50 mg a day in May 2021 Azathioprine dose increased to 100 mg a day in July 2021 Azathioprine dose increased to 150 mg a day in Sept 2021  Enrolled in IPF pro clinical registry Has been working with GI for weight loss of unclear etiology.  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  Exposures:  Smoking history: Never smoker Travel history: Relevant family history: Father COPD  Interim history: Continues follow-up with [facility].  Continues on Remicade.  Last CBC count showed slight reduction in WBC count to 2.7  Started on Ofev in November 2022.  She has had mild diarrhea with initiation of Ofev but the symptoms increased when she was started on citalopram and aripirzole in the past month  Outpatient Encounter Medications as of 04/28/2021  Medication Sig   albuterol (PROVENTIL) (2.5 MG/3ML) 0.083% nebulizer solution Take 3 mLs (2.5 mg total) by nebulization every 6 (six) hours as needed for wheezing or shortness of breath. (Patient taking differently: Take 2.5 mg by nebulization in  the morning and at bedtime.)   ARIPiprazole (ABILIFY) 2 MG tablet Take 2 mg by mouth daily.   azaTHIOprine (IMURAN) 50 MG tablet TAKE 3 TABLETS BY MOUTH EVERY DAY   budesonide (PULMICORT) 0.5 MG/2ML nebulizer solution TAKE 2 MLS (0.5 MG TOTAL) BY NEBULIZATION 2 (TWO) TIMES DAILY.   busPIRone (BUSPAR) 10 MG tablet Take 10 mg by mouth daily.   citalopram (CELEXA) 40 MG tablet Take 40 mg by mouth daily.   fluticasone (FLONASE) 50 MCG/ACT nasal spray Place 1 spray into both nostrils daily.   hydrochlorothiazide (HYDRODIURIL) 12.5 MG tablet Take 12.5 mg by mouth daily.    inFLIXimab (REMICADE) 100 MG injection Inject 100 mg into the vein every 30 (thirty) days.    Misc. Devices MISC Mask and tubing for SierraNeb 2 - Aero flow   Nintedanib (OFEV) 150 MG CAPS Take 1 capsule (150 mg total) by mouth 2 (two) times daily.   simvastatin (ZOCOR) 20 MG tablet Take 20 mg by mouth daily. Takes 2 tabs (40mg ) a day   donepezil (ARICEPT) 10 MG tablet Take 1 tablet (10 mg total) by mouth at bedtime.   No facility-administered encounter medications on file as of 04/28/2021.   Physical Exam: Blood pressure (!) 106/56, pulse 100, temperature 98 F (36.7 C), temperature source Oral, height 5' (1.524 m), weight 108 lb 9.6 oz (49.3 kg), SpO2 96 %. Gen:      No acute distress HEENT:  EOMI, sclera anicteric Neck:     No masses; no thyromegaly Lungs:  Bibasal crackles CV:         Regular rate and rhythm; no murmurs Abd:      + bowel sounds; soft, non-tender; no palpable masses, no distension Ext:    No edema; adequate peripheral perfusion Skin:      Warm and dry; no rash Neuro: alert and oriented x 3 Psych: normal mood and affect   Data Reviewed: Imaging: CT Chest High Resolution 06/25/2019-considerable respiratory motion.  Worsening peripheral septal thickening with reticulation, bronchiectasis.    CT abdomen 03/19/2020-stable interstitial lung disease at lung bases.  Cholelithiasis, masslike density in the  outer left breast.  High-res CT 01/17/2021-redemonstrated pulmonary fibrosis with worsening compared to 2021.  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 I have reviewed the images personally.  PFTs: 08/11/2019 FVC 1.05(48%) FEV1 .91(53%) F/F 86 (109) TLC 2.06 (46%) DLCO 9.44 (54%)  03/24/2020 FVC 1.26 [59%], FEV1 1.17 [69%], F/F 93, DLCO 8.70 [49%] Severe restriction, moderate/severe diffusion defect  Labs: 10/06/2018 ANA 1:1280, notable reflex labs: RF 15 ( nl <14), CCP neg, Jo 1 -, Sjogren's antibodies negative  CK 08/11/2019-154 Aldolase 08/11/2019-8.8 Myositis panel 08/11/2019-positive PL 12 antibody TPMT 08/11/2019-17  QuantiFERON gold 714/21-negative  Outside labs reviewed on MyChart CBC 04/21/2020-WBC 2.7, hemoglobin 12.4 CMP AST 28, ALT 11  Cardiac: EF 123456, grade 1 diastolic dysfunction, no regional wall motion abnormalities, right atrium moderately dilated, RVP pressure mildly elevated 40.9 mmHg  Right heart catheterization 09/23/2019 Normal right heart pressures. Ao 98%, PA 71%, PA 30/14, mean 21 mm Hg; mean PCWP 9 mm Hg; CO 5.3 L/min; CI 3.3  Assessment: Interstitial lung disease CT consistent with CTD ILD. RF factor low end of normal and CCP negative. ANA 1:1280 convincing of rheumatologic disease.  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 No ' Materials engineer' on exam.  Noted to have elevated CK, aldolase and PL 12 antibody. No pulmonary hypertension on right heart cath in 2021  Continues on Remicade and azathioprine at 150 mg/day  Although her symptoms are unchanged her recent CT scan does show progression with now honeycombing at the base which is concerning for progressive fibrotic phenotype Currently on Ofev but has diarrhea.  I am unable to tell if the diarrhea is from [facility] or the other recent additions to her medications adjusted to a medication such as citalopram and aripirzole.  I reviewed her recent labs from [facility]  office which showed normal hepatic panel.  I have asked her to hold the Ofev to see if it improves her symptoms Return to clinic in 1 month  Plan/Recommendations: Hold Ofev Follow-up in 1 month  Ruth Garfinkel MD [facility] 04/28/2021, 3:44 PM  CC: Ruth Rasmussen, MD

## 2021-05-14 ENCOUNTER — Other Ambulatory Visit: Payer: Self-pay | Admitting: Neurology

## 2021-06-07 ENCOUNTER — Ambulatory Visit (INDEPENDENT_AMBULATORY_CARE_PROVIDER_SITE_OTHER): Payer: 59 | Admitting: Pulmonary Disease

## 2021-06-07 ENCOUNTER — Other Ambulatory Visit: Payer: Self-pay

## 2021-06-07 ENCOUNTER — Encounter: Payer: Self-pay | Admitting: Pulmonary Disease

## 2021-06-07 VITALS — BP 120/64 | HR 81 | Ht 60.0 in | Wt 109.0 lb

## 2021-06-07 DIAGNOSIS — Z5181 Encounter for therapeutic drug level monitoring: Secondary | ICD-10-CM | POA: Diagnosis not present

## 2021-06-07 DIAGNOSIS — J849 Interstitial pulmonary disease, unspecified: Secondary | ICD-10-CM

## 2021-06-07 DIAGNOSIS — R0602 Shortness of breath: Secondary | ICD-10-CM | POA: Diagnosis not present

## 2021-06-07 LAB — PULMONARY FUNCTION TEST
DL/VA % pred: 86 %
DL/VA: 3.73 ml/min/mmHg/L
DLCO cor % pred: 57 %
DLCO cor: 9.93 ml/min/mmHg
DLCO unc % pred: 57 %
DLCO unc: 9.93 ml/min/mmHg
FEF 25-75 Pre: 3.35 L/sec
FEF2575-%Pred-Pre: 203 %
FEV1-%Change-Post: -49 %
FEV1-%Pred-Post: 48 %
FEV1-%Pred-Pre: 96 %
FEV1-Post: 0.8 L
FEV1-Pre: 1.58 L
FEV1FVC-%Change-Post: -22 %
FEV1FVC-%Pred-Pre: 126 %
FEV6-%Change-Post: -34 %
FEV6-%Pred-Post: 51 %
FEV6-%Pred-Pre: 78 %
FEV6-Post: 1.04 L
FEV6-Pre: 1.59 L
FEV6FVC-%Pred-Post: 104 %
FEV6FVC-%Pred-Pre: 104 %
FVC-%Change-Post: -34 %
FVC-%Pred-Post: 49 %
FVC-%Pred-Pre: 75 %
FVC-Post: 1.04 L
FVC-Pre: 1.59 L
Post FEV1/FVC ratio: 77 %
Post FEV6/FVC ratio: 100 %
Pre FEV1/FVC ratio: 99 %
Pre FEV6/FVC Ratio: 100 %
RV % pred: 64 %
RV: 1.2 L
TLC % pred: 59 %
TLC: 2.63 L

## 2021-06-07 LAB — COMPREHENSIVE METABOLIC PANEL
ALT: 6 U/L (ref 0–35)
AST: 15 U/L (ref 0–37)
Albumin: 4 g/dL (ref 3.5–5.2)
Alkaline Phosphatase: 72 U/L (ref 39–117)
BUN: 8 mg/dL (ref 6–23)
CO2: 34 mEq/L — ABNORMAL HIGH (ref 19–32)
Calcium: 10.3 mg/dL (ref 8.4–10.5)
Chloride: 99 mEq/L (ref 96–112)
Creatinine, Ser: 0.5 mg/dL (ref 0.40–1.20)
GFR: 99.35 mL/min (ref >60.00)
Glucose, Bld: 83 mg/dL (ref 70–99)
Potassium: 3.8 mEq/L (ref 3.5–5.1)
Sodium: 138 mEq/L (ref 135–145)
Total Bilirubin: 0.4 mg/dL (ref 0.2–1.2)
Total Protein: 8.6 g/dL — ABNORMAL HIGH (ref 6.0–8.3)

## 2021-06-07 NOTE — Patient Instructions (Signed)
Full PFT performed today. °

## 2021-06-07 NOTE — Progress Notes (Signed)
Ruth Brown    [id number]    [birth year]  Primary Care Physician:Richter, Maebelle Munroe, MD  Referring Physician: Hayden Rasmussen, MD [address] [place],  [place] [address]  Chief complaint:   RA- ILD. On azathioprine since May 2021 Started Ofev November 2022 Chronic Cough - Improved with treatment of GERD  HPI:  Ruth Brown is a 65 year old female with Rheumatoid Arthritis (+RF, neg CCP, ANA 1:1280) referred by Dr.Byrum after CT on 3/17 showed ILD. Patient first presented to Dr.Byrum 6 months ago for dyspnea and cough. MBS on 10/15/2018 showed patient consistent with GERD, cough improved with treatment of GERD. Patient followed by Marella Chimes for possible RA.  Patient's RA being treated with Remicade and Prednisone, and reports improvement in joint pain. Her dyspnea is progressively worsened over the last 6 month.   Started azathioprine 50 mg a day in May 2021 Azathioprine dose increased to 100 mg a day in July 2021 Azathioprine dose increased to 150 mg a day in Sept 2021  Enrolled in IPF pro clinical registry Has been working with GI for weight loss of unclear etiology.  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  Exposures:  Smoking history: Never smoker Travel history: Relevant family history: Father COPD  Interim history: We held Ofev at last visit in January due to diarrhea.  It is now improved and she is feeling well Here for review of PFTs  Outpatient Encounter Medications as of 06/07/2021  Medication Sig   albuterol (PROVENTIL) (2.5 MG/3ML) 0.083% nebulizer solution Take 3 mLs (2.5 mg total) by nebulization every 6 (six) hours as needed for wheezing or shortness of breath. (Patient taking differently: Take 2.5 mg by nebulization in the morning and at bedtime.)   ARIPiprazole (ABILIFY) 2 MG tablet Take 2 mg by mouth daily.   azaTHIOprine (IMURAN) 50 MG tablet TAKE 3 TABLETS BY  MOUTH EVERY DAY   budesonide (PULMICORT) 0.5 MG/2ML nebulizer solution TAKE 2 MLS (0.5 MG TOTAL) BY NEBULIZATION 2 (TWO) TIMES DAILY.   busPIRone (BUSPAR) 10 MG tablet Take 10 mg by mouth daily.   citalopram (CELEXA) 40 MG tablet Take 40 mg by mouth daily.   fluticasone (FLONASE) 50 MCG/ACT nasal spray Place 1 spray into both nostrils daily.   hydrochlorothiazide (HYDRODIURIL) 12.5 MG tablet Take 12.5 mg by mouth daily.    inFLIXimab (REMICADE) 100 MG injection Inject 100 mg into the vein every 30 (thirty) days.    Misc. Devices MISC Mask and tubing for SierraNeb 2 - Aero flow   simvastatin (ZOCOR) 20 MG tablet Take 20 mg by mouth daily. Takes 2 tabs (40mg ) a day   donepezil (ARICEPT) 10 MG tablet Take 1 tablet (10 mg total) by mouth at bedtime.   [DISCONTINUED] Nintedanib (OFEV) 150 MG CAPS Take 1 capsule (150 mg total) by mouth 2 (two) times daily.   No facility-administered encounter medications on file as of 06/07/2021.   Physical Exam: Blood pressure 120/64, pulse 81, height 5' (1.524 m), weight 109 lb (49.4 kg), SpO2 93 %. Gen:      No acute distress HEENT:  EOMI, sclera anicteric Neck:     No masses; no thyromegaly Lungs:    Clear to auscultation bilaterally; normal respiratory effort CV:         Regular rate and rhythm; no murmurs Abd:      + bowel sounds;  soft, non-tender; no palpable masses, no distension Ext:    No edema; adequate peripheral perfusion Skin:      Warm and dry; no rash Neuro: alert and oriented x 3 Psych: normal mood and affect   Data Reviewed: Imaging: CT Chest High Resolution 06/25/2019-considerable respiratory motion.  Worsening peripheral septal thickening with reticulation, bronchiectasis.    CT abdomen 03/19/2020-stable interstitial lung disease at lung bases.  Cholelithiasis, masslike density in the outer left breast.  High-res CT 01/17/2021-redemonstrated pulmonary fibrosis with worsening compared to 2021.  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 I have  reviewed the images personally.  PFTs: 08/11/2019 FVC 1.05(48%) FEV1 .91(53%) F/F 86 (109) TLC 2.06 (46%) DLCO 9.44 (54%)  03/24/2020 FVC 1.26 [59%], FEV1 1.17 [69%], F/F 93, DLCO 8.70 [49%] Severe restriction, moderate/severe diffusion defect  06/07/2021 FVC 1.49 [95%], FEV1 1.58 [96%], F/F 99, TLC 2.63 [59%], DLCO 9.93 [57%] Severe restriction, moderate diffusion defect  Labs: 10/06/2018 ANA 1:1280, notable reflex labs: RF 15 ( nl <14), CCP neg, Jo 1 -, Sjogren's antibodies negative  CK 08/11/2019-154 Aldolase 08/11/2019-8.8 Myositis panel 08/11/2019-positive PL 12 antibody TPMT 08/11/2019-17  QuantiFERON gold 714/21-negative  Outside labs reviewed on MyChart CBC 04/21/2020-WBC 2.7, hemoglobin 12.4 CMP AST 28, ALT 11  Cardiac: EF 123456, grade 1 diastolic dysfunction, no regional wall motion abnormalities, right atrium moderately dilated, RVP pressure mildly elevated 40.9 mmHg  Right heart catheterization 09/23/2019 Normal right heart pressures. Ao 98%, PA 71%, PA 30/14, mean 21 mm Hg; mean PCWP 9 mm Hg; CO 5.3 L/min; CI 3.3  Assessment: Interstitial lung disease CT consistent with CTD ILD. RF factor low end of normal and CCP negative. ANA 1:1280 convincing of rheumatologic disease.  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 No ' Materials engineer' on exam.  Noted to have elevated CK, aldolase and PL 12 antibody. No pulmonary hypertension on right heart cath in 2021  Continues on Remicade and azathioprine at 150 mg/day  Although her symptoms are unchanged her recent CT scan does show progression with now honeycombing at the base which is concerning for progressive fibrotic phenotype Ofev held for 1 month due to diarrhea.  Will resume at 100 mg twice daily dose and monitor closely Recheck hepatic panel and proBNP today  Plan/Recommendations: Resume Ofev 100 mg twice daily, check hepatic panel, proBNP  Marshell Garfinkel MD [facility] 06/07/2021, 12:11 PM  CC: Hayden Rasmussen, MD

## 2021-06-07 NOTE — Progress Notes (Signed)
Full PFT performed today. °

## 2021-06-07 NOTE — Patient Instructions (Signed)
We will resume the Ofev at 100 mg twice daily.  I will send a message to pharmacy We will check CMP and N-terminal proBNP Return to clinic in 3 months

## 2021-06-08 LAB — PRO B NATRIURETIC PEPTIDE: NT-Pro BNP: 55 pg/mL (ref 0–287)

## 2021-06-17 ENCOUNTER — Other Ambulatory Visit: Payer: Self-pay | Admitting: Neurology

## 2021-06-20 ENCOUNTER — Telehealth: Payer: Self-pay | Admitting: Pulmonary Disease

## 2021-06-21 MED ORDER — OFEV 100 MG PO CAPS: 100.0000 mg | ORAL_CAPSULE | Freq: Two times a day (BID) | ORAL | 5 refills | Status: DC

## 2021-06-21 NOTE — Telephone Encounter (Signed)
Ofev sent to preferred pharmacy. ? Patient is aware and voiced his understanding.  ?Nothing further needed.  ?

## 2021-06-25 ENCOUNTER — Telehealth: Payer: Self-pay | Admitting: Pulmonary Disease

## 2021-06-25 DIAGNOSIS — Z5181 Encounter for therapeutic drug level monitoring: Secondary | ICD-10-CM

## 2021-06-25 NOTE — Telephone Encounter (Signed)
She is due for follow-up CBC and CMP.  Her last WBC count in January was low and we need to monitor ?Please order labs ?

## 2021-06-28 NOTE — Telephone Encounter (Signed)
ATC patient to let her know about lab orders.  LM for patient to call back.  Labs are ordered for patient.  Will follow up. ?

## 2021-06-28 NOTE — Telephone Encounter (Signed)
ATC patient.  LM for patient to call office after 8am 06/29/21. ?

## 2021-06-29 NOTE — Telephone Encounter (Signed)
Patient is returning phone call. Patient phone number is [phone]. ?

## 2021-06-29 NOTE — Telephone Encounter (Signed)
Called and spoke with patient. She is aware of the lab orders and our lab hours. She stated that she will stop on Friday since she is working today and tomorrow.  ? ?While on the phone, she stated that she had not received her Ofev yet from [facility]. She received a call from them on Monday but they told her the medication had not come in yet. I advised her that [facility] would [facility]. She verbalized understanding.  ? ?Called and spoke with Verdis Frederickson at [facility]. She confirmed that they received the Ofev prescription. She reviewed her chart and stayed that there is not a scheduled shipment date for her. In order for the medication to be shipped, she will have call [phone] to request the refill.  ? ?Called and spoke with patient again. She is aware of the above information. [facility].  ? ?Nothing further needed at time of call.  ?

## 2021-06-30 ENCOUNTER — Other Ambulatory Visit (INDEPENDENT_AMBULATORY_CARE_PROVIDER_SITE_OTHER): Payer: 59

## 2021-06-30 DIAGNOSIS — Z5181 Encounter for therapeutic drug level monitoring: Secondary | ICD-10-CM | POA: Diagnosis not present

## 2021-06-30 LAB — CBC WITH DIFFERENTIAL/PLATELET
Basophils Absolute: 0 10*3/uL (ref 0.0–0.1)
Basophils Relative: 0.4 % (ref 0.0–3.0)
Eosinophils Absolute: 0.1 10*3/uL (ref 0.0–0.7)
Eosinophils Relative: 2.5 % (ref 0.0–5.0)
HCT: 32.3 % — ABNORMAL LOW (ref 36.0–46.0)
Hemoglobin: 10.8 g/dL — ABNORMAL LOW (ref 12.0–15.0)
Lymphocytes Relative: 36.9 % (ref 12.0–46.0)
Lymphs Abs: 1 10*3/uL (ref 0.7–4.0)
MCHC: 33.3 g/dL (ref 30.0–36.0)
MCV: 101 fl — ABNORMAL HIGH (ref 78.0–100.0)
Monocytes Absolute: 0.3 10*3/uL (ref 0.1–1.0)
Monocytes Relative: 10.2 % (ref 3.0–12.0)
Neutro Abs: 1.4 10*3/uL (ref 1.4–7.7)
Neutrophils Relative %: 50 % (ref 43.0–77.0)
Platelets: 279 10*3/uL (ref 150.0–400.0)
RBC: 3.2 Mil/uL — ABNORMAL LOW (ref 3.87–5.11)
RDW: 17.6 % — ABNORMAL HIGH (ref 11.5–15.5)
WBC: 2.8 10*3/uL — ABNORMAL LOW (ref 4.0–10.5)

## 2021-06-30 LAB — COMPREHENSIVE METABOLIC PANEL
ALT: 6 U/L (ref 0–35)
AST: 15 U/L (ref 0–37)
Albumin: 4.1 g/dL (ref 3.5–5.2)
Alkaline Phosphatase: 71 U/L (ref 39–117)
BUN: 9 mg/dL (ref 6–23)
CO2: 35 mEq/L — ABNORMAL HIGH (ref 19–32)
Calcium: 9.5 mg/dL (ref 8.4–10.5)
Chloride: 101 mEq/L (ref 96–112)
Creatinine, Ser: 0.52 mg/dL (ref 0.40–1.20)
GFR: 98.37 mL/min (ref >60.00)
Glucose, Bld: 89 mg/dL (ref 70–99)
Potassium: 3.6 mEq/L (ref 3.5–5.1)
Sodium: 140 mEq/L (ref 135–145)
Total Bilirubin: 0.4 mg/dL (ref 0.2–1.2)
Total Protein: 7.9 g/dL (ref 6.0–8.3)

## 2021-06-30 LAB — HEPATIC FUNCTION PANEL
ALT: 6 U/L (ref 0–35)
AST: 15 U/L (ref 0–37)
Albumin: 4.1 g/dL (ref 3.5–5.2)
Alkaline Phosphatase: 71 U/L (ref 39–117)
Bilirubin, Direct: 0.1 mg/dL (ref 0.0–0.3)
Total Bilirubin: 0.4 mg/dL (ref 0.2–1.2)
Total Protein: 7.9 g/dL (ref 6.0–8.3)

## 2021-07-09 ENCOUNTER — Other Ambulatory Visit: Payer: Self-pay | Admitting: Pulmonary Disease

## 2021-07-09 DIAGNOSIS — J310 Chronic rhinitis: Secondary | ICD-10-CM

## 2021-07-09 DIAGNOSIS — J849 Interstitial pulmonary disease, unspecified: Secondary | ICD-10-CM

## 2021-07-13 ENCOUNTER — Telehealth: Payer: Self-pay | Admitting: Pulmonary Disease

## 2021-07-13 ENCOUNTER — Other Ambulatory Visit: Payer: Self-pay | Admitting: *Deleted

## 2021-07-13 DIAGNOSIS — Z5181 Encounter for therapeutic drug level monitoring: Secondary | ICD-10-CM

## 2021-07-13 NOTE — Telephone Encounter (Signed)
See result note.  

## 2021-07-15 ENCOUNTER — Encounter: Payer: 59 | Admitting: *Deleted

## 2021-07-15 DIAGNOSIS — J849 Interstitial pulmonary disease, unspecified: Secondary | ICD-10-CM

## 2021-07-15 DIAGNOSIS — Z006 Encounter for examination for normal comparison and control in clinical research program: Secondary | ICD-10-CM

## 2021-07-18 NOTE — Research (Signed)
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  ?Protocol #: IPF-PRO-SUB, Clinical Trials # [id number], Sponsor: [employer] ?  ?Protocol Version Amendment 4 dated 12Sep2019  and confirmed current on  ?Consent Version for today?s visit date of  [facility] Approved Version 14 Mar 2018 Revised 14 Mar 2018 ?  ?Objectives:  ?Describe current approaches to diagnosis and treatment of chronic fibrosing ILDs with progressive phenotype  ?Describe the natural history of chronic fibrosing ILDs with progressive phenotype  ?Assess quality of life from self-administered participant reported questionnaires for each disease group  ?Describe participant interactions with the healthcare system, describe treatment practices across multiple institutions for each disease group  ?Collect biological samples linked to well characterized chronic fibrosing ILDs with progressive phenotype to identify disease biomarkers  ?Collect data and biological samples that will support future research studies.  ?                                          ?Key Inclusion Criteria: ?Willing and able to provide informed consent  ?Age ? 30 years  ?Diagnosis of a non-IPF ILD of any duration, including, but not limited to Idiopathic Non-Specific Interstitial Pneumonia (INSIP), Unclassifiable Idiopathic Interstitial Pneumonias (IIPs), Interstitial Pneumonia with Autoimmune Features (IPAF), Autoimmune ILDs such as Rheumatoid Arthritis (RA-ILD) and Systemic Sclerosis (SSC-ILD), Chronic Hypersensitivity Pneumonitis (HP), Sarcoidosis or Exposure-related ILDs such as asbestosis.  ?Chronic fibrosing ILD defined by reticular abnormality with traction bronchiectasis with or without honeycombing confirmed by chest HRCT scan and/or lung biopsy.  ?Progressive phenotype as defined by fulfilling at least one of the criteria below of fibrotic changes (progression set point)  within the last 24 months regardless of treatment considered appropriate in individual ILDs:  ?decline in FVC % predicted (% pred) based on >10% relative decline  ?decline in FVC % pred based on ? 5 - <10% relative decline in FVC combined with worsening of respiratory symptoms as assessed by the site investigator  ?decline in FVC % pred based on ? 5 - <10% relative decline in FVC combined with increasing extent of fibrotic changes on chest imaging (HRCT scan) as assessed by the site investigator  ?decline in DLCO % pred based on ? 10% relative decline  ?worsening of respiratory symptoms as well as increasing extent of fibrotic changes on chest imaging (HRCT scan) as assessed by the site investigator independent of FVC change.   ?  ?Key Exclusion Criteria: ?Malignancy, treated or untreated, other than skin or early stage prostate cancer, within the past 5 years  ?Currently listed for lung transplantation at the time of enrollment  ?Currently enrolled in a clinical trial at the time of enrollment in this registry   ?  ?  ?Clinical Research Coordinator / Research RN note : This visit for Ruth Brown Subject [id number] with DOB: [birth year] on 07/15/2021 for the above protocol is Visit/Encounter # 3 and is for purpose of research.  ?  ?Subject expressed continued interest and consent in continuing as a study subject. Subject confirmed that there was no change in contact information (e.g. address, telephone, email). Subject thanked for participation in research and contribution to science.   ?  ? During this visit on Ruth Brown , the subject completed the blood work and questionnaires per the above referenced protocol. Please refer to the subject's paper source binder for further details. ?  ?  Signed by ?Jola Babinski Malessa Zartman ?Research Assistant ?[facility]  ?[place], [place] ?10:44 AM 07/18/2021 ?  ?

## 2021-07-20 ENCOUNTER — Encounter: Payer: Self-pay | Admitting: *Deleted

## 2021-09-02 ENCOUNTER — Other Ambulatory Visit: Payer: Self-pay | Admitting: Neurology

## 2021-09-06 MED ORDER — DONEPEZIL HCL 10 MG PO TABS: ORAL_TABLET | ORAL | 0 refills | Status: DC

## 2021-09-06 NOTE — Telephone Encounter (Signed)
Rx refilled.

## 2021-09-15 ENCOUNTER — Other Ambulatory Visit: Payer: Self-pay | Admitting: Pulmonary Disease

## 2021-09-15 DIAGNOSIS — J849 Interstitial pulmonary disease, unspecified: Secondary | ICD-10-CM

## 2021-09-20 ENCOUNTER — Telehealth: Payer: Self-pay | Admitting: Pharmacist

## 2021-09-20 NOTE — Telephone Encounter (Signed)
Received fax from [facility] stating that Ofev rx has been placed on hold as patient stated that she is not on therapy anymore as per MD decision. Per chart review, Dickey Gave was held in the past but patient restarted with office visit in February 2023. Last fill was March 2023 for 30 days per dispense report.  Left VM for patient requesting return call for clarification if she had diarrhea with re-trial  Knox Saliva, PharmD, MPH, BCPS, CPP Clinical Pharmacist (Rheumatology and Pulmonology)

## 2021-09-21 ENCOUNTER — Telehealth: Payer: Self-pay | Admitting: Pulmonary Disease

## 2021-09-22 NOTE — Telephone Encounter (Signed)
Patient called and left message with front. Returned call to patient - phone went straight to VM. Left detailed VM and asked her to return call to my direct phone number and requested she leave a detailed VM for me for status on if she is taking Ofev or not  Karell Tukes, PharmD, MPH, BCPS, CPP Clinical Pharmacist (Rheumatology and Pulmonology) 

## 2021-09-22 NOTE — Telephone Encounter (Signed)
Patient called returning call to [facility].  Message routed message to pharmacy team

## 2021-09-22 NOTE — Telephone Encounter (Signed)
Patient called and left message with front. Returned call to patient - phone went straight to VM. Left detailed VM and asked her to return call to my direct phone number and requested she leave a detailed VM for me for status on if she is taking Ofev or not  Chesley Mires, PharmD, MPH, BCPS, CPP Clinical Pharmacist (Rheumatology and Pulmonology)

## 2021-09-23 ENCOUNTER — Other Ambulatory Visit (INDEPENDENT_AMBULATORY_CARE_PROVIDER_SITE_OTHER): Payer: Self-pay

## 2021-09-23 DIAGNOSIS — Z5181 Encounter for therapeutic drug level monitoring: Secondary | ICD-10-CM

## 2021-09-23 LAB — CBC WITH DIFFERENTIAL/PLATELET
Basophils Absolute: 0 10*3/uL (ref 0.0–0.1)
Basophils Relative: 0.6 % (ref 0.0–3.0)
Eosinophils Absolute: 0.1 10*3/uL (ref 0.0–0.7)
Eosinophils Relative: 0.9 % (ref 0.0–5.0)
HCT: 33.9 % — ABNORMAL LOW (ref 36.0–46.0)
Hemoglobin: 11.3 g/dL — ABNORMAL LOW (ref 12.0–15.0)
Lymphocytes Relative: 14.8 % (ref 12.0–46.0)
Lymphs Abs: 1 10*3/uL (ref 0.7–4.0)
MCHC: 33.2 g/dL (ref 30.0–36.0)
MCV: 94.5 fl (ref 78.0–100.0)
Monocytes Absolute: 0.3 10*3/uL (ref 0.1–1.0)
Monocytes Relative: 5.1 % (ref 3.0–12.0)
Neutro Abs: 5.2 10*3/uL (ref 1.4–7.7)
Neutrophils Relative %: 78.6 % — ABNORMAL HIGH (ref 43.0–77.0)
Platelets: 352 10*3/uL (ref 150.0–400.0)
RBC: 3.59 Mil/uL — ABNORMAL LOW (ref 3.87–5.11)
RDW: 14.1 % (ref 11.5–15.5)
WBC: 6.6 10*3/uL (ref 4.0–10.5)

## 2021-09-26 NOTE — Telephone Encounter (Signed)
Patient returned call and left detailed VM. She states that she stopped Ofev back in late March/April per direction of Dr. Isaiah Serge. I do not see any specific notation in chart indicating this information based on OV, lab results, or telephone calls.  Chesley Mires, PharmD, MPH, BCPS, CPP Clinical Pharmacist (Rheumatology and Pulmonology)

## 2021-10-25 ENCOUNTER — Encounter: Payer: Self-pay | Admitting: Internal Medicine

## 2021-10-25 DIAGNOSIS — J849 Interstitial pulmonary disease, unspecified: Secondary | ICD-10-CM

## 2021-10-25 DIAGNOSIS — Z006 Encounter for examination for normal comparison and control in clinical research program: Secondary | ICD-10-CM

## 2021-10-26 NOTE — Research (Signed)
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    Protocol #: IPF-PRO-SUB, Clinical Trials # [id number], Sponsor: [employer]   Protocol Version Amendment 4 dated 12Sep2019  and confirmed current on  Consent Version for today's visit date of  [facility] Approved Version 14 Mar 2018 Revised 14 Mar 2018   Objectives:  Describe current approaches to diagnosis and treatment of chronic fibrosing ILDs with progressive phenotype  Describe the natural history of chronic fibrosing ILDs with progressive phenotype  Assess quality of life from self-administered participant reported questionnaires for each disease group  Describe participant interactions with the healthcare system, describe treatment practices across multiple institutions for each disease group  Collect biological samples linked to well characterized chronic fibrosing ILDs with progressive phenotype to identify disease biomarkers  Collect data and biological samples that will support future research studies.                                            Key Inclusion Criteria: Willing and able to provide informed consent  Age ? 30 years  Diagnosis of a non-IPF ILD of any duration, including, but not limited to Idiopathic Non-Specific Interstitial Pneumonia (INSIP), Unclassifiable Idiopathic Interstitial Pneumonias (IIPs), Interstitial Pneumonia with Autoimmune Features (IPAF), Autoimmune ILDs such as Rheumatoid Arthritis (RA-ILD) and Systemic Sclerosis (SSC-ILD), Chronic Hypersensitivity Pneumonitis (HP), Sarcoidosis or Exposure-related ILDs such as asbestosis.  Chronic fibrosing ILD defined by reticular abnormality with traction bronchiectasis with or without honeycombing confirmed by chest HRCT scan and/or lung biopsy.  Progressive phenotype as defined by fulfilling at least one of the criteria below of fibrotic changes (progression set point)  within the last 24 months regardless of treatment considered appropriate in individual ILDs:  decline in FVC % predicted (% pred) based on >10% relative decline  decline in FVC % pred based on ? 5 - <10% relative decline in FVC combined with worsening of respiratory symptoms as assessed by the site investigator  decline in FVC % pred based on ? 5 - <10% relative decline in FVC combined with increasing extent of fibrotic changes on chest imaging (HRCT scan) as assessed by the site investigator  decline in DLCO % pred based on ? 10% relative decline  worsening of respiratory symptoms as well as increasing extent of fibrotic changes on chest imaging (HRCT scan) as assessed by the site investigator independent of FVC change.     Key Exclusion Criteria: Malignancy, treated or untreated, other than skin or early stage prostate cancer, within the past 5 years  Currently listed for lung transplantation at the time of enrollment  Currently enrolled in a clinical trial at the time of enrollment in this registry       Clinical Research Coordinator / Research RN note : This visit for Ruth Brown. [name] with DOB: 08-17-1955 on 18Jul2023 for the above protocol is Visit/Encounter 4 and is for purpose of research.    Subject expressed continued interest and consent in continuing as a study subject. Subject confirmed that there was no change in contact information (e.g. address, telephone, email). Subject thanked for participation in research and contribution to science.     During this visit on 18Jul2023 , the subject completed the blood work and questionnaires per the above referenced protocol. Please refer to the subject's paper source binder for further details.  Signed by Loma Newton Research Assistant [facility]  [place], [place] 41:63 18Jul2023

## 2021-12-01 ENCOUNTER — Ambulatory Visit: Payer: Self-pay | Admitting: Pulmonary Disease

## 2022-05-02 ENCOUNTER — Encounter: Payer: Medicare Other | Admitting: Internal Medicine

## 2022-05-02 DIAGNOSIS — J849 Interstitial pulmonary disease, unspecified: Secondary | ICD-10-CM

## 2022-05-02 DIAGNOSIS — Z006 Encounter for examination for normal comparison and control in clinical research program: Secondary | ICD-10-CM

## 2022-05-02 NOTE — Research (Signed)
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    Protocol #: IPF-PRO-SUB, Clinical Trials # [id number], Sponsor: [employer]   Protocol Version Amendment 4 dated 12Sep2019  and confirmed current on  Consent Version for today's visit date of  Is [place] IRB Approved Version 14 Mar 2018 Revised 14 Mar 2018   Objectives:  Describe current approaches to diagnosis and treatment of chronic fibrosing ILDs with progressive phenotype  Describe the natural history of chronic fibrosing ILDs with progressive phenotype  Assess quality of life from self-administered participant reported questionnaires for each disease group  Describe participant interactions with the healthcare system, describe treatment practices across multiple institutions for each disease group  Collect biological samples linked to well characterized chronic fibrosing ILDs with progressive phenotype to identify disease biomarkers  Collect data and biological samples that will support future research studies.                                            Key Inclusion Criteria: Willing and able to provide informed consent  Age ? 30 years  Diagnosis of a non-IPF ILD of any duration, including, but not limited to Idiopathic Non-Specific Interstitial Pneumonia (INSIP), Unclassifiable Idiopathic Interstitial Pneumonias (IIPs), Interstitial Pneumonia with Autoimmune Features (IPAF), Autoimmune ILDs such as Rheumatoid Arthritis (RA-ILD) and Systemic Sclerosis (SSC-ILD), Chronic Hypersensitivity Pneumonitis (HP), Sarcoidosis or Exposure-related ILDs such as asbestosis.  Chronic fibrosing ILD defined by reticular abnormality with traction bronchiectasis with or without honeycombing confirmed by chest HRCT scan and/or lung biopsy.  Progressive phenotype as defined by fulfilling at least one of the criteria below of fibrotic changes (progression set point)  within the last 24 months regardless of treatment considered appropriate in individual ILDs:  decline in FVC % predicted (% pred) based on >10% relative decline  decline in FVC % pred based on ? 5 - <10% relative decline in FVC combined with worsening of respiratory symptoms as assessed by the site investigator  decline in FVC % pred based on ? 5 - <10% relative decline in FVC combined with increasing extent of fibrotic changes on chest imaging (HRCT scan) as assessed by the site investigator  decline in DLCO % pred based on ? 10% relative decline  worsening of respiratory symptoms as well as increasing extent of fibrotic changes on chest imaging (HRCT scan) as assessed by the site investigator independent of FVC change.     Key Exclusion Criteria: Malignancy, treated or untreated, other than skin or early stage prostate cancer, within the past 5 years  Currently listed for lung transplantation at the time of enrollment  Currently enrolled in a clinical trial at the time of enrollment in this registry       Clinical Research Coordinator / Research RN note : This visit for Ruth Brown  Subject [phone] with DOB:1955-07-01  on 23/Jan/2024  for the above protocol is Visit/Encounter 5 and is for purpose of research.    Subject expressed continued interest and consent in continuing as a study subject. Subject confirmed that there was no change in contact information (e.g. address, telephone, email). Subject thanked for participation in research and contribution to science.     During this visit on 23/Jan/2024  , the subject completed the blood work and questionnaires per the above referenced protocol. Please refer to the subject's paper source binder for further  details.   Signed by [place] Coordinator [facility]  [place], [place] 23/Jan/2024  11:23am

## 2022-06-16 DIAGNOSIS — R922 Inconclusive mammogram: Secondary | ICD-10-CM | POA: Diagnosis not present

## 2022-06-16 DIAGNOSIS — R921 Mammographic calcification found on diagnostic imaging of breast: Secondary | ICD-10-CM | POA: Diagnosis not present

## 2022-06-19 DIAGNOSIS — F329 Major depressive disorder, single episode, unspecified: Secondary | ICD-10-CM | POA: Diagnosis not present

## 2022-06-19 DIAGNOSIS — E785 Hyperlipidemia, unspecified: Secondary | ICD-10-CM | POA: Diagnosis not present

## 2022-06-19 DIAGNOSIS — Z79899 Other long term (current) drug therapy: Secondary | ICD-10-CM | POA: Diagnosis not present

## 2022-06-19 DIAGNOSIS — I10 Essential (primary) hypertension: Secondary | ICD-10-CM | POA: Diagnosis not present

## 2022-06-19 DIAGNOSIS — Z1159 Encounter for screening for other viral diseases: Secondary | ICD-10-CM | POA: Diagnosis not present

## 2022-06-19 DIAGNOSIS — D8481 Immunodeficiency due to conditions classified elsewhere: Secondary | ICD-10-CM | POA: Diagnosis not present

## 2022-06-19 DIAGNOSIS — J849 Interstitial pulmonary disease, unspecified: Secondary | ICD-10-CM | POA: Diagnosis not present

## 2022-06-19 DIAGNOSIS — M069 Rheumatoid arthritis, unspecified: Secondary | ICD-10-CM | POA: Diagnosis not present

## 2022-06-19 DIAGNOSIS — Z0000 Encounter for general adult medical examination without abnormal findings: Secondary | ICD-10-CM | POA: Diagnosis not present

## 2022-07-21 ENCOUNTER — Other Ambulatory Visit: Payer: Self-pay | Admitting: Student

## 2022-07-21 DIAGNOSIS — E2839 Other primary ovarian failure: Secondary | ICD-10-CM

## 2023-11-13 NOTE — Progress Notes (Signed)
 This encounter was created in error - please disregard.

## 2024-02-20 ENCOUNTER — Ambulatory Visit: Admitting: Pulmonary Disease

## 2024-02-20 ENCOUNTER — Ambulatory Visit: Payer: Self-pay | Admitting: Pulmonary Disease

## 2024-02-20 NOTE — Telephone Encounter (Signed)
 Noted. Nothing further needed.

## 2024-02-20 NOTE — Telephone Encounter (Signed)
 FYI Only or Action Required?: FYI only for provider: appointment scheduled on 02/21/2024.  Patient is followed in Pulmonology for ILD, last seen on 05/02/2022 by Geronimo Amel, MD.  Called Nurse Triage reporting Shortness of Breath.  Symptoms began several days ago.  Interventions attempted: Increased fluids/rest.  Symptoms are: unchanged.  Triage Disposition: See HCP Within 4 Hours (Or PCP Triage)  Patient/caregiver understands and will follow disposition?: Yes  Copied from CRM [id number]. Topic: Clinical - Red Word Triage >> Feb 20, 2024  2:03 PM Leila BROCKS wrote: Red Word that prompted transfer to Nurse Triage: Patient [phone] calling to see if the appointment with Dr. Theophilus today. Informed patient, the appointment was for 02/20/24 at 11:30 am with Dr. Theophilus. Patient sorry for missing the appointment was taking care of sick husband. Informed patient, lov 04/28/21 with Dr. Theophilus. Patient states is having a lot of shortness of breath, wheezing, throat hurting since last week, and dizziness. Patient denies fever nor pain. Please advise.     ----------------------------------------------------------------------- From previous Reason for Contact - Scheduling: Patient/patient representative is calling to schedule an appointment. Refer to attachments for appointment information. Reason for Disposition  [1] MILD difficulty breathing (e.g., minimal/no SOB at rest, SOB with walking, pulse < 100) AND [2] NEW-onset or WORSE than normal  Answer Assessment - Initial Assessment Questions 1. RESPIRATORY STATUS: Describe your breathing? (e.g., wheezing, shortness of breath, unable to speak, severe coughing)      Shortness of breath 2. ONSET: When did this breathing problem begin?      Two days ago 3. PATTERN Does the difficult breathing come and go, or has it been constant since it started?      Comes and goes with increased activity, like climbing stairs or when singing in the  choir 4. SEVERITY: How bad is your breathing? (e.g., mild, moderate, severe)      mild 5. RECURRENT SYMPTOM: Have you had difficulty breathing before? If Yes, ask: When was the last time? and What happened that time?      About 5 years ago 6. CARDIAC HISTORY: Do you have any history of heart disease? (e.g., heart attack, angina, bypass surgery, angioplasty)      HTN 7. LUNG HISTORY: Do you have any history of lung disease?  (e.g., pulmonary embolus, asthma, emphysema)     ILD 8. CAUSE: What do you think is causing the breathing problem?      Activity, ILD 9. OTHER SYMPTOMS: Do you have any other symptoms? (e.g., chest pain, cough, dizziness, fever, runny nose)     dizziness 10. O2 SATURATION MONITOR:  Do you use an oxygen saturation monitor (pulse oximeter) at home? If Yes, ask: What is your reading (oxygen level) today? What is your usual oxygen saturation reading? (e.g., 95%)       Not monitored  Protocols used: Breathing Difficulty-A-AH

## 2024-02-21 ENCOUNTER — Ambulatory Visit: Admitting: Pulmonary Disease

## 2024-02-21 ENCOUNTER — Telehealth: Payer: Self-pay | Admitting: Pulmonary Disease

## 2024-02-21 ENCOUNTER — Encounter: Payer: Self-pay | Admitting: Pulmonary Disease

## 2024-02-21 VITALS — BP 110/73 | HR 88 | Temp 98.0°F | Ht 64.0 in | Wt 134.0 lb

## 2024-02-21 DIAGNOSIS — M069 Rheumatoid arthritis, unspecified: Secondary | ICD-10-CM | POA: Diagnosis not present

## 2024-02-21 DIAGNOSIS — J849 Interstitial pulmonary disease, unspecified: Secondary | ICD-10-CM

## 2024-02-21 LAB — CBC WITH DIFFERENTIAL/PLATELET
Basophils Absolute: 0 K/uL (ref 0.0–0.1)
Basophils Relative: 0.5 % (ref 0.0–3.0)
Eosinophils Absolute: 0.1 K/uL (ref 0.0–0.7)
Eosinophils Relative: 1.9 % (ref 0.0–5.0)
HCT: 35.7 % — ABNORMAL LOW (ref 36.0–46.0)
Hemoglobin: 11.9 g/dL — ABNORMAL LOW (ref 12.0–15.0)
Lymphocytes Relative: 25.9 % (ref 12.0–46.0)
Lymphs Abs: 1.3 K/uL (ref 0.7–4.0)
MCHC: 33.4 g/dL (ref 30.0–36.0)
MCV: 86.7 fl (ref 78.0–100.0)
Monocytes Absolute: 0.4 K/uL (ref 0.1–1.0)
Monocytes Relative: 8.1 % (ref 3.0–12.0)
Neutro Abs: 3.3 K/uL (ref 1.4–7.7)
Neutrophils Relative %: 63.6 % (ref 43.0–77.0)
Platelets: 286 K/uL (ref 150.0–400.0)
RBC: 4.11 Mil/uL (ref 3.87–5.11)
RDW: 14.8 % (ref 11.5–15.5)
WBC: 5.2 K/uL (ref 4.0–10.5)

## 2024-02-21 LAB — COMPREHENSIVE METABOLIC PANEL WITH GFR
ALT: 7 U/L (ref 0–35)
AST: 17 U/L (ref 0–37)
Albumin: 4.2 g/dL (ref 3.5–5.2)
Alkaline Phosphatase: 85 U/L (ref 39–117)
BUN: 9 mg/dL (ref 6–23)
CO2: 33 meq/L — ABNORMAL HIGH (ref 19–32)
Calcium: 9.7 mg/dL (ref 8.4–10.5)
Chloride: 100 meq/L (ref 96–112)
Creatinine, Ser: 0.62 mg/dL (ref 0.40–1.20)
GFR: 92.56 mL/min (ref >60.00)
Glucose, Bld: 74 mg/dL (ref 70–99)
Potassium: 3.9 meq/L (ref 3.5–5.1)
Sodium: 138 meq/L (ref 135–145)
Total Bilirubin: 0.3 mg/dL (ref 0.2–1.2)
Total Protein: 8.8 g/dL — ABNORMAL HIGH (ref 6.0–8.3)

## 2024-02-21 NOTE — Telephone Encounter (Signed)
 Pt left a Patient Assistance Application Form for Dr. Theophilus to complete. Placed in [employer].

## 2024-02-21 NOTE — Patient Instructions (Signed)
  VISIT SUMMARY: During your visit, we discussed your worsening shortness of breath and its connection to your interstitial lung disease and rheumatoid arthritis. We have planned several tests and treatments to better manage your conditions.  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 We need to check the current state of your lungs and restart your treatment. -We have ordered a CT scan of your chest to see the current condition of your lungs. -We have also ordered pulmonary function tests (PFTs) to measure how well your lungs are working. -You will restart nintedanib (Ofev ) at a higher dose. -We have checked your liver function and blood counts. -Based on the results of your CT scan and PFTs, we may add a medication called CellCept.  RHEUMATOID ARTHRITIS: Your rheumatoid arthritis is causing joint stiffness and pain, especially in your knees. You haven't been able to see a rheumatologist due to financial issues. -We have referred you to a rheumatologist at [facility] for further management. -We will discuss potential anti-inflammatory treatments for your joints with the rheumatologist.  SHORTNESS OF BREATH: You have recently experienced worsening shortness of breath, likely related to your lung disease. -We will address your shortness of breath by restarting your lung disease medications and conducting further evaluations.

## 2024-02-21 NOTE — Progress Notes (Signed)
 Ruth Brown    [id number]    June 07, 1955  Primary Care Physician:Richter, Darice CROME, MD  Referring Physician: Burney Darice CROME, MD [address] [place],  [place] [address]  Chief complaint:   RA- ILD. On azathioprine  since May 2021 Started Ofev  November 2022 Chronic Cough - Improved with treatment of GERD  HPI:  Ruth Brown is a 68 year old female with Rheumatoid Arthritis (+RF, neg CCP, ANA 1:1280) referred by Dr.Byrum after CT on 3/17 showed ILD. Patient first presented to Dr.Byrum 6 months ago for dyspnea and cough. MBS on 10/15/2018 showed patient consistent with GERD, cough improved with treatment of GERD. Patient followed by Rocky Pereyra for possible RA.  Patient's RA being treated with Remicade and Prednisone , and reports improvement in joint pain. Her dyspnea is progressively worsened over the last 6 month.   Started azathioprine  50 mg a day in May 2021 Azathioprine  dose increased to 100 mg a day in July 2021 Azathioprine  dose increased to 150 mg a day in Sept 2021 Started on Ofev  in Nov 2022.  Dose reduced to 100 mg twice daily in Feb 2023  Enrolled in [employer] pro clinical registry Has been working with GI for weight loss of unclear etiology.  CT abdomen pelvis in dec 2021 was unrevealing  Interim history: Discussed the use of AI scribe software for clinical note transcription with the patient, who gave verbal consent to proceed.  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  Exposures: No mold, hot tub, Jacuzzi.  No feather pillow comforter Smoking history: Never smoker Travel history: No significant recent travel Relevant family history: Father COPD  Outpatient Encounter Medications as of 02/21/2024  Medication Sig   albuterol  (PROVENTIL ) (2.5 MG/3ML) 0.083% nebulizer solution Take 3 mLs (2.5 mg total) by nebulization every 6 (six) hours as needed for wheezing or shortness of breath.   budesonide  (PULMICORT ) 0.5 MG/2ML nebulizer solution TAKE 2 MLS (0.5 MG TOTAL) BY NEBULIZATION 2 (TWO) TIMES DAILY.   busPIRone (BUSPAR) 10 MG tablet Take 10 mg by mouth daily.   citalopram  (CELEXA ) 40 MG tablet Take 40 mg by mouth daily.   fluticasone  (FLONASE ) 50 MCG/ACT nasal spray SPRAY 1 SPRAY INTO BOTH NOSTRILS DAILY.   hydrochlorothiazide  (HYDRODIURIL ) 12.5 MG tablet Take 12.5 mg by mouth daily.    rosuvastatin (CRESTOR) 10 MG tablet 1 tablet Orally Once a day   ARIPiprazole (ABILIFY) 2 MG tablet Take 2 mg by mouth daily.  azaTHIOprine  (IMURAN ) 50 MG tablet TAKE 3 TABLETS BY MOUTH EVERY DAY   donepezil  (ARICEPT ) 10 MG tablet TAKE 1 TABLET BY MOUTH EVERYDAY AT BEDTIME (Patient not taking: Reported on 02/21/2024)   inFLIXimab (REMICADE) 100 MG injection Inject 100 mg into the vein every 30 (thirty) days.  (Patient not taking: Reported on 02/21/2024)    Misc. Devices MISC Mask and tubing for SierraNeb 2 - Aero flow (Patient not taking: Reported on 02/21/2024)   Nintedanib (OFEV ) 100 MG CAPS Take 1 capsule (100 mg total) by mouth 2 (two) times daily.   simvastatin  (ZOCOR ) 20 MG tablet Take 20 mg by mouth daily. Takes 2 tabs (40mg ) a day (Patient not taking: Reported on 02/21/2024)   No facility-administered encounter medications on file as of 02/21/2024.   Vitals:   02/21/24 1348  BP: 110/73  Pulse: 88  Temp: 98 F (36.7 C)  Height: 5' 4 (1.626 m)  Weight: 134 lb (60.8 kg)  SpO2: 96%  TempSrc: Oral  BMI (Calculated): 22.99    Physical Exam GEN: No acute distress CV: Regular rate and rhythm no murmurs LUNGS: Clear to auscultation bilaterally normal respiratory effort SKIN JOINTS: Warm and dry no rash   Yeah Data Reviewed: Imaging: CT Chest High Resolution 06/25/2019-considerable respiratory motion.  Worsening peripheral septal thickening with reticulation, bronchiectasis.    CT abdomen 03/19/2020-stable interstitial lung disease at lung bases.  Cholelithiasis, masslike density in the outer left breast.  High-res CT 01/17/2021-redemonstrated pulmonary fibrosis with worsening compared to 2021.  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 I have reviewed the images personally.  PFTs: 08/11/2019 FVC 1.05(48%) FEV1 .91(53%) F/F 86 (109) TLC 2.06 (46%) DLCO 9.44 (54%)  03/24/2020 FVC 1.26 [59%], FEV1 1.17 [69%], F/F 93, DLCO 8.70 [49%] Severe restriction, moderate/severe diffusion defect  06/07/2021 FVC 1.49 [95%], FEV1 1.58 [96%], F/F 99, TLC 2.63 [59%], DLCO 9.93 [57%] Severe restriction, moderate diffusion defect  Labs: 10/06/2018 ANA 1:1280, notable reflex labs: RF 15 ( nl <14), CCP neg, Jo 1 -, Sjogren's antibodies negative  CK 08/11/2019-154 Aldolase 08/11/2019-8.8 Myositis panel 08/11/2019-positive PL 12 antibody TPMT 08/11/2019-17  QuantiFERON gold 714/21-negative  Outside labs reviewed on MyChart CBC 04/21/2020-WBC  2.7, hemoglobin 12.4 CMP AST 28, ALT 11  Cardiac: EF 60-65%, grade 1 diastolic dysfunction, no regional wall motion abnormalities, right atrium moderately dilated, RVP pressure mildly elevated 40.9 mmHg  Right heart catheterization 09/23/2019 Normal right heart pressures. Ao 98%, PA 71%, PA 30/14, mean 21 mm Hg; mean PCWP 9 mm Hg; CO 5.3 L/min; CI 3.3  Assessment & Plan Interstitial lung disease due to rheumatoid arthritis RF factor low end of normal and CCP negative. ANA 1:1280 convincing of rheumatologic disease.  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 No ' print production planner' on exam.  Noted to have elevated CK, aldolase and PL 12 antibody.  Interstitial lung disease likely exacerbated due to discontinuation of medications. Previous CT scan in 2022 showed progressive lung scarring. Current symptoms include increased shortness of breath. No current medications for lung disease. Insurance coverage now available for medications. - Ordered CT chest to assess current lung status - Ordered pulmonary function tests (PFTs) - Restart nintedanib - Checked liver function tests and blood counts - Will consider adding CellCept based on CT and PFT results  Rheumatoid arthritis Previous treatment including Remicade, prednisone , and azathioprine . Current symptoms include joint stiffness and pain, particularly in the knees. No current rheumatologist follow-up due to financial constraints. New rheumatologist referral to [facility] for further management. -  Referred to [facility]ologist at [facility] for further management - Will discuss potential anti-inflammatory treatment options for joints  Plan/Recommendations: Resume nintedanib 150 mg twice daily, check baseline labs Rheumatology referral High-res CT, PFTs  I personally spent a total of 35 minutes in the care of the patient today including preparing to see the patient, getting/reviewing separately obtained history, performing a  medically appropriate exam/evaluation, counseling and educating, placing orders, and coordinating care.   Lonna Coder MD [facility] 02/21/2024, 2:04 PM  CC: Burney Darice CROME, MD

## 2024-02-22 ENCOUNTER — Telehealth: Payer: Self-pay

## 2024-02-22 NOTE — Telephone Encounter (Signed)
 Referral to pharmacy team for restart Ofev . She has been on Ofev  before, self-stopped.   Benefits investigation for Ofev .

## 2024-02-22 NOTE — Telephone Encounter (Signed)
 error

## 2024-02-25 ENCOUNTER — Other Ambulatory Visit (HOSPITAL_COMMUNITY): Payer: Self-pay

## 2024-02-25 NOTE — Telephone Encounter (Signed)
 Received notification from [employer] regarding a prior authorization for OFEV . Authorization has been APPROVED from 02/25/24 to 04/09/25. Approval letter sent to scan center.  Per test claim, copay for 30 days supply is $12.15  Patient can fill through [facility]: [phone]   Authorization # [id number] Phone # [phone]

## 2024-02-25 NOTE — Telephone Encounter (Signed)
 Submitted a Prior Authorization request to [employer] for OFEV  via CoverMyMeds. Will update once we receive a response.  Key: [id number]

## 2024-02-28 ENCOUNTER — Ambulatory Visit: Attending: Pulmonary Disease

## 2024-02-28 DIAGNOSIS — J849 Interstitial pulmonary disease, unspecified: Secondary | ICD-10-CM

## 2024-02-28 MED ORDER — OFEV 150 MG PO CAPS: 150.0000 mg | ORAL_CAPSULE | Freq: Two times a day (BID) | ORAL | 3 refills | Status: AC

## 2024-02-28 NOTE — Progress Notes (Signed)
  Pharmacotherapy Clinic  Referring Provider: Dr. Theophilus  Virtual Visit via Telephone Note  I connected with Ruth Brown on 02/28/24 at 11:30 AM EST by telephone and verified that I am speaking with the correct person using two identifiers.  I discussed the limitations, risks, security and privacy concerns of performing an evaluation and management service by telephone and the availability of in person appointments. I also discussed with the patient that there may be a patient responsible charge related to this service. The patient expressed understanding and agreed to proceed.   HPI: Ruth Brown is a 68 y.o. female who presents to the pharmacotherapy clinic via telephone for education on restarting Ofev . Last seen by Dr. Theophilus on 02/21/24. She has RA-ILD.   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 Now she is looking to restart Ofev .   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   Abnormal echocardiogram    ILD (interstitial lung disease) (HCC) 06/26/2019   Dyspnea 10/07/2018   Cough 10/07/2018   Colitis 10/22/2016   Breast mass in female 10/22/2016   Essential hypertension 10/22/2016   Depression, major, recurrent, moderate (HCC) 09/07/2015    Class: Chronic   Anxiety 12/16/2014   Gonalgia 12/08/2014   Adjustment disorder with mixed anxiety and depressed mood 10/26/2014    Class: Acute   HLD (hyperlipidemia) 06/16/2014    Patient's Medications  New Prescriptions   No medications on file  Previous Medications   ALBUTEROL  (PROVENTIL ) (2.5 MG/3ML) 0.083% NEBULIZER SOLUTION    Take 3 mLs (2.5 mg total) by nebulization every 6 (six) hours as needed for wheezing or shortness of breath.   ARIPIPRAZOLE  (ABILIFY) 2 MG TABLET    Take 2 mg by mouth daily.   AZATHIOPRINE  (IMURAN ) 50 MG TABLET    TAKE 3 TABLETS BY MOUTH EVERY DAY   BUDESONIDE  (PULMICORT ) 0.5 MG/2ML NEBULIZER SOLUTION    TAKE 2 MLS (0.5 MG TOTAL) BY NEBULIZATION 2 (TWO) TIMES DAILY.   BUSPIRONE (BUSPAR) 10 MG TABLET    Take 10 mg by mouth daily.   CITALOPRAM  (CELEXA ) 40 MG TABLET    Take 40 mg by mouth daily.   DONEPEZIL  (ARICEPT ) 10 MG TABLET    TAKE 1 TABLET BY MOUTH EVERYDAY AT BEDTIME   FLUTICASONE  (FLONASE ) 50 MCG/ACT NASAL SPRAY    SPRAY 1 SPRAY INTO BOTH NOSTRILS DAILY.   HYDROCHLOROTHIAZIDE  (HYDRODIURIL ) 12.5 MG TABLET    Take 12.5 mg by mouth daily.    INFLIXIMAB (REMICADE) 100 MG INJECTION    Inject 100 mg into the vein every 30 (thirty) days.    MISC. DEVICES MISC    Mask and tubing for SierraNeb 2 - Aero flow   NINTEDANIB (OFEV ) 100 MG CAPS    Take 1 capsule (100 mg total) by mouth 2 (two) times daily.   ROSUVASTATIN (CRESTOR) 10 MG TABLET    1 tablet Orally Once a day   SIMVASTATIN  (ZOCOR ) 20 MG TABLET    Take 20 mg by mouth daily. Takes 2 tabs (40mg ) a day  Modified Medications   No medications on file  Discontinued Medications   No medications on file    Allergies: No Known Allergies  Past Medical History: Past Medical History:  Diagnosis Date   Anxiety  Arthritis    Depression    Gallstone    HTN (hypertension)    Hyperlipidemia     Social History: Social History   Socioeconomic History   Marital status: Married    Spouse name: Not on file   Number of children: Not on file   Years of education: Not on file   Highest education level: Not on file  Occupational History   Occupation: mortgage collections  Tobacco Use   Smoking status: Never   Smokeless tobacco: Never  Vaping Use   Vaping status: Never Used  Substance and Sexual Activity   Alcohol use: No    Alcohol/week: 0.0 standard drinks of alcohol   Drug use: No   Sexual activity: Not on file  Other Topics Concern   Not on file   Social History Narrative   Not on file   Social Drivers of Health   Financial Resource Strain: Not on file  Food Insecurity: Not on file  Transportation Needs: Not on file  Physical Activity: Not on file  Stress: Not on file  Social Connections: Unknown (08/20/2021)   Received from [facility]   Social Network    Social Network: Not on file    Medication: - Ofev  150mg  capsule by mouth twice daily   Assessment: Patient counseled on purpose, proper use, and potential adverse effects including diarrhea, nausea, vomiting, abdominal pain, decreased appetite, weight loss. Stressed the importance of routine lab monitoring. Will monitor LFT's every month for the first 6 months of treatment then every 3 months.   Ofev  dose will be 150 mg capsule every 12 hours with food. Stressed importance of taking with food to minimize stomach upset. Focus on adequate protein intake. May supplement with protein shakes if decreased appetite.  Plan: - START Ofev  150mg  capsule by mouth every 12 hours. Take with food.  - Rx triaged to [facility]: [phone]  - First lab results are due 4 weeks after starting medication.   I discussed the assessment and treatment plan with the patient. The patient was provided an opportunity to ask questions and all were answered. The patient agreed with the plan and demonstrated an understanding of the instructions.   The patient was advised to call back or seek an in-person evaluation if the symptoms worsen or if the condition fails to improve as anticipated.  I provided 15 minutes of non-face-to-face time during this encounter.  Aleck Puls, PharmD, BCPS, CPP Clinical Pharmacist  [facility]

## 2024-02-28 NOTE — Telephone Encounter (Signed)
 Restart Ofev  counseling completed in pharmacotherapy encounter.

## 2024-04-11 ENCOUNTER — Emergency Department (HOSPITAL_COMMUNITY)
Admission: EM | Admit: 2024-04-11 | Discharge: 2024-04-12 | Disposition: A | Discharge status: Home / Self Care | Attending: Emergency Medicine | Admitting: Emergency Medicine

## 2024-04-11 ENCOUNTER — Emergency Department (HOSPITAL_COMMUNITY)

## 2024-04-11 ENCOUNTER — Other Ambulatory Visit: Payer: Self-pay

## 2024-04-11 ENCOUNTER — Encounter (HOSPITAL_COMMUNITY): Payer: Self-pay | Admitting: Emergency Medicine

## 2024-04-11 DIAGNOSIS — Z79899 Other long term (current) drug therapy: Secondary | ICD-10-CM | POA: Insufficient documentation

## 2024-04-11 DIAGNOSIS — J849 Interstitial pulmonary disease, unspecified: Secondary | ICD-10-CM | POA: Diagnosis not present

## 2024-04-11 DIAGNOSIS — R778 Other specified abnormalities of plasma proteins: Secondary | ICD-10-CM | POA: Insufficient documentation

## 2024-04-11 DIAGNOSIS — Z7951 Long term (current) use of inhaled steroids: Secondary | ICD-10-CM | POA: Insufficient documentation

## 2024-04-11 DIAGNOSIS — Z87891 Personal history of nicotine dependence: Secondary | ICD-10-CM | POA: Insufficient documentation

## 2024-04-11 DIAGNOSIS — J441 Chronic obstructive pulmonary disease with (acute) exacerbation: Secondary | ICD-10-CM | POA: Diagnosis not present

## 2024-04-11 DIAGNOSIS — R000 Tachycardia, unspecified: Secondary | ICD-10-CM | POA: Insufficient documentation

## 2024-04-11 DIAGNOSIS — R059 Cough, unspecified: Secondary | ICD-10-CM | POA: Diagnosis present

## 2024-04-11 LAB — CBC
HCT: 44.4 % (ref 36.0–46.0)
Hemoglobin: 14.4 g/dL (ref 12.0–15.0)
MCH: 28.7 pg (ref 26.0–34.0)
MCHC: 32.4 g/dL (ref 30.0–36.0)
MCV: 88.4 fL (ref 80.0–100.0)
Platelets: 363 K/uL (ref 150–400)
RBC: 5.02 MIL/uL (ref 3.87–5.11)
RDW: 13.2 % (ref 11.5–15.5)
WBC: 6.5 K/uL (ref 4.0–10.5)
nRBC: 0 % (ref 0.0–0.2)

## 2024-04-11 LAB — RESP PANEL BY RT-PCR (RSV, FLU A&B, COVID)  RVPGX2
Influenza A by PCR: NEGATIVE
Influenza B by PCR: NEGATIVE
Resp Syncytial Virus by PCR: NEGATIVE
SARS Coronavirus 2 by RT PCR: NEGATIVE

## 2024-04-11 LAB — BASIC METABOLIC PANEL WITH GFR
Anion gap: 11 (ref 5–15)
BUN: 24 mg/dL — ABNORMAL HIGH (ref 8–23)
CO2: 34 mmol/L — ABNORMAL HIGH (ref 22–32)
Calcium: 10.2 mg/dL (ref 8.9–10.3)
Chloride: 89 mmol/L — ABNORMAL LOW (ref 98–111)
Creatinine, Ser: 0.76 mg/dL (ref 0.44–1.00)
GFR, Estimated: >60 mL/min
Glucose, Bld: 111 mg/dL — ABNORMAL HIGH (ref 70–99)
Potassium: 3.3 mmol/L — ABNORMAL LOW (ref 3.5–5.1)
Sodium: 134 mmol/L — ABNORMAL LOW (ref 135–145)

## 2024-04-11 LAB — TROPONIN T, HIGH SENSITIVITY: Troponin T High Sensitivity: 20 ng/L — ABNORMAL HIGH (ref 0–19)

## 2024-04-11 MED ORDER — IOHEXOL 350 MG/ML SOLN
60.0000 mL | Freq: Once | INTRAVENOUS | Status: AC | PRN
  Administered 2024-04-11: 60 mL via INTRAVENOUS

## 2024-04-11 MED ORDER — DOXYCYCLINE HYCLATE 100 MG PO CAPS: 100.0000 mg | ORAL_CAPSULE | Freq: Two times a day (BID) | ORAL | 0 refills | Status: AC

## 2024-04-11 MED ORDER — IPRATROPIUM-ALBUTEROL 0.5-2.5 (3) MG/3ML IN SOLN
3.0000 mL | Freq: Once | RESPIRATORY_TRACT | Status: AC
  Administered 2024-04-11: 3 mL via RESPIRATORY_TRACT
  Filled 2024-04-11: qty 3

## 2024-04-11 MED ORDER — PREDNISONE 20 MG PO TABS: 40.0000 mg | ORAL_TABLET | Freq: Every day | ORAL | 0 refills | Status: AC

## 2024-04-11 MED ORDER — METHYLPREDNISOLONE SODIUM SUCC 125 MG IJ SOLR
125.0000 mg | Freq: Once | INTRAMUSCULAR | Status: AC
  Administered 2024-04-11: 125 mg via INTRAVENOUS
  Filled 2024-04-11: qty 2

## 2024-04-11 NOTE — ED Notes (Signed)
 First poc with patient. PT resting in bed with daughter at bedside. Pt axox4. Gcs 15. Pt on continuous cardia,c, pulse ox and bp monitoring.  No respiratory distress noted.  Pt updated on plan of care.  Daughter of patient provided patient with home key for access post discharge process. Pt c/o chest pain d/t coughing per pt

## 2024-04-11 NOTE — ED Provider Notes (Signed)
 " [place] [facility] Provider Note   CSN: [id number] Arrival date & time: 04/11/24  1548     Patient presents with: Shortness of Breath   Ruth Brown is a 69 y.o. female.  Shortness of Breath Associated symptoms: cough   Patient is a sick 26-year-old female presenting ED today for concerns for shortness of breath, chest pain that has been progressing over the last 3 days.  Noted seen by PCP today and was told to come to the emergency department for evaluation.  Noted to have a previous medical history of interstitial lung disease, supposed to be on long-term oxygen 2 L but has not been using it frequently additionally has stopped taking all medications prescribed by pulmonology for interstitial lung disease.  Has not reached out to pulmonology since having stopped her medications, noting that she had only taken 1 dose before stopping.  No previous history of blood clots.  Denies fever, headache, vision changes, abdominal pain, nausea, vomiting, diarrhea, melena, hematochezia, dysuria, hematuria, rashes, lower leg swelling, long trips.     Prior to Admission medications  Medication Sig Start Date End Date Taking? Authorizing Provider  doxycycline  (VIBRAMYCIN ) 100 MG capsule Take 1 capsule (100 mg total) by mouth 2 (two) times daily for 5 days. 04/11/24 04/16/24 Yes Beola Terrall RAMAN, PA-C  predniSONE  (DELTASONE ) 20 MG tablet Take 2 tablets (40 mg total) by mouth daily for 5 days. 04/11/24 04/16/24 Yes Alp Goldwater S, PA-C  albuterol  (PROVENTIL ) (2.5 MG/3ML) 0.083% nebulizer solution Take 3 mLs (2.5 mg total) by nebulization every 6 (six) hours as needed for wheezing or shortness of breath. 09/05/18   Hall-Potvin, Brittany, PA-C  ARIPiprazole (ABILIFY) 2 MG tablet Take 2 mg by mouth daily. 04/24/21   [provider]  azaTHIOprine  (IMURAN ) 50 MG tablet TAKE 3 TABLETS BY MOUTH EVERY DAY 09/15/21   Mannam, Praveen, MD  budesonide  (PULMICORT ) 0.5 MG/2ML  nebulizer solution TAKE 2 MLS (0.5 MG TOTAL) BY NEBULIZATION 2 (TWO) TIMES DAILY. 02/22/21   Mannam, Praveen, MD  busPIRone (BUSPAR) 10 MG tablet Take 10 mg by mouth daily. 05/16/18   [provider]  citalopram  (CELEXA ) 40 MG tablet Take 40 mg by mouth daily. 03/08/21   [provider]  donepezil  (ARICEPT ) 10 MG tablet TAKE 1 TABLET BY MOUTH EVERYDAY AT BEDTIME Patient not taking: Reported on 02/21/2024 09/06/21   Gregg Lek, MD  fluticasone  (FLONASE ) 50 MCG/ACT nasal spray SPRAY 1 SPRAY INTO BOTH NOSTRILS DAILY. 07/11/21   Mannam, Praveen, MD  hydrochlorothiazide  (HYDRODIURIL ) 12.5 MG tablet Take 12.5 mg by mouth daily.  09/09/18   [provider]  inFLIXimab (REMICADE) 100 MG injection Inject 100 mg into the vein every 30 (thirty) days.  Patient not taking: Reported on 02/21/2024    [provider]  Misc. Devices MISC Mask and tubing for SierraNeb 2 - Aero flow Patient not taking: Reported on 02/21/2024 09/09/18   [provider]  Nintedanib (OFEV ) 150 MG CAPS Take 1 capsule (150 mg total) by mouth 2 (two) times daily. 02/28/24   Mannam, Praveen, MD  rosuvastatin (CRESTOR) 10 MG tablet 1 tablet Orally Once a day    [provider]  simvastatin  (ZOCOR ) 20 MG tablet Take 20 mg by mouth daily. Takes 2 tabs (40mg ) a day Patient not taking: Reported on 02/21/2024    [provider]    Allergies: Patient has no known allergies.    Review of Systems  Respiratory:  Positive for cough  and shortness of breath.   All other systems reviewed and are negative.   Updated Vital Signs BP 133/84   Pulse (!) 107   Temp 98.7 F (37.1 C) (Oral)   Resp 16   LMP  (LMP Unknown)   SpO2 96%   Physical Exam Vitals and nursing note reviewed.  Constitutional:      General: She is not in acute distress.    Appearance: Normal appearance. She is not ill-appearing or diaphoretic.  HENT:     Head: Normocephalic and atraumatic.     Mouth/Throat:      Mouth: Mucous membranes are moist.     Pharynx: Oropharynx is clear. No pharyngeal swelling or oropharyngeal exudate.  Eyes:     General: No scleral icterus.       Right eye: No discharge.        Left eye: No discharge.     Extraocular Movements: Extraocular movements intact.     Conjunctiva/sclera: Conjunctivae normal.     Pupils: Pupils are equal, round, and reactive to light.  Cardiovascular:     Rate and Rhythm: Regular rhythm. Tachycardia present.     Pulses: Normal pulses.     Heart sounds: Normal heart sounds. No murmur heard.    No friction rub. No gallop.  Pulmonary:     Effort: Pulmonary effort is normal. No respiratory distress.     Breath sounds: No stridor. Examination of the right-middle field reveals rhonchi. Examination of the left-middle field reveals rhonchi. Examination of the right-lower field reveals rhonchi. Examination of the left-lower field reveals rhonchi. Rhonchi present. No wheezing or rales.  Chest:     Chest wall: No tenderness.  Abdominal:     General: Abdomen is flat. There is no distension.     Palpations: Abdomen is soft.     Tenderness: There is no abdominal tenderness. There is no right CVA tenderness, left CVA tenderness, guarding or rebound.  Musculoskeletal:        General: No swelling, deformity or signs of injury.     Cervical back: Normal range of motion. No rigidity.     Right lower leg: No edema.     Left lower leg: No edema.  Skin:    General: Skin is warm and dry.     Findings: No bruising, erythema or lesion.  Neurological:     General: No focal deficit present.     Mental Status: She is alert and oriented to person, place, and time. Mental status is at baseline.     Sensory: No sensory deficit.     Motor: No weakness.  Psychiatric:        Mood and Affect: Mood normal.     (all labs ordered are listed, but only abnormal results are displayed) Labs Reviewed  BASIC METABOLIC PANEL WITH GFR - Abnormal; Notable for the following  components:      Result Value   Sodium 134 (*)    Potassium 3.3 (*)    Chloride 89 (*)    CO2 34 (*)    Glucose, Bld 111 (*)    BUN 24 (*)    All other components within normal limits  RESP PANEL BY RT-PCR (RSV, FLU A&B, COVID)  RVPGX2  CBC  TROPONIN T, HIGH SENSITIVITY    EKG: EKG Interpretation Date/Time:  Friday April 11 2024 16:23:33 EST Ventricular Rate:  103 PR Interval:  122 QRS Duration:  111 QT Interval:  346 QTC Calculation: 453 R Axis:   -1  Text Interpretation: Sinus tachycardia Ventricular premature complex Aberrant conduction of SV complex(es) Anterior infarct, old Confirmed by Laurice Coy [phone]) on 04/11/2024 10:23:04 PM  Radiology: CT Angio Chest PE W and/or Wo Contrast Result Date: 04/11/2024 EXAM: CTA CHEST 04/11/2024 09:38:41 PM TECHNIQUE: CTA of the chest was performed without and with the administration of 60 mL of iohexol  (OMNIPAQUE ) 350 MG/ML injection. Multiplanar reformatted images are provided for review. MIP images are provided for review. Automated exposure control, iterative reconstruction, and/or weight based adjustment of the mA/kV was utilized to reduce the radiation dose to as low as reasonably achievable. COMPARISON: 01/17/2021 CLINICAL HISTORY: Pulmonary embolism (PE) suspected, high prob. FINDINGS: PULMONARY ARTERIES: Pulmonary arteries are adequately opacified for evaluation. No acute pulmonary embolus. Main pulmonary artery is normal in caliber. MEDIASTINUM: The heart and pericardium demonstrate no acute abnormality. Scattered aortic calcifications. There is no acute abnormality of the thoracic aorta. LYMPH NODES: Scattered calcified mediastinal lymph nodes compatible with old granulomatous disease. No hilar or axillary lymphadenopathy. LUNGS AND PLEURA: Peripheral ground glass opacities and interstitial thickening compatible with chronic lung disease/fibrosis, stable. No focal consolidation or pulmonary edema. No evidence of pleural effusion or  pneumothorax. UPPER ABDOMEN: Limited images of the upper abdomen are unremarkable. SOFT TISSUES AND BONES: No acute bone or soft tissue abnormality. IMPRESSION: 1. No evidence of pulmonary embolism. 2. Stable chronic lung disease/fibrosis. 3. No acute cardiopulmonary disease. Electronically signed by: Franky Crease MD 04/11/2024 10:41 PM EST RP Workstation: [id number]   DG Chest 2 View Result Date: 04/11/2024 CLINICAL DATA:  Shortness of breath EXAM: CHEST - 2 VIEW COMPARISON:  12/31/2019, CT 01/17/2021 FINDINGS: Chronic fibrotic disease worst at the bases. No acute superimposed airspace disease, pleural effusion or pneumothorax. Stable cardiomediastinal silhouette with aortic atherosclerosis. IMPRESSION: Chronic fibrotic disease without acute superimposed airspace disease. Electronically Signed   By: Luke Bun M.D.   On: 04/11/2024 17:19    Procedures   Medications Ordered in the ED  methylPREDNISolone  sodium succinate (SOLU-MEDROL ) 125 mg/2 mL injection 125 mg (125 mg Intravenous Given 04/11/24 2257)  ipratropium-albuterol  (DUONEB) 0.5-2.5 (3) MG/3ML nebulizer solution 3 mL (3 mLs Nebulization Given 04/11/24 2257)  iohexol  (OMNIPAQUE ) 350 MG/ML injection 60 mL (60 mLs Intravenous Contrast Given 04/11/24 2134)       Medical Decision Making Amount and/or Complexity of Data Reviewed Labs: ordered. Radiology: ordered.  This patient is a 69 year old female who presents to the ED for concern of chest pain, shortness of breath with no history of interstitial lung disease, not currently taking any medications, seeing pulmonology but has not been taking her prescribed medications, seen by PCP today and told to come to emergency department.  Notably states at home with husband who reportedly continue to smoke indoors.  Chronically on 2 L of oxygen at baseline.  Noted to have mild cough with some possible sputum change.  On physical exam, patient is in no acute distress, afebrile, alert and orient x 4,  speaking in full sentences, nontachypneic.  Notably is borderline tachycardic with heart rate of low 100s.  Does have some mild rhonchi bilaterally.  With some mild rhonchi noted, provide DuoNebs and steroids.  On reevaluation noted the patient had significantly improved symptoms, no longer short of breath.  Still borderline tachycardic likely secondary to DuoNebs.   Troponin mildly elevated at 20 pending delta troponin.  CT angio negative, showing stable initial lung disease, with reassuring lab work at this time, low suspicion for any emergent cause of her symptoms.  Will send home with  steroid burst, following up with pulmonology as well as PCP.  Awaiting second troponin, patient care transferred to Olam Slocumb , PA-C.  Differential diagnoses prior to evaluation: The emergent differential diagnosis includes, but is not limited to, interstitial lung disease, ACS, PE, pneumonia, COPD exacerbation, pneumothorax, sepsis, URI. This is not an exhaustive differential.   Past Medical History / Co-morbidities / Social History: Interstitial lung disease, anxiety, depression, HTN, arthritis, HLD, connective tissue disease overlap syndrome  Additional history: Chart reviewed. Pertinent results include:   Last seen by pulmonology on 02/23/2024 for interstitial lung disease on azathioprine  since 2021, started Ofev  in 2022.  Noted to have not been taking her medications.  Restarted nintedanib.   Lab Tests/Imaging studies: I personally interpreted labs/imaging and the pertinent results include: CBC unremarkable BMP notes a mildly elevated BUN 24 with a mild hypokalemia, elevated CO2 34 and mild hyponatremia. Respiratory panel negative Chest x-ray shows chronic interstitial lung disease but otherwise unremarkable.  CT angio shows chronic interstitial disease but otherwise unremarkable  I agree with the radiologist interpretation.  Cardiac monitoring: EKG obtained and interpreted by myself and  attending physician which shows: Sinus tachycardia  EKG Interpretation Date/Time:  Friday April 11 2024 16:23:33 EST Ventricular Rate:  103 PR Interval:  122 QRS Duration:  111 QT Interval:  346 QTC Calculation: 453 R Axis:   -1  Text Interpretation: Sinus tachycardia Ventricular premature complex Aberrant conduction of SV complex(es) Anterior infarct, old Confirmed by Laurice Coy [phone]) on 04/11/2024 10:23:04 PM          Medications: I ordered medication including DuoNebs, Solu-Medrol .  I have reviewed the patients home medicines and have made adjustments as needed.  Critical Interventions: None  Social Determinants of Health: Lives at home with spouse who is a chronic smoker  Disposition: 11:49 PM Care of Ruth Brown transferred to [facility] and Dr. Theadore at the end of my shift as the patient will require reassessment once labs/imaging have resulted. Patient presentation, ED course, and plan of care discussed with review of all pertinent labs and imaging. Please see his/her note for further details regarding further ED course and disposition. Plan at time of handoff is await second trop, anticipate discharge with abx and steroids, followup with PCP and pulmonology. This may be altered or completely changed at the discretion of the oncoming team pending results of further workup.   Final diagnoses:  Interstitial lung disease (HCC)  COPD exacerbation [facility])    ED Discharge Orders          Ordered    predniSONE  (DELTASONE ) 20 MG tablet  Daily        04/11/24 2306    doxycycline  (VIBRAMYCIN ) 100 MG capsule  2 times daily        04/11/24 2307               Beola Terrall RAMAN, PA-C 04/11/24 2351    Laurice Coy BROCKS, MD 04/19/24 1437  "

## 2024-04-11 NOTE — Discharge Instructions (Addendum)
 You are seen today for his lung disease and possible COPD exacerbation with your increased shortness of breath and some chest pain and cough.  Sending in a short course of antibiotics for you to take as well as steroids.  Continue to follow-up with both pulmonology and PCP, continue to stay on oxygen.  Return to the ER for new or worsening symptoms including fever, worsening shortness of breath/chest pain, blood in cough, fainting, confusion.

## 2024-04-11 NOTE — ED Triage Notes (Addendum)
 Patient BIB EMS from Dr. Lawerance c/o SOB x 1 month. Patient reports becoming winded with ambulation and routine movement around her home. Per report Patient o2 was 94% on RA, and HR was elevated at her the Dr. Lawerance. PCP advised her to be seen for further evaluation. Patient denies Chest pain. Patient denies N/V.

## 2024-04-11 NOTE — ED Notes (Signed)
"   The pt is currently in the lobby and normally is on home o2 (2Lpm) and did not have a portable o2 to bring with her and did not inform the triage nurse . Currently, she is at 94 SPO2 (room air) and does not appear to be in distress, but was was still SHOB.  Pt was placed on a nasal cannula at 2Lpm and is stable. Charge and Triage nurse notified.  "

## 2024-04-12 ENCOUNTER — Encounter: Payer: Self-pay | Admitting: Pulmonary Disease

## 2024-04-12 LAB — TROPONIN T, HIGH SENSITIVITY: Troponin T High Sensitivity: 18 ng/L (ref 0–19)

## 2024-04-12 NOTE — ED Notes (Signed)
 Pt axox4. GCS 15. PT verbalizes understanding of discharge instructions, follow up and new rx. Pt escorted out via wheelchair by NT to transportation home with son

## 2024-04-12 NOTE — ED Provider Notes (Signed)
" °  Delta trop is negative at 18.  Remains HD stable on RA.  Appears stable for discharge with plan as per prior team.  Can follow-up with PCP.  Return here for new concerns.   Jarold Olam HERO, PA-C 04/12/24 [id number]    Theadore Ozell HERO, MD 04/12/24 [phone]  "

## 2024-04-13 ENCOUNTER — Encounter (HOSPITAL_BASED_OUTPATIENT_CLINIC_OR_DEPARTMENT_OTHER): Payer: Self-pay | Admitting: Cardiology

## 2024-04-15 NOTE — Telephone Encounter (Signed)
 Good morning, This patient was recently in the ED and her daughter sent a message wanting some issues addressed.  I advised her that these could be addressed during her office visit on 1/7.  I am just giving you a heads up so this can be addressed.  Thank you.

## 2024-04-16 ENCOUNTER — Telehealth: Payer: Self-pay

## 2024-04-16 ENCOUNTER — Encounter

## 2024-04-16 ENCOUNTER — Ambulatory Visit: Admitting: Pulmonary Disease

## 2024-04-16 ENCOUNTER — Encounter: Payer: Self-pay | Admitting: Pulmonary Disease

## 2024-04-16 NOTE — Telephone Encounter (Signed)
 Copied from CRM #[id number]. Topic: Clinical - Medical Advice >> Apr 14, 2024  2:41 PM Devaughn RAMAN wrote: Reason for CRM: Pt daughter Manon calling in regards to personal home care request forms and nebulizer and oxygen for the pt. Pt recently d/c from the hospital and requesting assistance with Home Health.- Called- No answer,lvmtcb

## 2024-04-17 ENCOUNTER — Encounter: Payer: Self-pay | Admitting: Pulmonary Disease

## 2024-04-17 ENCOUNTER — Ambulatory Visit (INDEPENDENT_AMBULATORY_CARE_PROVIDER_SITE_OTHER): Admitting: Pulmonary Disease

## 2024-04-17 VITALS — BP 122/80 | HR 90 | Temp 97.9°F | Ht 60.0 in | Wt 123.0 lb

## 2024-04-17 DIAGNOSIS — J849 Interstitial pulmonary disease, unspecified: Secondary | ICD-10-CM | POA: Diagnosis not present

## 2024-04-17 DIAGNOSIS — J310 Chronic rhinitis: Secondary | ICD-10-CM

## 2024-04-17 DIAGNOSIS — Z5181 Encounter for therapeutic drug level monitoring: Secondary | ICD-10-CM

## 2024-04-17 LAB — COMPREHENSIVE METABOLIC PANEL WITH GFR
ALT: 14 U/L (ref 3–35)
AST: 20 U/L (ref 5–37)
Albumin: 4.3 g/dL (ref 3.5–5.2)
Alkaline Phosphatase: 82 U/L (ref 39–117)
BUN: 17 mg/dL (ref 6–23)
CO2: 38 meq/L — ABNORMAL HIGH (ref 19–32)
Calcium: 10.2 mg/dL (ref 8.4–10.5)
Chloride: 92 meq/L — ABNORMAL LOW (ref 96–112)
Creatinine, Ser: 0.55 mg/dL (ref 0.40–1.20)
GFR: 95.17 mL/min
Glucose, Bld: 92 mg/dL (ref 70–99)
Potassium: 3.5 meq/L (ref 3.5–5.1)
Sodium: 134 meq/L — ABNORMAL LOW (ref 135–145)
Total Bilirubin: 0.3 mg/dL (ref 0.2–1.2)
Total Protein: 9.1 g/dL — ABNORMAL HIGH (ref 6.0–8.3)

## 2024-04-17 MED ORDER — FLUTICASONE PROPIONATE 50 MCG/ACT NA SUSP: 2.0000 | Freq: Every day | NASAL | 3 refills | Status: AC

## 2024-04-17 NOTE — Patient Instructions (Signed)
" °  VISIT SUMMARY: Today, we discussed your breathing difficulties and medication management. We also addressed your need for oxygen therapy and a handicap parking permit.  YOUR PLAN: DYSPNEA AND FUNCTIONAL LIMITATION: You have been experiencing increased shortness of breath and fatigue, which are affecting your daily activities. -We provided you with a handicap parking permit packet to help with your mobility.  MEDICATION MANAGEMENT AND ADVERSE EFFECTS: You had a recent episode of dizziness after taking Ofev  and Buspar and are concerned about potential interactions between these medications. -Resume taking Ofev  (nintedanib) as previously prescribed. -We will check with the pharmacy regarding any interactions between Ofev  and Buspar.  OXYGEN THERAPY: You have been using your husband's oxygen due to not having a personal prescription. -We have ordered an oxygen prescription for you.  UNDERLYING CONNECTIVE TISSUE DISEASE: Your interstitial lung disease may be related to rheumatoid arthritis or mixed connective tissue disease. -You have a rheumatology appointment scheduled for further evaluation. -We have ordered a Quantiferon Gold test and a hepatitis panel to evaluate for starting CellCept (mycophenolate mofetil). -We have also ordered liver function tests. -We will start you on CellCept (mycophenolate mofetil) pending the test results. -We have scheduled lung function and breathing tests to assess your condition further.                 Contains text generated by [employer].   "

## 2024-04-17 NOTE — Progress Notes (Signed)
 "              Ruth Brown    [id number]    [birth year]  Primary Care Physician:Stowe, Damian, NP  Referring Physician: Campbell Damian, NP [address] [place],  [place] [address]  Chief complaint:   RA- ILD. On azathioprine  since May 2021 Started Ofev  November 2022, resumed in November 2025 Chronic Cough - Improved with treatment of GERD  HPI:  Ruth Brown is a 69 year old female with Rheumatoid Arthritis (+RF, neg CCP, ANA 1:1280) referred by Dr.Byrum after CT on 3/17 showed ILD.  MBS on 10/15/2018 showed patient consistent with GERD, cough improved with treatment of GERD.  Patient followed by at [facility] for possible RA.  Patient's RA being treated with Remicade and Prednisone , and reports improvement in joint pain.   Started azathioprine  50 mg a day in May 2021 Azathioprine  dose increased to 100 mg a day in July 2021 Azathioprine  dose increased to 150 mg a day in Sept 2021 Eorking with GI for weight loss of unclear etiology.  CT abdomen pelvis in dec 2021 was unrevealing Started on Ofev  in Nov 2022.  Dose reduced to 100 mg twice daily in Feb 2023  Enrolled in [employer] clinical registry  2025 Lost to follow-up from 2023 and reestablished care on November 2025 at which point she was not on any medications after having discontinued azathioprine , Ofev  and other rheumatologic medications. She was restarted on Ofev  and referral made to rheumatology as she stopped follow-up with her previous rheumatologist.  Interim history: Discussed the use of AI scribe software for clinical note transcription with the patient, who gave verbal consent to proceed.  History of Present Illness Ruth Brown is a 69 year old female with interstitial lung disease who presents with medication management concerns and breathing difficulties. She is accompanied by her son, Javon Stiller.  Dyspnea and functional limitation - Increased exertional dyspnea and fatigue limiting daily  activities such as laundry and driving - Decreased independence due to breathing difficulties - Requires home care assistance for daily activities - Interested in obtaining a handicap parking permit - Unable to work in physically demanding roles due to exertional dyspnea  Medication management and adverse effects - Recent episode of dizziness in early January after taking Ofev  and Buspar - Temporarily discontinued Buspar for a few days per nurse recommendation, then restarted twice daily - Concerned about potential interactions between Ofev  and Buspar - Currently taking Ofev , Buspar, citalopram , diclofenac, albuterol , Flonase , and antihypertensive medication - Monitoring blood pressure at home  Oxygen therapy - Using husband's oxygen due to lack of personal prescription  Underlying connective tissue disease, rheumatoid arthritis - Interstitial lung disease possibly related to rheumatoid arthritis or mixed connective tissue disease - - Previously treated with Remicade, prednisone , azathioprine , and nintedanib (Ofev ) - Not currently taking these medications due to insurance issues anxious taking care of her sick husband.  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  Exposures: No mold, hot tub, Jacuzzi.  No feather pillow comforter Smoking history: Never smoker Travel history: No significant recent travel Relevant family history: Father COPD  Outpatient Encounter Medications as of 04/17/2024  Medication Sig   albuterol  (PROVENTIL ) (2.5 MG/3ML) 0.083% nebulizer solution Take 3 mLs (2.5 mg total) by nebulization every 6 (six) hours as needed for wheezing or shortness of breath.   budesonide  (PULMICORT ) 0.5  MG/2ML nebulizer solution TAKE 2 MLS (0.5 MG TOTAL) BY NEBULIZATION 2 (TWO) TIMES DAILY.   busPIRone (BUSPAR) 10 MG tablet Take 10 mg by mouth daily.    citalopram  (CELEXA ) 40 MG tablet Take 40 mg by mouth daily.   fluticasone  (FLONASE ) 50 MCG/ACT nasal spray SPRAY 1 SPRAY INTO BOTH NOSTRILS DAILY.   hydrochlorothiazide  (HYDRODIURIL ) 12.5 MG tablet Take 12.5 mg by mouth daily.    Nintedanib (OFEV ) 150 MG CAPS Take 1 capsule (150 mg total) by mouth 2 (two) times daily.   rosuvastatin (CRESTOR) 10 MG tablet 1 tablet Orally Once a day   ARIPiprazole (ABILIFY) 2 MG tablet Take 2 mg by mouth daily.   azaTHIOprine  (IMURAN ) 50 MG tablet TAKE 3 TABLETS BY MOUTH EVERY DAY   donepezil  (ARICEPT ) 10 MG tablet TAKE 1 TABLET BY MOUTH EVERYDAY AT BEDTIME (Patient not taking: Reported on 02/21/2024)   inFLIXimab (REMICADE) 100 MG injection Inject 100 mg into the vein every 30 (thirty) days.  (Patient not taking: Reported on 04/17/2024)   Misc. Devices MISC Mask and tubing for SierraNeb 2 - Aero flow (Patient not taking: Reported on 02/21/2024)   simvastatin  (ZOCOR ) 20 MG tablet Take 20 mg by mouth daily. Takes 2 tabs (40mg ) a day (Patient not taking: Reported on 02/21/2024)   No facility-administered encounter medications on file as of 04/17/2024.   Vitals:   04/17/24 1007  BP: 122/80  Pulse: 90  Temp: 97.9 F (36.6 C)  Height: 5' (1.524 m)  Weight: 123 lb (55.8 kg)  SpO2: 95%  TempSrc: Oral  BMI (Calculated): 24.02    Physical Exam GEN: No acute distress CV: Regular rate and rhythm no murmurs LUNGS: Clear to auscultation bilaterally normal respiratory effort SKIN JOINTS: Warm and dry no rash   Yeah Data Reviewed: Imaging: CT Chest High Resolution 06/25/2019-considerable respiratory motion.  Worsening peripheral septal thickening with reticulation, bronchiectasis.    CT abdomen 03/19/2020-stable interstitial lung disease at lung bases.  Cholelithiasis, masslike density in the outer left breast.  High-res CT 01/17/2021-redemonstrated pulmonary fibrosis with worsening compared to 2021.  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  CTA 04/11/2024-no  evidence of pulmonary embolism.  Bilateral pulmonary fibrosis I have reviewed the images personally.  PFTs: 08/11/2019 FVC 1.05(48%) FEV1 .91(53%) F/F 86 (109) TLC 2.06 (46%) DLCO 9.44 (54%)  03/24/2020 FVC 1.26 [59%], FEV1 1.17 [69%], F/F 93, DLCO 8.70 [49%] Severe restriction, moderate/severe diffusion defect  06/07/2021 FVC 1.49 [95%], FEV1 1.58 [96%], F/F 99, TLC 2.63 [59%], DLCO 9.93 [57%] Severe restriction, moderate diffusion defect  Labs: 10/06/2018 ANA 1:1280, notable reflex labs: RF 15 ( nl <14), CCP neg, Jo 1 -, Sjogren's antibodies negative  CK 08/11/2019-154 Aldolase 08/11/2019-8.8 Myositis panel 08/11/2019-positive PL 12 antibody TPMT 08/11/2019-17  QuantiFERON gold 714/21-negative  Outside labs reviewed on MyChart CBC 04/21/2020-WBC 2.7, hemoglobin 12.4 CMP AST 28, ALT 11  Cardiac: EF 60-65%, grade 1 diastolic dysfunction, no regional wall motion abnormalities, right atrium moderately dilated, RVP pressure mildly elevated 40.9 mmHg  Right heart catheterization 09/23/2019 Normal right heart pressures. Ao 98%, PA 71%, PA 30/14, mean 21 mm Hg; mean PCWP 9 mm Hg; CO 5.3 L/min; CI 3.3  Assessment & Plan Interstitial lung disease likely secondary to connective tissue disease RF factor low end of normal and CCP negative. ANA 1:1280 convincing of rheumatologic disease.  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 No ' print production planner' on exam.  Noted to have elevated CK, aldolase and PL 12 antibody.  Has been  off medication for several years and interstitial lung disease likely exacerbated due to discontinuation of medications. Symptoms include dyspnea and fatigue with normal activities. Recent hospitalization due to dizziness and hypoxia. Currently she is restarted on Ofev  (nintedanib) with recent discontinuation due to concerns about interactions with Buspar. Rheumatology appointment scheduled for further evaluation and confirmation of  diagnosis.  She did not desat on exertion today  - Resume Ofev  (nintedanib) as previously prescribed. - Will check with pharmacy regarding interaction between Ofev  and Buspar. - Scheduled lung function test and breathing test. - Ordered Quantiferon Gold test and hepatitis panel to evaluate for CellCept (mycophenolate mofetil) initiation. - Ordered liver function tests. - Can consider starting CellCept (mycophenolate mofetil) pending test results.  Will wait for rheumatology evaluation first. - Provided handicap parking permit packet.  Plan/Recommendations: Restart nintedanib Resume BuSpar if no interactions found with nintedanib per pharmacy Schedule PFTs, high-res CT  I personally spent a total of 45 minutes in the care of the patient today including preparing to see the patient, getting/reviewing separately obtained history, performing a medically appropriate exam/evaluation, placing orders, documenting clinical information in the EHR, independently interpreting results, and communicating results.   Alvaro Aungst MD [facility] 04/17/2024, 10:17 AM  CC: Campbell Reynolds, NP  "

## 2024-04-18 LAB — HEPATITIS PANEL, ACUTE
Hep A IgM: NONREACTIVE
Hep B C IgM: NONREACTIVE
Hepatitis B Surface Ag: NONREACTIVE
Hepatitis C Ab: NONREACTIVE

## 2024-04-20 LAB — QUANTIFERON-TB GOLD PLUS
Mitogen-NIL: 2.17 [IU]/mL
NIL: 0.01 [IU]/mL
QuantiFERON-TB Gold Plus: NEGATIVE
TB1-NIL: 0 [IU]/mL
TB2-NIL: 0 [IU]/mL

## 2024-04-21 ENCOUNTER — Inpatient Hospital Stay: Admission: RE | Admit: 2024-04-21 | Discharge: 2024-04-21 | Attending: Pulmonary Disease

## 2024-04-21 DIAGNOSIS — J849 Interstitial pulmonary disease, unspecified: Secondary | ICD-10-CM

## 2024-04-21 DIAGNOSIS — J310 Chronic rhinitis: Secondary | ICD-10-CM

## 2024-04-21 NOTE — Telephone Encounter (Signed)
 Copied from CRM #[id number]. Topic: Clinical - Order For Equipment >> Apr 18, 2024  2:29 PM Devaughn RAMAN wrote: Reason for CRM: Pt calling in regards to oxygen order, pt stated she has not heard from anyone regarding oxygen.  Informed pt walk test showed she did not need O2. She verbalized understanding  She  got nebulizer machine and treatment has helped with breathing NFN

## 2024-04-22 ENCOUNTER — Ambulatory Visit: Payer: Self-pay | Admitting: Pulmonary Disease

## 2024-04-22 NOTE — Progress Notes (Signed)
 "  Office Visit Note  Patient: Ruth Brown             Date of Birth: [birth year]           MRN: [id number]             PCP: Campbell Reynolds, NP Referring: Mannam, Praveen, MD Visit Date: 05/02/2024 Occupation: Data Unavailable  Subjective:  New Patient (Initial Visit) (Known history of RA and ILD. Previously saw Dr. Jon Jacob. Patient states she was on Imuran  and Remicade. )   Discussed the use of AI scribe software for clinical note transcription with the patient, who gave verbal consent to proceed.  History of Present Illness Ruth Brown is a 69 year old female with rheumatoid arthritis who presents with joint pain and stiffness. She was referred by Dr. Arvel for further evaluation of her joint pain and potential lung involvement.  She has a long-standing history of rheumatoid arthritis, diagnosed approximately 15 years ago, with persistent joint pain and stiffness, particularly in her hands and knees. Symptoms include morning stiffness, cracking, and soreness. Her hands become cold and change color in the cold, accompanied by tingling and stiffness. Her fingertips and toes get cold quickly, and she describes her joints as 'kind of stiff in the morning.'  Her medication history includes previous use of Remicade and Imuran  (azathioprine ), which she stopped around the time of the COVID-19 pandemic due to financial and logistical issues. Currently, she uses lidocaine  for pain relief and takes aspirin. She has not received any injections since her last Remicade treatment.  She reports weakness, particularly when getting up, and uses a cane for support. She is cautious about her mobility, especially after her husband's hospitalization in 2022, which added to her stress. She has accepted the need for mobility aids and is more careful than a year ago.  Her family history includes her mother having rheumatoid arthritis. She has a supportive family, including a son who assists her, and  a daughter who lives nearby.  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 She lives in [place] and has a supportive family network.     Activities of Daily Living:  Patient reports morning stiffness for 20-30 minutes.   Patient Reports nocturnal pain.  Difficulty dressing/grooming: Reports Difficulty climbing stairs: Reports Difficulty getting out of chair: Reports Difficulty using hands for taps, buttons, cutlery, and/or writing: Reports  Review of Systems  Constitutional:  Positive for fatigue.  HENT:  Positive for mouth dryness. Negative for mouth sores.   Eyes:  Negative for dryness.  Respiratory:  Positive for shortness of breath.   Cardiovascular:  Positive for chest pain and palpitations.  Gastrointestinal:  Positive for constipation. Negative for blood in stool and diarrhea.  Endocrine: Negative for increased urination.  Genitourinary:  Positive for involuntary urination.  Musculoskeletal:  Positive for joint pain, gait problem, joint pain, myalgias, muscle weakness, morning stiffness, muscle tenderness and myalgias. Negative for joint swelling.  Skin:  Negative for color change, rash, hair loss and sensitivity to sunlight.  Allergic/Immunologic: Negative for susceptible to infections.  Neurological:  Positive for dizziness. Negative for headaches.  Hematological:  Negative for swollen glands.  Psychiatric/Behavioral:  Positive for depressed mood and sleep disturbance. The patient is nervous/anxious.     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   Abnormal echocardiogram    ILD (interstitial lung disease) (HCC) 06/26/2019   Dyspnea 10/07/2018   Cough 10/07/2018   Colitis 10/22/2016   Breast mass  in female 10/22/2016   Essential hypertension 10/22/2016   Depression, major, recurrent, moderate (HCC) 09/07/2015    Class: Chronic   Anxiety 12/16/2014   Gonalgia 12/08/2014   Adjustment disorder with mixed anxiety and depressed mood 10/26/2014    Class: Acute   HLD (hyperlipidemia) 06/16/2014    Past Medical History:  Diagnosis Date   Anxiety    Arthritis    Depression    Gallstone    HTN (hypertension)    Hyperlipidemia    ILD (interstitial lung disease) (HCC)    Rheumatoid arthritis (HCC)     Family History  Problem Relation Age of Onset   Depression Mother    Anxiety disorder Mother    Alcohol abuse Mother    Depression Father    Alcohol abuse Father    COPD Father    Heart disease Father    Hypertension Sister    Diabetes Sister    Prostate cancer Brother    Alcohol abuse Brother    Alcohol abuse Brother    Depression Maternal Grandmother    Depression Maternal Grandfather    Depression Paternal Grandmother    Depression Paternal Grandfather    Depression Maternal Aunt    Anxiety disorder Maternal Aunt    Endometriosis Daughter    Depression Daughter    Past Surgical History:  Procedure Laterality Date   CESAREAN SECTION     RIGHT HEART CATH N/A 09/23/2019   Procedure: RIGHT HEART CATH;  Surgeon: Dann Candyce RAMAN, MD;  Location: [facility];  Service: Cardiovascular;  Laterality: N/A;   Social History[1] Social History   Social History Narrative   Not on file     Immunization History  Administered Date(s) Administered   Influenza,inj,Quad PF,6+ Mos 02/21/2016, 02/18/2020   Influenza-Unspecified 02/12/2017   Moderna Covid-19 Fall Seasonal Vaccine 65yrs & older 08/21/2023   PFIZER(Purple Top)SARS-COV-2 Vaccination 07/17/2019, 08/11/2019, 02/18/2020   Pfizer Covid-19 Vaccine Bivalent Booster 52yrs & up 02/02/2021   Tdap 02/21/2016     Objective: Vital Signs: BP 110/74 (BP Location: Left Arm, Patient Position: Sitting, Cuff Size:  Small)   Pulse 87   Temp 97.8 F (36.6 C)   Resp 13   Ht 4' 11.75 (1.518 m)   Wt 126 lb (57.2 kg)   LMP  (LMP Unknown)   BMI 24.81 kg/m    Physical Exam Vitals and nursing note reviewed.  HENT:     Head: Normocephalic and atraumatic.     Nose: Nose normal.  Eyes:     Conjunctiva/sclera: Conjunctivae normal.     Pupils: Pupils are equal, round, and reactive to light.  Pulmonary:     Effort: Pulmonary effort is normal. No respiratory distress.  Skin:    General: Skin is warm and dry.  Neurological:     Mental Status: She is alert. Mental status is at baseline.  Psychiatric:        Mood and Affect: Mood normal.        Behavior: Behavior normal.      Musculoskeletal Exam:   CDAI Exam: CDAI Score: -- Patient Global: --; Provider Global: -- Swollen: 0 ; Tender: 0  Joint Exam 05/02/2024   All documented joints were normal     Investigation: No additional findings.  Imaging: CT CHEST HIGH RESOLUTION Result Date: 04/21/2024 EXAM: HIGH RESOLUTION CHEST 04/21/2024 11:44:57 AM TECHNIQUE: CT of the chest was performed without the administration of intravenous contrast. High resolution CT imaging was performed of the lungs. Multiplanar reformatted images are provided for review. Automated exposure control, iterative reconstruction, and/or weight based adjustment of the mA/kV was utilized to reduce the radiation dose to as low as reasonably achievable. High resolution CT images were performed in the supine inspiration, supine expiration, and prone inspiration positions. COMPARISON: CT Chest High Resolution 01/17/2021; 06/25/2019. Chest CT angiogram dated 05/05/2021. CLINICAL HISTORY: ILD, Dyspnea, COPD. FINDINGS: MEDIASTINUM AND LYMPH NODES: Heart: Left anterior descending coronary atherosclerosis. VASCULATURE: Aorta: Atherosclerotic nonaneurysmal thoracic aorta. Pulmonary Artery: Normal caliber main pulmonary artery. Esophagus: Mildly patulous thoracic esophagus with small layering  fluid level. Lymph Nodes: Coarsely calcified nonenlarged subcarinal and right hilar nodes, unchanged, compatible with prior granulomatous disease. No pathologically enlarged axillary nodule, mediastinal or discrete hilar nodes on these noncontrast images. HRCT FINDINGS AND LUNGS AND PLEURA: No pneumothorax. No pleural effusion. The central airways are clear. No acute consolidative airspace disease. No lung masses. No significant pulmonary nodules. Extensive patchy peripheral reticulation throughout both lungs with associated ground glass opacity, moderate traction bronchiectasis, architectural distortion and volume loss. There is a strong basilar predominance to these findings. No frank honeycombing. Findings have minimally progressed since 2022 high resolution chest CT. No significant lobular air trapping or evidence of tracheobronchomalacia on the expiration sequence. UPPER ABDOMEN: Cholelithiasis. SOFT TISSUES AND BONES: Moderate thoracic spondylosis. No acute abnormality of the soft tissues. IMPRESSION: 1. 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 Differential includes fibrotic NSIP or UIP. Given the presence of patulous esophagus and significant groundglass component, consider fibrotic NSIP due to underlying connective tissue disease / scleroderma. Findings are indeterminate for UIP per consensus guidelines: Diagnosis of idiopathic pulmonary fibrosis: An official ats/ers/jrs/alat clinical practice guideline. Am j respir crit care med vol 198, iss 5, [id number], Dec 09 2016. 2. Left anterior descending coronary atherosclerosis. 3. Cholelithiasis. 4. Aortic Atherosclerosis (ICD10-I70.0). Electronically signed by: Selinda Blue MD MD 04/21/2024 12:48 PM EST RP Workstation: [id number]   CT Angio Chest PE W and/or Wo Contrast Result Date: 04/11/2024 EXAM: CTA CHEST 04/11/2024  09:38:41 PM TECHNIQUE: CTA of the chest was performed without and with the administration of 60 mL of iohexol  (OMNIPAQUE ) 350 MG/ML injection. Multiplanar reformatted images are provided for review. MIP images are provided for review. Automated exposure control, iterative reconstruction, and/or weight based adjustment of the mA/kV was utilized to reduce the radiation dose to as low as reasonably achievable. COMPARISON: 01/17/2021 CLINICAL HISTORY: Pulmonary embolism (PE) suspected, high prob. FINDINGS: PULMONARY ARTERIES: Pulmonary arteries are adequately opacified for evaluation. No acute pulmonary embolus. Main pulmonary artery is normal in caliber. MEDIASTINUM: The heart and pericardium demonstrate no acute abnormality. Scattered aortic calcifications. There is no acute abnormality of the thoracic aorta. LYMPH NODES: Scattered calcified mediastinal lymph nodes compatible with old granulomatous disease. No hilar or axillary lymphadenopathy. LUNGS AND PLEURA: Peripheral ground glass opacities and interstitial thickening compatible with chronic lung disease/fibrosis, stable. No focal consolidation or pulmonary edema. No evidence of pleural effusion or pneumothorax. UPPER ABDOMEN: Limited images of the upper abdomen are unremarkable. SOFT TISSUES AND BONES: No acute bone or soft tissue abnormality. IMPRESSION: 1. No evidence of pulmonary embolism. 2. Stable chronic lung disease/fibrosis. 3. No acute cardiopulmonary disease. Electronically signed by: Franky Crease MD 04/11/2024 10:41 PM  EST RP Workstation: [id number]   DG Chest 2 View Result Date: 04/11/2024 CLINICAL DATA:  Shortness of breath EXAM: CHEST - 2 VIEW COMPARISON:  12/31/2019, CT 01/17/2021 FINDINGS: Chronic fibrotic disease worst at the bases. No acute superimposed airspace disease, pleural effusion or pneumothorax. Stable cardiomediastinal silhouette with aortic atherosclerosis. IMPRESSION: Chronic fibrotic disease without acute superimposed airspace  disease. Electronically Signed   By: Luke Bun M.D.   On: 04/11/2024 17:19    Recent Labs: Lab Results  Component Value Date   WBC 6.5 04/11/2024   HGB 14.4 04/11/2024   PLT 363 04/11/2024   NA 134 (L) 04/17/2024   K 3.5 04/17/2024   CL 92 (L) 04/17/2024   CO2 38 (H) 04/17/2024   GLUCOSE 92 04/17/2024   BUN 17 04/17/2024   CREATININE 0.55 04/17/2024   BILITOT 0.3 04/17/2024   ALKPHOS 82 04/17/2024   AST 20 04/17/2024   ALT 14 04/17/2024   PROT 9.1 (H) 04/17/2024   ALBUMIN 4.3 04/17/2024   CALCIUM 10.2 04/17/2024   GFRAA 116 05/05/2019   QFTBGOLDPLUS NEGATIVE 04/17/2024    Speciality Comments: No specialty comments available.  Procedures:  No procedures performed Allergies: Patient has no known allergies.    Assessment / Plan:     Visit Diagnoses:  ILD (interstitial lung disease) (HCC) Positive ANA (antinuclear antibody)  Elevated CK Mildly positive RF Patient with very high titer positive ANA (1:1280) w/ hx of positive CK, aldolase, weak positive PL-12 abs, and mildly positive RF. 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 Will repeat CK, Aldolase, Myositis Specific II Antibodies Panel, Rheumatoid Factor, Cyclic citrul peptide antibody, IgG today.  However, as discussed with patient, first line treatment for RA-ILD or myositis associated ILD would be cellcept . Will start CellCept  500 mg BID for 14 days, then increase to 1000 mg BID for 14 days, then 1500 mg BID. Patient instructed they will need lab work q14 days while adjusting dose then q3 months when on a stable dose.  Discussed with patient that MMX lowers immunity and increases risk of infections. Patient was advised to hold medication if significantly ill or on an antibiotic. Discussed side effects including, but not limited to, increased risk for severe infections, nausea, and upset stomach. Other possible side effects include  headaches, dizziness, difficulty sleeping, tremor, or rash. Mycophenolate  has also been associated with lymphomas and skin cancers. Also discussed that pregnant women or those wishing to become pregnant should not take MMF, as it can cause birth defects. Birth control should be used in women of child-bearing age while taking this medication.  QuantiFERON and hepatitis will be checked. ACR handout given to patient to review and encouraged to ask questions.   Advised patient continue regularly scheduled follow-ups with pulmonology.   High risk medication use Patient starting MMF as discussed above. Baseline toxicity labs obtained 04/2024, next due as discussed above.  Orders: Orders Placed This Encounter  Procedures   CK   Aldolase   Myositis Specific II Antibodies Panel   Rheumatoid Factor   Cyclic citrul peptide antibody, IgG   Meds ordered this encounter  Medications   mycophenolate  (CELLCEPT ) 500 MG tablet    Sig: Take 1 tablet (500 mg total) by mouth 2 (two) times daily for 14 days, THEN 2 tablets (1,000 mg total) 2 (two) times daily for 14 days, THEN 3 tablets (1,500 mg total) 2 (two) times daily.  Dispense:  444 tablet    Refill:  0   I personally spent a total of 60 minutes in the care of the patient today including preparing to see the patient, getting/reviewing separately obtained history, performing a medically appropriate exam/evaluation, counseling and educating, placing orders, documenting clinical information in the EHR, independently interpreting results, and communicating results.   Follow-Up Instructions: Return in about 2 months (around 06/30/2024).   Asberry Claw, DO  Note - This record has been created using Animal nutritionist.  Chart creation errors have been sought, but may not always  have been located. Such creation errors do not reflect on  the standard of medical care.     [1]  Social History Tobacco Use   Smoking status: Never    Passive exposure:  Current   Smokeless tobacco: Never  Vaping Use   Vaping status: Never Used  Substance Use Topics   Alcohol use: No    Alcohol/week: 0.0 standard drinks of alcohol   Drug use: No   "

## 2024-05-02 ENCOUNTER — Ambulatory Visit

## 2024-05-02 VITALS — BP 110/74 | HR 87 | Temp 97.8°F | Resp 13 | Ht 59.75 in | Wt 126.0 lb

## 2024-05-02 DIAGNOSIS — R7689 Other specified abnormal immunological findings in serum: Secondary | ICD-10-CM | POA: Diagnosis not present

## 2024-05-02 DIAGNOSIS — J849 Interstitial pulmonary disease, unspecified: Secondary | ICD-10-CM

## 2024-05-02 DIAGNOSIS — M609 Myositis, unspecified: Secondary | ICD-10-CM | POA: Diagnosis not present

## 2024-05-02 MED ORDER — MYCOPHENOLATE MOFETIL 500 MG PO TABS: ORAL_TABLET | ORAL | 0 refills | Status: AC

## 2024-05-02 NOTE — Patient Instructions (Signed)
 Please start Cellcept  500mg , twice a day, for 14 days. PLEASE GET LAB WORK AT THIS TIME. Please start Cellcept  1000mg , twice a day, for 14 days. PLEASE GET LAB WORK AT THIS TIME. Please start Cellcept  1500mg , twice a day. You will continue this dose until you see me at your follow-up visit.

## 2024-05-06 ENCOUNTER — Telehealth: Payer: Self-pay

## 2024-05-06 NOTE — Telephone Encounter (Signed)
 Copied from CRM #[id number]. Topic: Clinical - Medication Refill >> May 05, 2024  4:29 PM Rilla B wrote: Medication: budesonide  (PULMICORT ) 0.5 MG/2ML nebulizer solution albuterol  (PROVENTIL ) (2.5 MG/3ML) 0.083% nebulizer solution  Has the patient contacted their pharmacy? No (Agent: If no, request that the patient contact the pharmacy for the refill. If patient does not wish to contact the pharmacy document the reason why and proceed with request.)  ** [place] [employer] [place]por[place] and left medication at home.  (Agent: If yes, when and what did the pharmacy advise?)  This is the patient's preferred pharmacy:   [facility] [address] [place], [place] [address] [phone]  Is this the correct pharmacy for this prescription? Yes If no, delete pharmacy and type the correct one.   Has the prescription been filled recently? Yes  Is the patient out of the medication? Yes * Patient out of town and left medication at home.   Has the patient been seen for an appointment in the last year OR does the patient have an upcoming appointment? Yes  Can we respond through MyChart? Yes  Agent: Please be advised that Rx refills may take up to 3 business days. We ask that you follow-up with your pharmacy.

## 2024-05-07 LAB — RHEUMATOID FACTOR: Rheumatoid fact SerPl-aCnc: <10 [IU]/mL

## 2024-05-07 LAB — MYOSITIS SPECIFIC II ANTIBODIES PANEL
EJ AB: <11 SI
JO-1 AB: <11 SI
MDA-5 AB: <11 SI
MI-2 ALPHA AB: <11 SI
MI-2 BETA AB: <11 SI
NXP-2 AB: <11 SI
OJ AB: <11 SI
PL-12 AB: <11 SI
PL-7 AB: <11 SI
SRP-AB: <11 SI
TIF-1y AB: <11 SI

## 2024-05-07 LAB — CYCLIC CITRUL PEPTIDE ANTIBODY, IGG: Cyclic Citrullin Peptide Ab: <16 U

## 2024-05-07 LAB — CK: Total CK: 67 U/L (ref 20–243)

## 2024-05-07 LAB — ALDOLASE: Aldolase: 4.3 U/L

## 2024-05-15 ENCOUNTER — Telehealth: Payer: Self-pay

## 2024-05-15 MED ORDER — BUDESONIDE 0.5 MG/2ML IN SUSP: 2.0000 mL | Freq: Two times a day (BID) | RESPIRATORY_TRACT | 1 refills | Status: AC

## 2024-05-15 NOTE — Telephone Encounter (Signed)
 Rx sent to pharmacy and pt notified    Copied from CRM [id number]. Topic: Clinical - Prescription Issue >> May 14, 2024  4:01 PM Joesph PARAS wrote: Reason for CRM: Patient is requesting that budesonide  (PULMICORT ) 0.5 MG/2ML [facility]izer solution [facility], along with the rest of her medications when they are due to refills. Patient is requesting we switch this over. Only needs PULMICORT  filled now. Please update patient when this has been completed.

## 2024-05-28 ENCOUNTER — Ambulatory Visit: Admitting: Pulmonary Disease

## 2024-07-01 ENCOUNTER — Ambulatory Visit

## 2024-07-22 ENCOUNTER — Ambulatory Visit: Admitting: Pulmonary Disease

## 2024-07-22 ENCOUNTER — Encounter
